# Patient Record
Sex: Female | Born: 1950 | Race: White | Hispanic: No | Marital: Married | Smoking: Former smoker
Health system: Southern US, Community
[De-identification: ages and names within clinical notes are randomized; demographics above are authoritative.]

## PROBLEM LIST (undated history)

## (undated) DIAGNOSIS — R3915 Urgency of urination: Secondary | ICD-10-CM

## (undated) DIAGNOSIS — IMO0001 Reserved for inherently not codable concepts without codable children: Secondary | ICD-10-CM

## (undated) DIAGNOSIS — M5136 Other intervertebral disc degeneration, lumbar region: Secondary | ICD-10-CM

## (undated) DIAGNOSIS — I10 Essential (primary) hypertension: Secondary | ICD-10-CM

## (undated) DIAGNOSIS — F419 Anxiety disorder, unspecified: Secondary | ICD-10-CM

## (undated) DIAGNOSIS — Z9989 Dependence on other enabling machines and devices: Secondary | ICD-10-CM

## (undated) DIAGNOSIS — D494 Neoplasm of unspecified behavior of bladder: Secondary | ICD-10-CM

## (undated) DIAGNOSIS — M51369 Other intervertebral disc degeneration, lumbar region without mention of lumbar back pain or lower extremity pain: Secondary | ICD-10-CM

## (undated) DIAGNOSIS — IMO0002 Reserved for concepts with insufficient information to code with codable children: Secondary | ICD-10-CM

## (undated) DIAGNOSIS — M1990 Unspecified osteoarthritis, unspecified site: Secondary | ICD-10-CM

## (undated) DIAGNOSIS — G4733 Obstructive sleep apnea (adult) (pediatric): Secondary | ICD-10-CM

## (undated) DIAGNOSIS — Z5189 Encounter for other specified aftercare: Secondary | ICD-10-CM

## (undated) HISTORY — PX: ANTERIOR CRUCIATE LIGAMENT REPAIR: SHX115

## (undated) HISTORY — PX: TONSILLECTOMY: SUR1361

## (undated) HISTORY — DX: Unspecified osteoarthritis, unspecified site: M19.90

## (undated) HISTORY — PX: GANGLION CYST EXCISION: SHX1691

## (undated) HISTORY — PX: TUBAL LIGATION: SHX77

## (undated) HISTORY — PX: DILATION AND CURETTAGE OF UTERUS: SHX78

## (undated) HISTORY — DX: Essential (primary) hypertension: I10

---

## 1992-10-14 HISTORY — PX: ABDOMINAL HYSTERECTOMY: SHX81

## 1998-12-13 ENCOUNTER — Other Ambulatory Visit: Admission: RE | Admit: 1998-12-13 | Discharge: 1998-12-13 | Payer: Self-pay | Admitting: Obstetrics and Gynecology

## 2002-04-23 ENCOUNTER — Encounter (INDEPENDENT_AMBULATORY_CARE_PROVIDER_SITE_OTHER): Payer: Self-pay | Admitting: *Deleted

## 2002-04-23 ENCOUNTER — Ambulatory Visit (HOSPITAL_COMMUNITY): Admission: RE | Admit: 2002-04-23 | Discharge: 2002-04-23 | Payer: Self-pay | Admitting: Gastroenterology

## 2002-11-11 ENCOUNTER — Other Ambulatory Visit: Admission: RE | Admit: 2002-11-11 | Discharge: 2002-11-11 | Payer: Self-pay | Admitting: Obstetrics and Gynecology

## 2004-06-04 ENCOUNTER — Observation Stay (HOSPITAL_COMMUNITY): Admission: EM | Admit: 2004-06-04 | Discharge: 2004-06-05 | Payer: Self-pay | Admitting: Emergency Medicine

## 2004-08-10 DIAGNOSIS — IMO0001 Reserved for inherently not codable concepts without codable children: Secondary | ICD-10-CM

## 2004-08-10 HISTORY — DX: Reserved for inherently not codable concepts without codable children: IMO0001

## 2006-02-20 ENCOUNTER — Ambulatory Visit (HOSPITAL_COMMUNITY): Admission: RE | Admit: 2006-02-20 | Discharge: 2006-02-20 | Payer: Self-pay | Admitting: Orthopedic Surgery

## 2006-02-20 HISTORY — PX: KNEE ARTHROSCOPY W/ MENISCECTOMY: SHX1879

## 2006-06-15 ENCOUNTER — Emergency Department (HOSPITAL_COMMUNITY): Admission: EM | Admit: 2006-06-15 | Discharge: 2006-06-15 | Payer: Self-pay | Admitting: Emergency Medicine

## 2007-09-16 ENCOUNTER — Inpatient Hospital Stay (HOSPITAL_COMMUNITY): Admission: RE | Admit: 2007-09-16 | Discharge: 2007-09-21 | Payer: Self-pay | Admitting: Orthopedic Surgery

## 2007-09-16 HISTORY — PX: TOTAL KNEE ARTHROPLASTY: SHX125

## 2008-12-23 ENCOUNTER — Ambulatory Visit (HOSPITAL_COMMUNITY): Admission: RE | Admit: 2008-12-23 | Discharge: 2008-12-24 | Payer: Self-pay | Admitting: Orthopedic Surgery

## 2008-12-23 HISTORY — PX: ROTATOR CUFF REPAIR: SHX139

## 2010-04-02 HISTORY — PX: KNEE ARTHROSCOPY: SUR90

## 2010-05-01 ENCOUNTER — Ambulatory Visit (HOSPITAL_COMMUNITY): Admission: RE | Admit: 2010-05-01 | Discharge: 2010-05-01 | Payer: Self-pay | Admitting: Orthopedic Surgery

## 2010-12-30 LAB — URINALYSIS, ROUTINE W REFLEX MICROSCOPIC
Bilirubin Urine: NEGATIVE
Hgb urine dipstick: NEGATIVE
Ketones, ur: NEGATIVE mg/dL
Nitrite: NEGATIVE
Urobilinogen, UA: 0.2 mg/dL (ref 0.0–1.0)

## 2010-12-30 LAB — COMPREHENSIVE METABOLIC PANEL
Albumin: 3.8 g/dL (ref 3.5–5.2)
CO2: 24 mEq/L (ref 19–32)
Calcium: 9.5 mg/dL (ref 8.4–10.5)
Chloride: 108 mEq/L (ref 96–112)
GFR calc Af Amer: >60 mL/min (ref >60)
Glucose, Bld: 119 mg/dL — ABNORMAL HIGH (ref 70–99)
Potassium: 3.7 mEq/L (ref 3.5–5.1)
Total Bilirubin: 0.2 mg/dL — ABNORMAL LOW (ref 0.3–1.2)
Total Protein: 6.6 g/dL (ref 6.0–8.3)

## 2010-12-30 LAB — CBC
HCT: 35.7 % — ABNORMAL LOW (ref 36.0–46.0)
MCH: 30.8 pg (ref 26.0–34.0)
MCV: 90.5 fL (ref 78.0–100.0)
RBC: 3.94 MIL/uL (ref 3.87–5.11)
RDW: 12.5 % (ref 11.5–15.5)

## 2010-12-30 LAB — PROTIME-INR: INR: 1 (ref 0.00–1.49)

## 2010-12-30 LAB — DIFFERENTIAL
Basophils Absolute: 0 10*3/uL (ref 0.0–0.1)
Basophils Relative: 0 % (ref 0–1)
Monocytes Absolute: 0.4 10*3/uL (ref 0.1–1.0)
Neutro Abs: 3.5 10*3/uL (ref 1.7–7.7)

## 2010-12-30 LAB — SURGICAL PCR SCREEN: MRSA, PCR: NEGATIVE

## 2011-01-24 ENCOUNTER — Emergency Department (INDEPENDENT_AMBULATORY_CARE_PROVIDER_SITE_OTHER): Payer: BC Managed Care – HMO

## 2011-01-24 ENCOUNTER — Emergency Department (HOSPITAL_BASED_OUTPATIENT_CLINIC_OR_DEPARTMENT_OTHER)
Admission: EM | Admit: 2011-01-24 | Discharge: 2011-01-24 | Disposition: A | Discharge status: Home / Self Care | Payer: BC Managed Care – HMO | Attending: Emergency Medicine | Admitting: Emergency Medicine

## 2011-01-24 DIAGNOSIS — Z8739 Personal history of other diseases of the musculoskeletal system and connective tissue: Secondary | ICD-10-CM | POA: Insufficient documentation

## 2011-01-24 DIAGNOSIS — M25519 Pain in unspecified shoulder: Secondary | ICD-10-CM | POA: Insufficient documentation

## 2011-01-24 DIAGNOSIS — I10 Essential (primary) hypertension: Secondary | ICD-10-CM | POA: Insufficient documentation

## 2011-01-24 DIAGNOSIS — X500XXA Overexertion from strenuous movement or load, initial encounter: Secondary | ICD-10-CM

## 2011-01-24 LAB — COMPREHENSIVE METABOLIC PANEL
ALT: 33 U/L (ref 0–35)
AST: 25 U/L (ref 0–37)
Chloride: 101 mEq/L (ref 96–112)
Creatinine, Ser: 0.47 mg/dL (ref 0.4–1.2)
Potassium: 3.9 mEq/L (ref 3.5–5.1)
Total Bilirubin: 0.7 mg/dL (ref 0.3–1.2)
Total Protein: 7.2 g/dL (ref 6.0–8.3)

## 2011-01-24 LAB — CBC
HCT: 37.6 % (ref 36.0–46.0)
Hemoglobin: 12.7 g/dL (ref 12.0–15.0)
MCV: 92.1 fL (ref 78.0–100.0)
RBC: 4.09 MIL/uL (ref 3.87–5.11)

## 2011-01-24 LAB — DIFFERENTIAL
Eosinophils Absolute: 0.1 10*3/uL (ref 0.0–0.7)
Eosinophils Relative: 1 % (ref 0–5)
Monocytes Absolute: 0.5 10*3/uL (ref 0.1–1.0)
Monocytes Relative: 7 % (ref 3–12)
Neutro Abs: 4.9 10*3/uL (ref 1.7–7.7)

## 2011-01-24 LAB — URINALYSIS, ROUTINE W REFLEX MICROSCOPIC
Hgb urine dipstick: NEGATIVE
Ketones, ur: NEGATIVE mg/dL
Nitrite: NEGATIVE
Urobilinogen, UA: 0.2 mg/dL (ref 0.0–1.0)
pH: 5.5 (ref 5.0–8.0)

## 2011-01-24 LAB — TYPE AND SCREEN
ABO/RH(D): A POS
Antibody Screen: NEGATIVE

## 2011-01-24 LAB — PROTIME-INR: Prothrombin Time: 12.9 seconds (ref 11.6–15.2)

## 2011-01-24 LAB — URINE CULTURE: Colony Count: 30000

## 2011-02-26 NOTE — Op Note (Signed)
[name], [name]                 ACCOUNT NO.:  [id number]   MEDICAL RECORD NO.:  [id number]          PATIENT TYPE:  INP   LOCATION:  0002                         FACILITY:  [facility]   PHYSICIAN:  Georges Lynch. Gioffre, M.D.DATE OF BIRTH:  08-13-1951   DATE OF PROCEDURE:  09/16/2007  DATE OF DISCHARGE:                               OPERATIVE REPORT   SURGEON:  Dr.  Darrelyn Hillock.   OPERATIONS:  Arlyn Leak, PA   PREOPERATIVE DIAGNOSES:  1. Severe degenerative arthritis of the left knee.  2. Previous Macintosh procedure, left knee.   POSTOPERATIVE DIAGNOSES:  1. Severe degenerative arthritis of the left knee.  2. Previous Macintosh procedure, left knee.   OPERATION:  Left total knee arthroplasty utilizing the DePuy system.  All three components were cemented and vancomycin was used in the  cement.  The sizes used was a patella of 32 mm diameter with three pegs.  The tibial tray was a size 2 tibial tray, the tibial insert was a size  2.5, 12.5 mm thickness, the femoral component was a size 2.5 left  posterior cruciate sacrificing type.   PROCEDURE:  Under general anesthesia, routine orthopedic prep and  draping of the left lower extremity was carried out.  The patient had 1  gram of Ancef IV preop.  The leg was exsanguinated with Esmarch after  sterile prep and draping.  At this time the tourniquet was elevated 350  mmHg.  The knee was flexed and incision was made over the anterior  aspect of the left knee and bleeders identified and cauterized.  At this  time I carried out a median parapatellar incision, reflecting patella  laterally, flexed the knee and did medial lateral meniscectomies.  I  removed the cruciate ligament at the same time.  Following that I  debrided the knee.  Drill holes made in the intercondylar notch and the  guide rod was inserted.  I then removed with a #1 jig 11 mm thickness  off the distal femur.  Following that, #2 jig was inserted for  measurement purposes.  I  measured the femur to be a size 2.5 femur.  Following that I then prepared the tibia.  I removed the appropriate  amount of bone from the tibia utilizing the medial side as a guide.  We  removed approximately 4 mm thickness off the tibia utilizing the medial  side of the guide.  We did utilize the intramedullary guide rod.  Following that our keel cut was made for a size two tibia.  After the  tibia was prepared, we went back and cut our notch cut out of the femur.  Obviously that was done after we did our anterior posterior chamfering  cuts of the femur.  Following that we thoroughly irrigated out the knee  and we removed the posterior spurs from the distal posterior femur and  great care was taken not to injure the underlying soft tissue structures  posteriorly.  We then inserted our trial components and finally selected  a 12.5-mm thickness tibial insert.  While the trial components were in  place  we then cut our patella did a resurfacing procedure on the patella  and we utilized a size 32 mm patella with three drill holes.  Following  that we removed all trial components.  Note prior to making our final  decision we did do the measurements for our extension and flexor flexion  gaps.  We had excellent position.  We then water picked out the knee,  dried the knee out inserted some Gelfoam into the distal femur as well  as the proximal tibia as the cement spacer and then inserted our  vancomycin impregnated cement and cemented all three components in  simultaneously.  Once the cement was hardened we searched for loose  pieces cement.  We removed the loose pieces.  We water picked out the  knee.  I then tried first a 10 mm thickness tibial insert and then the  12.5.  The 12.5 mm thickness gave [place] good extension and flexion as well  as lateral medial stability.  We then removed the trial component  inserted our permanent size 2.5 tibial tray, 12.5 mm thickness, reduced  the knee and had  excellent function.  We thoroughly irrigated knee out,  inserted our Hemovac drain.  Note prior to inserting our components.  I  did inject 20 mL of 0.25% Marcaine with Toradol and epinephrine into the  soft tissue structures.  We then closed the knee in layers over Hemovac  drain.  Sterile dressings were applied.  Postop the patient will be on  heparin Coumadin protocol until the Coumadin level, the INR is  satisfactory just to keep the patient on Coumadin after that.           ______________________________  Georges Lynch. Darrelyn Hillock, M.D.     RAG/MEDQ  D:  09/16/2007  T:  09/16/2007  Job:  [id number]

## 2011-02-26 NOTE — Op Note (Signed)
NAMEELMA, [name]                 ACCOUNT NO.:  [id number]   MEDICAL RECORD NO.:  [id number]          PATIENT TYPE:  AMB   LOCATION:  DAY                          FACILITY:  [facility]   PHYSICIAN:  Georges Lynch. Gioffre, M.D.DATE OF BIRTH:  October 06, 1951   DATE OF PROCEDURE:  12/23/2008  DATE OF DISCHARGE:                               OPERATIVE REPORT   SURGEON:  Georges Lynch. Darrelyn Hillock, M.D.   ASSISTANT:  Nurse.   PREOPERATIVE DIAGNOSES:  1. Complete tear of the rotator cuff tendon, right shoulder.  2. Severe impingement, right shoulder.   POSTOPERATIVE DIAGNOSES:  1. Complete tear of the rotator cuff tendon, right shoulder.  2. Severe impingement, right shoulder.   OPERATION:  1. Open acromionectomy and acromioplasty, right shoulder.  2. Repair of a central tear of the rotator cuff tendon, right      shoulder.  3. TissueMend graft to the right shoulder utilizing one PEEK anchor.   PROCEDURE:  Under general anesthesia the patient first had 1 gram of IV  Ancef.  Incision was made over the anterior aspect of the right  shoulder.  Bleeders were identified and cauterized.  Self-retaining  retractors were inserted.  I partially detached the deltoid tendon from  the acromion.  I exposed the acromion, protected the underlying rotator  cuff with the Bennett retractor and did a partial acromionectomy.  I  then utilized the bur and did a acromioplasty.  Once this was done I  completed the excision of the subdeltoid bursa.  Immediately upon  inspecting the rotator cuff she had a large tear centrally.  I then  utilized a bur to bur the lateral articular surface of the humerus.  Following that, I repaired the tear primarily with a #1 Ethibond suture,  followed by a TissueMend reinforcement graft with one PEEK anchor.  I  thoroughly irrigated out the area.  I inserted thrombin-soaked Gelfoam  and reapproximated the deltoid tendon muscle in the usual fashion.  The  subcu was closed with zero Vicryl,  skin with metal staples and a sterile  Neosporin dressing was applied.  The patient was placed in a shoulder  immobilizer.           ______________________________  Georges Lynch Darrelyn Hillock, M.D.     RAG/MEDQ  D:  12/23/2008  T:  12/23/2008  Job:  [id number]

## 2011-03-01 NOTE — H&P (Signed)
[name], [name]                 ACCOUNT NO.:  [id number]   MEDICAL RECORD NO.:  [id number]         PATIENT TYPE:  LINP   LOCATION:                               FACILITY:  [facility]   PHYSICIAN:  Georges Lynch. Gioffre, M.D.DATE OF BIRTH:  [birth year]   DATE OF ADMISSION:  09/16/2007  DATE OF DISCHARGE:                              HISTORY & PHYSICAL   CHIEF COMPLAINT:  Painful left knee.   HISTORY OF PRESENT ILLNESS:  [name] is a 60 year old female with  long-term evaluation and treatment of left knee pain.  The patient has  failed conservative treatment.  She has had an arthroscopy which shows  significant arthritic changes in her left knee.  X-ray shows that she  has significant loss of medial joint space, calcific chondrosis of  bilateral components.  The patient has elected to proceed with a total  knee arthroplasty.   PRIMARY CARE PHYSICIAN:  Dr. Wylene Simmer.   ALLERGIES:  SHE DENIES ANY MEDICATION ALLERGIES.   CURRENT MEDICATIONS:  Cozaar, Zoloft, Premarin, low-dose aspirin,  Ambien.   PRESENT MEDICAL HISTORY:  Includes anxiety which is well controlled with  Zoloft, hypertension with a history of a stress test 3 years previous,  which was normal.  History of a blood transfusions with previous  hysterectomy, with autologous blood donation, osteoarthritis.   REVIEW OF SYSTEMS:  Is negative for any neurologic other than her  anxiety, which is well controlled.  No pulmonary issues, no  cardiovascular issues other than some chest pains 3 years previous with  a negative stress test, chest pains related to stress.  She denies any  GI, GU issues.  No endocrine issues.   PAST SURGICAL HISTORY:  Includes a hysterectomy, two C sections, and  knee surgeries without any complications from anesthesia.   FAMILY MEDICAL HISTORY:  Mother is alive with a history of hypertension  and arthritis.  Father is deceased from complications of lung cancer,  with a history of hypertension and  diabetes.   SOCIAL HISTORY:  The patient is married.  She is currently employed  working as a Museum/gallery curator.  She does not smoke.  She has a couple of  glasses of wine, has two grown children.  She will be cared by her  husband and her mother and daughter, in a one-story house.   PHYSICAL EXAM:  VITAL SIGNS:  Height is 5 foot, 4 inches, weight is 160  pounds, blood pressure is 148/82.  Pulse of 74 and regular, respirations  12, patient is afebrile.  GENERAL:  This is a healthy-appearing female, conscious, alert and  appropriate and appears to be a good historian, walks with a slight  limp, with a slightly varus deformity of her left knee.  HEENT:  He was normal.  Pupils equally round and reactive.  Gross  hearing is intact.  NECK:  Supple, no palpable lymphadenopathy, excellent range of motion  without any discomfort.  CHEST:  Lung sounds were clear and equal bilaterally.  No wheezes, rales  or rhonchi.  HEART:  Regular rate and rhythm.  No murmurs, rubs or gallops.  ABDOMEN:  Soft.  Bowel sounds present.  No CVA region tenderness.  EXTREMITIES:  Upper extremities had excellent range of motion of her  shoulders, elbows and wrist.  Motor strength is 5/5.  LOWER EXTREMITIES:  Right and left hip had excellent range of motion,  without any discomfort.  Right knee had full extension.  She can flex it  back to 130, with no instability, no effusion, no signs of erythema or  ecchymosis.  Left knee had about 3 degrees short of full extension,  flexion back to 130 degrees.  She did have some laxity with valgus varus  stress, but no ligament instability.  The calf was soft, nontender.  Ankles were symmetrical with good dorsi plantar flexion.  PERIPHERAL VASCULAR:  She had good carotid pulses, no bruits.  Radial  pulses were 2+, dorsalis pedis pulses were 2+, no lower extremity edema.  NEURO:  The patient was conscious, alert and appropriate, appeared to be  good historian.  She had no gross  neurologic defects noted.  BREAST, RECTAL AND GU:  Exams were deferred at this time.  X-ray shows she has significant arthritic changes, bilateral  compartments loss of medial joint spaces, with some calcific chondrosis  changes.   IMPRESSION:  1. End-stage osteoarthritis, left knee.  2. Anxiety.  3. Hypertension.   PLAN:  The patient will undergo all routine labs and tests, prior to  having a left total knee arthroplasty by Dr. Darrelyn Hillock at [facility] on September 16, 2007.  This patient had all her questions  answered, and she has elected to proceed with a total knee arthroplasty.      Jamelle Rushing, P.A.    ______________________________  Georges Lynch Darrelyn Hillock, M.D.    RWK/MEDQ  D:  08/31/2007  T:  08/31/2007  Job:  [id number]   cc:   Windy Fast A. Darrelyn Hillock, M.D.  Fax: [phone]

## 2011-03-01 NOTE — H&P (Signed)
NAME:  [name], [name]                           ACCOUNT NO.:  [id number]   MEDICAL RECORD NO.:  [id number]                   PATIENT TYPE:  INP   LOCATION:  1826                                 FACILITY:  [facility]   PHYSICIAN:  Barry Dienes. Eloise Harman, M.D.            DATE OF BIRTH:  1951-06-02   DATE OF ADMISSION:  06/04/2004  DATE OF DISCHARGE:                                HISTORY & PHYSICAL   CHIEF COMPLAINT:  Chest pain.   HISTORY OF PRESENT ILLNESS:  The patient is a 60 year old white female with  chest pain that started today while doing housework at approximately 10 a.m.  The pain radiated to her left jaw and left inner arm.  It was not associated  with nausea, shortness of breath, or diaphoresis.  Her symptoms lasted  several hours so she presented to the emergency room and at approximately 3  p.m.  In the emergency room treatment was with oxygen and nitroglycerin has  been associated with intermittent relief of the chest discomfort and  moderate bifrontal headache.  Of note, this past weekend, she and her  husband traveled by car to [place] and back.   PAST MEDICAL HISTORY:  1. Significant for chronic left knee pain.  2. Overactive bladder.  3. Low back pain due to degenerative disease and mild chronic neck pain.   MEDICATIONS:  1. Detrol LA 4 mg p.o. daily.  2. Premarin 0.3 mg p.o. daily.  3. Xanax as needed.   ALLERGIES:  CODEINE has been associated with dysphoria.   PAST SURGICAL HISTORY:  1. In approximately 1994, total abdominal hysterectomy.  2. Remote left knee open procedure with two arthroscopies on the left knee.  3. She has also had a left wrist ganglion cyst removal.   FAMILY HISTORY:  Father died at age 77 of lung carcinoma.  He had a history  of hypertension and diabetes mellitus type 2 with a myocardial infarction at  age 66.  Mother is alive and well at age 84 and has problems with  hypertension and hypoglycemia.  She has a brother, age 46, who has  diabetes  mellitus type 2 and hypertension.   SOCIAL HISTORY:  She is married and has two children.  She has a remote  history of tobacco use, stopped 24 hours ago and no history of alcohol  abuse.  She works at [employer].   REVIEW OF SYMPTOMS:  Significant for mild intermittent neck pain and mild  chronic left knee pain.  She denies fever, chills, shortness of breath,  cough, nausea, vomiting, constipation, diarrhea, anxiety, depression, or a  history of gastroesophageal reflux disease.   PHYSICAL EXAMINATION:  VITAL SIGNS:  Blood pressure 139/74, pulse 70,  respirations 20, temperature 99.2.  Pulse oximetry 97% on room air.  GENERAL:  She is a well-nourished, well-developed white female who is in no  apparent distress.  She is alert and  oriented x3.  HEENT:  Normal.  CHEST:  Clear to auscultation.  HEART:  The heart had a regular rate and rhythm.  S1 and S2 are present  without murmur, gallop or rub.  ABDOMEN: Had normal bowel sounds with no hepatosplenomegaly or tenderness.  EXTREMITIES:  Without cyanosis, clubbing or edema.  NEUROLOGIC:  Nonfocal.   LABORATORY DATA:  WBC 6.2, hemoglobin 12, hematocrit 36, platelet count 224.  Serum sodium 139, potassium 3.9, chloride 101, CO2 27, BUN 12, creatinine  0.5, glucose 96, serum albumin 4.1.  Her EKG showed normal sinus rhythm,  within normal limits.  Chest x-ray (preliminary report) showed no acute  cardiopulmonary disease.  Serum myoglobin levels have been 27 and 22.5 with  troponin-I levels 0.05 and less than 0.05.  CKMB levels have been 1.6, 1.5  and 2.4.   IMPRESSION AND PLAN:  Fever and chest pain.  Her symptoms are somewhat  typical for new onset coronary artery disease.  However, generally cardiac  chest discomfort would improve with IV nitroglycerin treatment, which she  has not shown.  The long car trip, chronic Premarin treatment puts her at  risk for a deep venous thrombosis or pulmonary  embolus.  I doubt that her  symptoms are due to gastroesophageal reflux disease or cholelithiasis.  I  plan to check a fibrin degradation products level which, if normal, argues  strongly against pulmonary embolism or deep venous thrombosis.  I plan to  recheck CPK level and troponin-I level in the a.m. and continue IV heparin  and IV nitroglycerin as needed.  A deep venous thrombosis ultrasound test  will be checked tomorrow a.m.  If all of her cardiac isoenzymes are normal,  she should be considered for outpatient cardiac risk stratification.                                                Barry Dienes Eloise Harman, M.D.    DGP/MEDQ  D:  06/04/2004  T:  06/05/2004  Job:  [id number]   cc:   Gaspar Garbe, M.D.  [address]  [place]  [place] [address]  Fax: [phone]

## 2011-03-01 NOTE — Discharge Summary (Signed)
[name], [name]                 ACCOUNT NO.:  [id number]   MEDICAL RECORD NO.:  [id number]          PATIENT TYPE:  INP   LOCATION:  1605                         [facility]ACILITY:  [facility]   PHYSICIAN:  Georges Lynch. Gioffre, M.D.DATE OF BIRTH:  May 04, 1951   DATE OF ADMISSION:  09/16/2007  DATE OF DISCHARGE:  09/21/2007                               DISCHARGE SUMMARY   ADMISSION DIAGNOSIS:  1. Painful left knee with osteoarthritis.  2. Anxiety.  3. Hypertension.   DISCHARGE DIAGNOSIS:  1. Left total knee arthroplasty.  2. Postoperative blood loss anemia, requiring 2 units of packed red      blood cells, transfused without complications.  3. Postoperative intermittent temperatures, etiology unknown.  4. History of anxiety.  5. History of hypertension.   HISTORY OF PRESENTING ILLNESS:  The patient is 60 year old female with  long-term evaluation/treatment of left knee pain.  The patient has  failed conservative treatment.  She has arthroscopy that shows arthritic  changes in the left knee.  X-ray showed a loss of medial joint spacing.  The patient would like to proceed with a total knee arthroplasty.   ALLERGIES:  The patient denied any allergies to medicines.   MEDICATIONS ON ADMISSION:  Cozaar, Zoloft, Premarin, low-dose aspirin,  and Ambien.   SURGICAL PROCEDURES:  On September 17, 2007 the patient was taken to the  OR by Dr. Ranee Gosselin assisted by Jamelle Rushing, PA-C.  Under  general anesthesia the patient underwent a left total knee arthroplasty  with a __________  rotating platform system.  There were no  complications.  There was minimal blood loss.  The patient was  transferred to the recovery room and then to the orthopedic floor in  good condition.  The patient had the following components implanted.  A  size 2.5 femoral component, a size 2 keel tibial tray, a 2.5 bearing and  a 12.5 thickness, a size 32 three-peg patella.  All components were  implanted with methacrylate  with vancomycin mixed in.   CONSULTS:  The following routine consults were requested physical  therapy, occupational therapy, case management, and pharmacy.   HOSPITAL COURSE:  On September 16, 2007 the patient was admitted to [facility] under the care of Dr. Ranee Gosselin.  The patient was  taken to the OR where a left total knee arthroplasty was performed  without any complications.  The patient tolerated the procedure well and  was transferred to the recovery room, and then to the orthopedic floor  in good condition.  IV pain medicines, IV antibiotics, and Coumadin and  heparin for DVT prophylaxis to follow a total knee protocol.  The  patient did develop some postoperative blood loss anemia with hemoglobin  dropping into the 7's, so she was typed-and-crossed, and transfused 2  units of packed red blood cells.  She did receive this without any  significant untoward event.   The patient also developed these intermittent temperatures throughout  her hospital stable both before and during her blood transfusion.  Her  wound remained benign for any signs of infection.  Her  leg remained  neuromotor sensory intact, and there was no other significant source of  these postop temps.   The patient did very well with physical therapy.  She was able to  transition off the IV medications and p.o. meds as well; and it was felt  that on postop day #4, she was medically and orthopedically stable,  ready for transfer/discharge home.  She was discharged home on routine  total knee protocol follow up with Coumadin and on antibiotics just for  protective purposes for a few days.   LABS:  CBC on admission found WBC of 7.6, hemoglobin 13.2, hematocrit of  38.1, platelets 290.  H&H dropped down to 7.8/22.2 but with two units of  packed red blood cells improved to 9.9/27.9.  The patient did not have a  transfusion-type reaction.  The patient's INR at discharge was 1.7.  Routine chemistries on  admission were normal.  Estimated GFR was greater  than 60.  The patient received a total of 2 units of packed red blood  cells.  EKG on admission was normal sinus rhythm at 80 beats per minute.   DISCHARGE INSTRUCTIONS:  1. Diet -- the patient is to resume normal diet.  2. Activity -- the patient is to increase activity slowly with the use      of walker.  She may shower.  3. Wound care -- the patient is to change her dressing daily.  4. Followup -- the patient needs a follow up appoint with Dr. Darrelyn Hillock      this coming Thursday in the office.  5. Home health care provided through [facility] for routine total knee      protocol and Coumadin monitoring.   MEDICATIONS:  1. Coumadin 5 mg a day unless changed by the pharmacy.  2. Robaxin 500 mg 1 tablet q.8 h. for muscle spasms if needed.  3. Vicodin 1 or 2 tablets q.4-6 h. for pain if needed.  4. Keflex 500 mg 1 tablet four times a day until gone.  5. Cozaar 50 mg once a day.  6. Premarin 0.45 mg once a day.  7. Sertraline 50 mg once a day.  8. Ambien 1 tablet at bedtime if needed.   The patient's condition upon discharge home is improved and good.      Jamelle Rushing, P.A.    ______________________________  Georges Lynch Darrelyn Hillock, M.D.    RWK/MEDQ  D:  09/30/2007  T:  09/30/2007  Job:  [id number]   cc:   Windy Fast A. Darrelyn Hillock, M.D.  Fax: [phone]

## 2011-03-01 NOTE — Op Note (Signed)
[name], [name]                 ACCOUNT NO.:  [id number]   MEDICAL RECORD NO.:  [id number]          PATIENT TYPE:  AMB   LOCATION:  DAY                          FACILITY:  [facility]   PHYSICIAN:  Ronald A. Gioffre, M.D.DATE OF BIRTH:  13-Jan-1951   DATE OF PROCEDURE:  02/20/2006  DATE OF DISCHARGE:                                 OPERATIVE REPORT   SURGEON:  Georges Lynch. Darrelyn Hillock, M.D.   ASSISTANT:  Nurse.   PREOP DIAGNOSIS:  1.  Degenerative arthritis severe, right knee.  2.  Torn medial meniscus, right knee.  3.  Torn lateral meniscus, right knee.   POSTOP DIAGNOSIS:  1.  Degenerative arthritis severe, right knee.  2.  Torn medial meniscus, right knee.  3.  Torn lateral meniscus, right knee.   OPERATION:  1.  Diagnostic arthroscopy right knee.  2.  Medial meniscectomy right knee posterior horn.  3.  Partial lateral meniscectomy right knee.  4.  Abrasion chondroplasty patella.  5.  Abrasion chondroplasty to medial tibial plateau right knee.  6.  Abrasion chondroplasty medial femoral condyle right knee.   PROCEDURE:  Under general anesthesia, routine orthopedic prep and draping  the right lower extremity carried out.  She had 1 gram of IV Ancef.  At this  time small punctate incision made suprapatellar pouch, inflow count was  inserted, knee was distended with saline.  Another small punctate incisions  made in the anterolateral joint, arthroscope was entered lateral approach.  Complete diagnostic arthroscopy was carried out.  She had rather severe  arthritis in her knee.  I introduced shaver suction device and did a  synovectomy suprapatellar pouch and an abrasion chondroplasty of the  patella.  Went down the medial joint space did abrasion chondroplasty tibial  plateau medially and the femoral condyle medially.  Her arthritis was very  extensive in the medial joint.  I also did a partial medial meniscectomy the  posterior horn of the medial meniscus.  The cruciates were intact.   Lateral  joint showed some tears in the lateral meniscus, I introduced the shaver  suction device and did a partial lateral meniscectomy.  I also did a  synovectomy in this side as well.  Thoroughly irrigated out the area.  Her  main problem is degenerative arthritis of the knee.  She eventually will  need total knee arthroplasty.  Thoroughly irrigated out the knee, removed  the fluid, closed all three punctate incisions with 3-0 nylon suture.  I  injected 30 mL 0.5% Marcaine epinephrine __________  was applied.  The patient left the operative satisfactory condition. Postop  she will be on aspirin 325 mg, should be on crutches, partial to full  weightbearing as tolerated.  She will see me in the office in 10-12 days or  prior if she has any problems.           ______________________________  Georges Lynch Darrelyn Hillock, M.D.     RAG/MEDQ  D:  02/20/2006  T:  02/21/2006  Job:  [id number]

## 2011-03-25 ENCOUNTER — Ambulatory Visit (HOSPITAL_BASED_OUTPATIENT_CLINIC_OR_DEPARTMENT_OTHER)
Admission: RE | Admit: 2011-03-25 | Discharge: 2011-03-26 | Disposition: A | Discharge status: Home / Self Care | Payer: BC Managed Care – HMO | Source: Ambulatory Visit | Attending: Orthopedic Surgery | Admitting: Orthopedic Surgery

## 2011-03-25 DIAGNOSIS — G4733 Obstructive sleep apnea (adult) (pediatric): Secondary | ICD-10-CM | POA: Insufficient documentation

## 2011-03-25 DIAGNOSIS — Z01810 Encounter for preprocedural cardiovascular examination: Secondary | ICD-10-CM | POA: Insufficient documentation

## 2011-03-25 DIAGNOSIS — M24119 Other articular cartilage disorders, unspecified shoulder: Secondary | ICD-10-CM | POA: Insufficient documentation

## 2011-03-25 DIAGNOSIS — I10 Essential (primary) hypertension: Secondary | ICD-10-CM | POA: Insufficient documentation

## 2011-03-25 DIAGNOSIS — F329 Major depressive disorder, single episode, unspecified: Secondary | ICD-10-CM | POA: Insufficient documentation

## 2011-03-25 DIAGNOSIS — M19019 Primary osteoarthritis, unspecified shoulder: Secondary | ICD-10-CM | POA: Insufficient documentation

## 2011-03-25 DIAGNOSIS — F3289 Other specified depressive episodes: Secondary | ICD-10-CM | POA: Insufficient documentation

## 2011-03-25 DIAGNOSIS — M719 Bursopathy, unspecified: Secondary | ICD-10-CM | POA: Insufficient documentation

## 2011-03-25 DIAGNOSIS — M67919 Unspecified disorder of synovium and tendon, unspecified shoulder: Secondary | ICD-10-CM | POA: Insufficient documentation

## 2011-03-25 HISTORY — PX: OTHER SURGICAL HISTORY: SHX169

## 2011-03-25 LAB — POCT I-STAT, CHEM 8
Calcium, Ion: 1.29 mmol/L (ref 1.12–1.32)
Chloride: 107 mEq/L (ref 96–112)
Creatinine, Ser: 0.7 mg/dL (ref 0.4–1.2)
Potassium: 3.9 mEq/L (ref 3.5–5.1)
Sodium: 141 mEq/L (ref 135–145)

## 2011-03-27 NOTE — Op Note (Signed)
NAMECATHERENE, [name]                 ACCOUNT NO.:  [id number]  MEDICAL RECORD NO.:  [id number]  LOCATION:                                 FACILITY:  PHYSICIAN:  Marlowe Kays, M.D.  DATE OF BIRTH:  February 08, 1951  DATE OF PROCEDURE:  03/25/2011 DATE OF DISCHARGE:                              OPERATIVE REPORT   PREOPERATIVE DIAGNOSES: 1. Partial rotator cuff tear, left shoulder. 2. Labral degenerative tear, left shoulder 3. Osteoarthritis, acromioclavicular joint, left shoulder.  POSTOPERATIVE DIAGNOSES: 1. Partial rotator cuff tear, left shoulder. 2. Labral degenerative tear, left shoulder 3. Osteoarthritis, acromioclavicular joint, left shoulder.  PROCEDURE: 1. Left shoulder arthroscopy with debridement of labrum and shaving of     the undersurface of the rotator cuff. 2. Arthroscopic subacromial decompression and shaving of the bursal     surface of the rotator cuff. 3. Open distal clavicle resection.  SURGEON:  Marlowe Kays, M.D.  ASSISTANTDruscilla Brownie. Cherlynn June.  ANESTHESIA:  General.  PATHOLOGICAL JUSTIFICATION FOR PROCEDURE:  Due to painful left shoulder, she had an MRI performed on February 03, 2009, which demonstrated the above preoperative diagnoses.  Accordingly, she is here for the above- mentioned surgery.  DESCRIPTION OF PROCEDURE:  Prophylactic antibiotics, satisfied general anesthesia, beach chair position and a sliding frame, left shoulder girdle was prepped with DuraPrep and draped in sterile field, timeout performed, anatomy of the shoulder joint was marked out.  She had a preoperative interscalene block, but I infiltrated the portals laterally and posteriorly and the subacromial space with Marcaine with adrenaline for hemostatic purposes.  Through posterior soft spot portal, I atraumatically entered the glenohumeral joint with a finding of degenerative tear of the posterior labrum and little bit of fraying of the undersurface of the rotator cuff  as anticipated on the MRI adjacent to its attachment to the humerus.  Accordingly, I advanced the scope between the subscapularis and the biceps tendon and using a switching stick, made an anterior incision.  Over the switching stick, I placed a metal cannula followed by 4.2 shaver into the joint and shaved down the degenerated labrum and the undersurface of the rotator cuff with postop pictures being taken.  I then redirected the scope to the subacromial space and through a lateral portal, I introduced a 4.2 shaver.  She had a good bit of bursal tissue, which I pictured and resected with the 4.2 shaver.  I followed this with the 90-degree ArthroCare vaporizer, removing soft tissue from the undersurface of the acromion.  I then followed this with the 4-mm oval bur burring down the undersurface of the acromion back to the [facility] joint, which was identified.  Pre and post decompression films were taken.  Lastly, I then made an open incision on the distal clavicle and with subperiosteal dissection, isolated the [facility] joint and the distal clavicle, measured off 1.5 cm from the [facility] joint and protecting the undersurface of the clavicle.  I used a microsaw to make an osteotomy, removing the cut portion with towel clip and cautery technique.  I then removed some small areas of bone fragments from the undersurface of the apparent clavicle, which I then covered with bone  wax and filled the resection space with Gelfoam.  I then closed the wound with interrupted #1 Vicryl in the fascia, 2-0 Vicryl in subcutaneous tissue, and Steri-Strips on the skin with 4-0 nylon in the 4 portals.  Dry sterile dressing and shoulder immobilizer were applied. She tolerated the procedure well, at time of this dictation was on her way to recovery room in satisfactory condition with no known complications.          ______________________________ Marlowe Kays, M.D.     JA/MEDQ  D:  03/25/2011  T:  03/25/2011  Job:   [id number]  Electronically Signed by Marlowe Kays M.D. on 03/27/2011 12:56:57 PM

## 2011-06-12 ENCOUNTER — Other Ambulatory Visit: Payer: Self-pay

## 2011-06-26 ENCOUNTER — Encounter (INDEPENDENT_AMBULATORY_CARE_PROVIDER_SITE_OTHER): Payer: Self-pay | Admitting: General Surgery

## 2011-06-26 ENCOUNTER — Ambulatory Visit (INDEPENDENT_AMBULATORY_CARE_PROVIDER_SITE_OTHER): Payer: BC Managed Care – HMO | Admitting: General Surgery

## 2011-06-26 VITALS — BP 116/70 | HR 78 | Temp 97.1°F | Ht 63.75 in | Wt 148.2 lb

## 2011-06-26 DIAGNOSIS — N63 Unspecified lump in unspecified breast: Secondary | ICD-10-CM | POA: Insufficient documentation

## 2011-06-26 DIAGNOSIS — R928 Other abnormal and inconclusive findings on diagnostic imaging of breast: Secondary | ICD-10-CM

## 2011-06-26 NOTE — Progress Notes (Signed)
Chief Complaint  Patient presents with  . Other    Eval of left breast radial scar, recommended to be excised    HPI [name] is a 60 y.o. female.   HPI This is a 60 year old female with no prior breast history who presented for her regular routine screening mammogram August 27. She had no complaints of any breast masses, tenderness, or nipple discharge. She underwent screening mammogram which showed a left breast upper outer quadrant area of microcalcifications. This was followed up by spot compression views showing about a 1.2 cm area of calcifications. Ultrasound did not demonstrate any abnormality in this region. Following this she underwent a stereotactic biopsy of this area with clip placement. 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 She has a family history of breast cancer in her father her sister. Past Medical History  Diagnosis Date  . Arthritis   . Hypertension   . Family history of breast cancer     aunt - fathers side  . Family history of colon cancer     mother, brother, aunt - mother's side  . Family history of lung cancer     father    Past Surgical History  Procedure Date  . Rotator cuff surgery 2011, 2012    right in 2011, left in 2012  . Joint replacement 09/16/2007    left  . Knee arthroscopy 03/2010    right  . Ganglion cyst excision     Family History  Problem Relation Age of Onset  . Cancer Mother     colon  . Cancer Father     lung    Social History History  Substance Use Topics  . Smoking status: Former Smoker    Quit date: 06/25/1980  . Smokeless tobacco: Never Used  . Alcohol Use: Yes     one glass a day    No Known Allergies  Current Outpatient Prescriptions  Medication Sig Dispense Refill  . amLODipine-valsartan (EXFORGE) 10-320 MG per tablet Take 1 tablet by mouth daily.        Marland Kitchen buPROPion (WELLBUTRIN XL) 300 MG 24 hr tablet Take 300 mg by mouth daily.        . celecoxib  (CELEBREX) 200 MG capsule Take 200 mg by mouth daily.        . Cholecalciferol (VITAMIN D3) 2000 UNITS TABS Take by mouth daily.        Marland Kitchen estrogens, conjugated, (PREMARIN) 0.45 MG tablet Take 0.45 mg by mouth daily. Take daily for 21 days then do not take for 7 days.       Marland Kitchen zolpidem (AMBIEN CR) 12.5 MG CR tablet Take 12.5 mg by mouth at bedtime as needed.          Review of Systems Review of Systems  Constitutional: Negative.   Musculoskeletal: Positive for arthralgias.  All other systems reviewed and are negative.    Blood pressure 116/70, pulse 78, temperature 97.1 F (36.2 C), temperature source Temporal, height 5' 3.75" (1.619 m), weight 148 lb 3.2 oz (67.223 kg).  Physical Exam Physical Exam  Constitutional: She appears well-developed and well-nourished.  Eyes: No scleral icterus.  Neck: Neck supple.  Cardiovascular: Normal rate, regular rhythm and normal heart sounds.   Pulmonary/Chest: Effort normal and breath sounds normal. She has no wheezes. She has no rales. Right breast exhibits no inverted nipple, no mass, no nipple discharge, no skin change and no tenderness. Left breast exhibits no inverted  nipple, no mass, no nipple discharge, no skin change and no tenderness. Breasts are symmetrical.  Lymphadenopathy:    She has no cervical adenopathy.      Assessment    Left breast radial sclerosing lesion    Plan    We discussed the findings on core biopsy. We discussed the rationale for a wire-guided excisional biopsy to ensure there is no carcinoma associated with his radial sclerosing lesion. I discussed a wire-guided biopsy with the risks and benefits associated with that. We will plan on proceeding as soon as possible.       [name] 06/26/2011, 3:31 PM

## 2011-07-08 ENCOUNTER — Encounter (HOSPITAL_BASED_OUTPATIENT_CLINIC_OR_DEPARTMENT_OTHER)
Admission: RE | Admit: 2011-07-08 | Discharge: 2011-07-08 | Disposition: A | Discharge status: Home / Self Care | Payer: BC Managed Care – HMO | Source: Ambulatory Visit | Attending: General Surgery | Admitting: General Surgery

## 2011-07-08 LAB — CBC
HCT: 35.7 % — ABNORMAL LOW (ref 36.0–46.0)
Platelets: ADEQUATE 10*3/uL (ref 150–400)
RBC: 4.03 MIL/uL (ref 3.87–5.11)
RDW: 12.1 % (ref 11.5–15.5)
WBC: 5.8 10*3/uL (ref 4.0–10.5)

## 2011-07-08 LAB — DIFFERENTIAL
Basophils Relative: 0 % (ref 0–1)
Eosinophils Absolute: 0.1 10*3/uL (ref 0.0–0.7)
Eosinophils Relative: 1 % (ref 0–5)
Lymphocytes Relative: 19 % (ref 12–46)
Monocytes Absolute: 0.6 10*3/uL (ref 0.1–1.0)
Neutrophils Relative %: 70 % (ref 43–77)
Smear Review: ADEQUATE

## 2011-07-08 LAB — BASIC METABOLIC PANEL
CO2: 30 mEq/L (ref 19–32)
Chloride: 103 mEq/L (ref 96–112)
GFR calc Af Amer: >60 mL/min (ref >60)
Potassium: 4.8 mEq/L (ref 3.5–5.1)

## 2011-07-10 ENCOUNTER — Ambulatory Visit (HOSPITAL_BASED_OUTPATIENT_CLINIC_OR_DEPARTMENT_OTHER)
Admission: RE | Admit: 2011-07-10 | Discharge: 2011-07-10 | Disposition: A | Discharge status: Home / Self Care | Payer: BC Managed Care – HMO | Source: Ambulatory Visit | Attending: General Surgery | Admitting: General Surgery

## 2011-07-10 ENCOUNTER — Other Ambulatory Visit (INDEPENDENT_AMBULATORY_CARE_PROVIDER_SITE_OTHER): Payer: Self-pay | Admitting: General Surgery

## 2011-07-10 DIAGNOSIS — Z01812 Encounter for preprocedural laboratory examination: Secondary | ICD-10-CM | POA: Insufficient documentation

## 2011-07-10 DIAGNOSIS — J449 Chronic obstructive pulmonary disease, unspecified: Secondary | ICD-10-CM | POA: Insufficient documentation

## 2011-07-10 DIAGNOSIS — D249 Benign neoplasm of unspecified breast: Secondary | ICD-10-CM

## 2011-07-10 DIAGNOSIS — J4489 Other specified chronic obstructive pulmonary disease: Secondary | ICD-10-CM | POA: Insufficient documentation

## 2011-07-10 DIAGNOSIS — N6019 Diffuse cystic mastopathy of unspecified breast: Secondary | ICD-10-CM

## 2011-07-10 DIAGNOSIS — I10 Essential (primary) hypertension: Secondary | ICD-10-CM | POA: Insufficient documentation

## 2011-07-10 DIAGNOSIS — G4733 Obstructive sleep apnea (adult) (pediatric): Secondary | ICD-10-CM | POA: Insufficient documentation

## 2011-07-10 DIAGNOSIS — Z803 Family history of malignant neoplasm of breast: Secondary | ICD-10-CM | POA: Insufficient documentation

## 2011-07-10 HISTORY — PX: BREAST SURGERY: SHX581

## 2011-07-11 NOTE — Op Note (Signed)
  NAMEWILLELLA, [name]                 ACCOUNT NO.:  [id number]  MEDICAL RECORD NO.:  [id number]  LOCATION:                                 FACILITY:  PHYSICIAN:  Juanetta Gosling, MDDATE OF BIRTH:  October 20, 1950  DATE OF PROCEDURE:  07/10/2011 DATE OF DISCHARGE:                              OPERATIVE REPORT   PREOPERATIVE DIAGNOSIS:  Left breast radial scar, on core biopsy.  POSTOPERATIVE DIAGNOSIS:  Left breast radial scar, on core biopsy.  PROCEDURE:  Left breast wire localization biopsy.  SURGEON:  Juanetta Gosling, MD.  ASSISTANT:  None.  ANESTHESIA:  General.  SUPERVISING ANESTHESIOLOGIST:  Germaine Pomfret, MD.  SPECIMENS:  Left breast tissue marked with short stitch superior, long stitch lateral, double stitch deep.  ESTIMATED BLOOD LOSS:  Minimal.  COMPLICATIONS:  None.  DRAINS:  None.  DISPOSITION:  The patient to recovery room in stable condition.  INDICATIONS:  This is a 60 year old female on a routine screening mammogram, had some left breast microcalcifications noted.  She underwent core biopsy which showed a fibroadenoma with the edge of it showing a radial sclerosing lesion.  She was then referred for excisional biopsy of these calcifications.  We discussed the indications, risks, and benefits associated with that procedure.  After informed consent was obtained, the patient [facility] taken [facility] where she had a wire placed to the lesion.  I had the mammograms available for my review during the operation.  She was then administered 1 gram of intravenous cefazolin.  Sequential compression devices were placed on lower extremities prior to induction with anesthesia.  She was then placed under general anesthesia without complication.  Her left breast was prepped and draped in a standard sterile surgical fashion. Surgical time-out was then performed.  PROCEDURE: I made a radial incision overlying the wire for about halfway down its tract.   I then used cautery to excise the wire in the surrounding breast tissue.  I then did a Faxitron mammogram, this confirmed removal of the lesion as well as the clip that had been left in from the prior biopsy. This was confirmed by Dr. Margy Clarks.  I then obtained hemostasis.  I closed this in multiple layers of 2-0 Vicryl, 3-0 Vicryl, and 4-0 for a subcuticular layer.  Steri-Strips and sterile dressing were placed.  I infiltrated 10 mL of 0.25% Marcaine throughout the area of operation. She tolerated this well and was transferred to recovery room in stable condition.     Juanetta Gosling, MD     MCW/MEDQ  D:  07/10/2011  T:  07/10/2011  Job:  [id number]  cc:   Soyla Murphy. Renne Crigler, M.D.  Electronically Signed by Emelia Loron MD on 07/11/2011 05:03:28 PM

## 2011-07-16 ENCOUNTER — Telehealth (INDEPENDENT_AMBULATORY_CARE_PROVIDER_SITE_OTHER): Payer: Self-pay | Admitting: General Surgery

## 2011-07-16 ENCOUNTER — Telehealth (INDEPENDENT_AMBULATORY_CARE_PROVIDER_SITE_OTHER): Payer: Self-pay

## 2011-07-16 NOTE — Telephone Encounter (Signed)
Called pt to notify her that we received her path report and it was no malignancy identified per Dr Dwain Sarna. We made the pt a follow up appt.[name]

## 2011-07-16 NOTE — Telephone Encounter (Signed)
error 

## 2011-07-22 LAB — HEMOGLOBIN AND HEMATOCRIT, BLOOD
HCT: 22.2 — ABNORMAL LOW
HCT: 25.4 — ABNORMAL LOW
HCT: 27.9 — ABNORMAL LOW
HCT: 35.1 — ABNORMAL LOW
Hemoglobin: 7.8 — CL
Hemoglobin: 8.1 — ABNORMAL LOW
Hemoglobin: 8.9 — ABNORMAL LOW
Hemoglobin: 9.9 — ABNORMAL LOW

## 2011-07-22 LAB — URINALYSIS, ROUTINE W REFLEX MICROSCOPIC
Glucose, UA: NEGATIVE
Hgb urine dipstick: NEGATIVE
Ketones, ur: NEGATIVE
pH: 7

## 2011-07-22 LAB — PROTIME-INR
INR: 1.7 — ABNORMAL HIGH
INR: 1.9 — ABNORMAL HIGH
INR: 2.1 — ABNORMAL HIGH
Prothrombin Time: 20.7 — ABNORMAL HIGH
Prothrombin Time: 23.8 — ABNORMAL HIGH

## 2011-07-22 LAB — CROSSMATCH: ABO/RH(D): A POS

## 2011-07-22 LAB — COMPREHENSIVE METABOLIC PANEL
Alkaline Phosphatase: 98
BUN: 10
Chloride: 104
GFR calc non Af Amer: >60
Glucose, Bld: 94
Potassium: 3.7
Total Bilirubin: 0.9

## 2011-07-22 LAB — DIFFERENTIAL
Basophils Absolute: 0
Basophils Relative: 0
Eosinophils Absolute: 0 — ABNORMAL LOW
Monocytes Absolute: 0.5
Neutro Abs: 5.7
Neutrophils Relative %: 75

## 2011-07-22 LAB — APTT: aPTT: 27

## 2011-07-22 LAB — CBC
HCT: 38.1
Hemoglobin: 13.2
MCV: 88.7
WBC: 7.6

## 2011-07-22 LAB — ABO/RH: ABO/RH(D): A POS

## 2011-07-24 ENCOUNTER — Encounter: Payer: Self-pay | Admitting: General Surgery

## 2011-07-29 ENCOUNTER — Ambulatory Visit (INDEPENDENT_AMBULATORY_CARE_PROVIDER_SITE_OTHER): Payer: BC Managed Care – PPO | Admitting: General Surgery

## 2011-07-29 ENCOUNTER — Encounter (INDEPENDENT_AMBULATORY_CARE_PROVIDER_SITE_OTHER): Payer: Self-pay | Admitting: General Surgery

## 2011-07-29 VITALS — BP 110/68 | HR 70 | Temp 97.1°F | Resp 16 | Ht 63.0 in | Wt 147.6 lb

## 2011-07-29 DIAGNOSIS — Z09 Encounter for follow-up examination after completed treatment for conditions other than malignant neoplasm: Secondary | ICD-10-CM

## 2011-07-29 NOTE — Patient Instructions (Signed)
Breast Problems and Self Exam Completing monthly breast exams may pick up problems early and save lives. There can be numerous causes of swelling, tenderness or lumps in the breasts. Some of these causes are:   Fibrocystic breast syndrome (noncancerous lumps). This is the most common cause of lumps in the breast.   Fibroadenoma breast tumors of unknown cause. These are noncancerous (benign) lumps.   Benign fatty tumors (lipomas).   Cancer of the breast.  By doing monthly breast exams, you get to know how your breasts feel and how they can change from month to month. This allows you to notice changes early. It can also offer you some reassurance that your breast health is good.  BREAST SELF EXAM There are a few points to follow when doing a thorough breast exam. The best time to examine your breasts is 5 to 7 days after your menstrual period is over. During menstruation, the breasts are lumpier, and it may be more difficult to pick up changes. If you do not menstruate, have reached menopause, or had a hysterectomy (uterus removal), examine your breasts the first day of every month. After 3 to 4 months, you will become more familiar with the variations of your breasts and more comfortable with the exam.  Perform your breast exam monthly. Keep a written record with breast changes or normal findings for each breast. This makes it easier to be sure of changes, so you do not need to depend only on memory for size, tenderness, or location. Try to do the exam at the same time each month, and write down where you are in your menstrual cycle, if you are still menstruating.   Look at your breasts. Stand in front of a mirror with your hands clasped behind your head. Tighten your chest muscles and look for asymmetry. This means a difference in shape or contour from one breast to the other, such as puckers, dips or bumps. Also, look for skin changes.    Lean forward with your hands on your hips. Again, look for  symmetry and skin changes.   While showering, soap the breasts. Then, carefully feel the breasts with your fingertips, while holding the other arm (on the side of the breast you are examining) over your head. Do this with each breast, carefully feeling for lumps or changes. Typically, a circular motion with moderate fingertip pressure should be used.    Repeat this exam while lying on your back. Put your arm behind your head and a pillow under your shoulders. Again, use your fingertips to examine both breasts, feeling for lumps and thickening. Begin at the top of your breast, and go clockwise around the whole breast.   At the end of your exam, gently squeeze each nipple to see if there is any drainage of fluids. Look for nipple changes, dimpling, or redness.   Lastly, examine the upper chest and collarbone (clavicle) areas, and in your armpits.  It is not necessary to be alarmed if you find a breast lump. Most of them are not cancerous. However, it is necessary to see your caregiver if a lump is found, in order to have it looked at. Document Released: 09/30/2005 Document Re-Released: 10/22/2009 [employer] Patient Information 2011 [employer]. 

## 2011-07-29 NOTE — Progress Notes (Signed)
Subjective:     Patient ID: [name], female   DOB: [birth year], 60 y.o.   MRN: [id number]  HPI This is a 60 year old female who had a radial scar a core biopsy after an abnormal left-sided mammogram. I then to go to the operating room and a wire-guided excisional biopsy of this area that the final pathology showed a radial sclerosing lesion. She reports no real complaints postoperatively he comes back in today for final check.  Review of Systems     Objective:   Physical Exam Well healing incision left breast with small eschar present, no infection    Assessment:     S/p left breast biopsy with radial scar    Plan:     She can return to all for full activity at this point and come back and see me as needed.  I discussed with her continuing her routine breast cancer surveillance including monthly self exams, yearly clinical exams, annual mammograms.  I also gave her copy of her pathology today.

## 2011-10-23 ENCOUNTER — Encounter (HOSPITAL_BASED_OUTPATIENT_CLINIC_OR_DEPARTMENT_OTHER): Payer: Self-pay

## 2011-10-23 ENCOUNTER — Emergency Department (INDEPENDENT_AMBULATORY_CARE_PROVIDER_SITE_OTHER): Payer: BC Managed Care – PPO

## 2011-10-23 ENCOUNTER — Other Ambulatory Visit (HOSPITAL_BASED_OUTPATIENT_CLINIC_OR_DEPARTMENT_OTHER): Payer: Self-pay

## 2011-10-23 ENCOUNTER — Emergency Department (HOSPITAL_BASED_OUTPATIENT_CLINIC_OR_DEPARTMENT_OTHER)
Admission: EM | Admit: 2011-10-23 | Discharge: 2011-10-23 | Disposition: A | Discharge status: Home / Self Care | Payer: BC Managed Care – PPO | Attending: Emergency Medicine | Admitting: Emergency Medicine

## 2011-10-23 DIAGNOSIS — W230XXA Caught, crushed, jammed, or pinched between moving objects, initial encounter: Secondary | ICD-10-CM

## 2011-10-23 DIAGNOSIS — T148XXA Other injury of unspecified body region, initial encounter: Secondary | ICD-10-CM

## 2011-10-23 DIAGNOSIS — S60229A Contusion of unspecified hand, initial encounter: Secondary | ICD-10-CM | POA: Insufficient documentation

## 2011-10-23 DIAGNOSIS — Z8739 Personal history of other diseases of the musculoskeletal system and connective tissue: Secondary | ICD-10-CM | POA: Insufficient documentation

## 2011-10-23 DIAGNOSIS — M79609 Pain in unspecified limb: Secondary | ICD-10-CM

## 2011-10-23 DIAGNOSIS — Z79899 Other long term (current) drug therapy: Secondary | ICD-10-CM | POA: Insufficient documentation

## 2011-10-23 DIAGNOSIS — Y9289 Other specified places as the place of occurrence of the external cause: Secondary | ICD-10-CM | POA: Insufficient documentation

## 2011-10-23 DIAGNOSIS — I10 Essential (primary) hypertension: Secondary | ICD-10-CM | POA: Insufficient documentation

## 2011-10-23 DIAGNOSIS — S6720XA Crushing injury of unspecified hand, initial encounter: Secondary | ICD-10-CM

## 2011-10-23 MED ORDER — IBUPROFEN 600 MG PO TABS: 600.0000 mg | ORAL_TABLET | Freq: Four times a day (QID) | ORAL | Status: DC | PRN

## 2011-10-23 MED ORDER — IBUPROFEN 400 MG PO TABS
600.0000 mg | ORAL_TABLET | Freq: Once | ORAL | Status: AC
  Administered 2011-10-23: 600 mg via ORAL
  Filled 2011-10-23: qty 1

## 2011-10-23 MED ORDER — HYDROCODONE-ACETAMINOPHEN 5-325 MG PO TABS: 2.0000 | ORAL_TABLET | ORAL | Status: AC | PRN

## 2011-10-23 NOTE — ED Notes (Signed)
Injury to right hand that occurred this am while at work.  States hand was caught between shopping carts.

## 2011-10-23 NOTE — ED Provider Notes (Signed)
History     CSN: [id number]  Arrival date & time 10/23/11  1600   First MD Initiated Contact with Patient 10/23/11 1636      Chief Complaint  Patient presents with  . Hand Injury    (Consider location/radiation/quality/duration/timing/severity/associated sxs/prior treatment) HPI  presents with right hand pain 4/10 and worse with movement. The patient states that earlier today at work she crushed her right hand in between 2 shopping carts. She complains of pain to the medial aspect of her right hand and numbness which has been intermittent to her pinky finger. She is not complaining of numbness at this time. There is no tingling or weakness. She denies any other injury. She does not take anticoagulants. She is driving today   ED Notes, ED Provider Notes from 10/23/11 0000 to 10/23/11 16:33:48       Joylene John, RN 10/23/2011 16:32      Injury to right hand that occurred this am while at work. States hand was caught between shopping carts.     Past Medical History  Diagnosis Date  . Arthritis   . Hypertension   . Family history of breast cancer     aunt - fathers side  . Family history of colon cancer     mother, brother, aunt - mother's side  . Family history of lung cancer     father    Past Surgical History  Procedure Date  . Rotator cuff surgery 2011, 2012    right in 2011, left in 2012  . Joint replacement 09/16/2007    left  . Knee arthroscopy 03/2010    right  . Ganglion cyst excision   . Breast surgery 07/10/11    left breast excisional biopsy    Family History  Problem Relation Age of Onset  . Cancer Mother     colon  . Cancer Father     lung    History  Substance Use Topics  . Smoking status: Former Smoker    Quit date: 06/25/1980  . Smokeless tobacco: Never Used  . Alcohol Use: Yes     one glass a day    OB History    Grav Para Term Preterm Abortions TAB SAB Ect Mult Living                  Review of Systems  All other systems reviewed  and are negative.   except as noted HPI   Allergies  Review of patient's allergies indicates no known allergies.  Home Medications   Current Outpatient Rx  Name Route Sig Dispense Refill  . ALPRAZOLAM 0.5 MG PO TABS Oral Take 0.25 mg by mouth daily as needed. For anxiety    . AMLODIPINE BESYLATE-VALSARTAN 10-320 MG PO TABS Oral Take 1 tablet by mouth daily.      . BUPROPION HCL ER (XL) 300 MG PO TB24 Oral Take 300 mg by mouth daily.      . CELECOXIB 200 MG PO CAPS Oral Take 200 mg by mouth daily.      Marland Kitchen VITAMIN D3 2000 UNITS PO TABS Oral Take 2,000 Units by mouth daily.     Marland Kitchen ESTROGENS CONJUGATED 0.45 MG PO TABS Oral Take 0.45 mg by mouth daily. Take daily for 21 days then do not take for 7 days.     Marland Kitchen HYDROCODONE-ACETAMINOPHEN 5-325 MG PO TABS Oral Take 1 tablet by mouth once.    Marland Kitchen ZOLPIDEM TARTRATE ER 12.5 MG PO TBCR Oral Take 12.5  mg by mouth at bedtime.     Marland Kitchen HYDROCODONE-ACETAMINOPHEN 5-325 MG PO TABS Oral Take 2 tablets by mouth every 4 (four) hours as needed for pain. 10 tablet 0  . IBUPROFEN 600 MG PO TABS Oral Take 1 tablet (600 mg total) by mouth every 6 (six) hours as needed for pain. 30 tablet 0    BP 126/82  Pulse 80  Temp(Src) 98.9 F (37.2 C) (Oral)  Resp 16  Ht 5\' 3"  (1.6 m)  Wt 147 lb (66.679 kg)  BMI 26.04 kg/m2  SpO2 99%  Physical Exam  Nursing note and vitals reviewed. Constitutional: She is oriented to person, place, and time. She appears well-developed.  HENT:  Head: Atraumatic.  Mouth/Throat: Oropharynx is clear and moist.  Eyes: Conjunctivae and EOM are normal. Pupils are equal, round, and reactive to light.  Neck: Normal range of motion. Neck supple.  Cardiovascular: Normal rate, regular rhythm, normal heart sounds and intact distal pulses.   Pulmonary/Chest: Effort normal and breath sounds normal. No respiratory distress. She has no wheezes. She has no rales.  Abdominal: Soft. She exhibits no distension. There is no tenderness. There is no rebound  and no guarding.  Musculoskeletal: Normal range of motion. She exhibits tenderness.       Arms:      Gross sensation intact throughout Grip strength 5/5  Cap refill < 3 sec  Neurological: She is alert and oriented to person, place, and time.  Skin: Skin is warm and dry. No rash noted.  Psychiatric: She has a normal mood and affect.    ED Course  Procedures (including critical care time)  Labs Reviewed - No data to display Dg Hand Complete Right  10/23/2011  *RADIOLOGY REPORT*  Clinical Data: Crush injury.  RIGHT HAND - COMPLETE 3+ VIEW  Comparison: None.  Findings: No acute bony or joint abnormality is identified.  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  IMPRESSION: No acute finding.  Osteoarthritis as above.  Original Report Authenticated By: Bernadene Bell. D'ALESSIO, M.D.     1. Hand contusion   2. Crush injury     MDM  Hand contusion. Neurovascularly intact. Immobilize by pt request as pain worse with movement. Ibuprofen here. Ibuprofen for home, norco for breakthrough pain. PMD f/u as needed.         Forbes Cellar, MD 10/23/11 1727

## 2011-10-23 NOTE — ED Notes (Signed)
Ace wrap placed on patient's right hand.  PMS present post application.  Patient advised to wear the wrap as needed for added support.

## 2011-10-25 ENCOUNTER — Encounter (HOSPITAL_COMMUNITY): Payer: Self-pay | Admitting: Emergency Medicine

## 2011-10-25 ENCOUNTER — Emergency Department (HOSPITAL_COMMUNITY)
Admission: EM | Admit: 2011-10-25 | Discharge: 2011-10-25 | Disposition: A | Discharge status: Home / Self Care | Payer: BC Managed Care – PPO | Source: Home / Self Care | Attending: Emergency Medicine | Admitting: Emergency Medicine

## 2011-10-25 DIAGNOSIS — M549 Dorsalgia, unspecified: Secondary | ICD-10-CM

## 2011-10-25 MED ORDER — MELOXICAM 15 MG PO TABS: 15.0000 mg | ORAL_TABLET | Freq: Every day | ORAL | Status: DC

## 2011-10-25 MED ORDER — CYCLOBENZAPRINE HCL 5 MG PO TABS: 5.0000 mg | ORAL_TABLET | Freq: Three times a day (TID) | ORAL | Status: AC | PRN

## 2011-10-25 NOTE — ED Notes (Signed)
PT HERE WITH LEFT HIP PAIN RADIATING ACROSS RIGHT SHOULDER S/P FALL TODAY WHILE AT WORK.PT STATES I WAS WALKING WHEN FLOOR WAS WET SLIPPED AND FELL FACE FORWARD ON RIGHT ARM  AND TWISTING HIP.C/O STIFFNESS.NO BRUISES SEENPT STATES THE PAIN WORSENS WITH AMBULATION

## 2011-10-25 NOTE — ED Provider Notes (Signed)
Ms. Mourer is a 61 year old female who presents to clinic today with hip and back pain following a fall at work at 1 pm.  She slipped on a melted [employer] and landed on her anterior right knee.  She continued working for 4 more hours and presents to urgent care tonight as her pain is worsening.  She is able to walk but is experiencing some back pain and stiffness.   PMH reviewed.  ROS as above otherwise neg Medications reviewed. No current facility-administered medications for this encounter.   Current Outpatient Prescriptions  Medication Sig Dispense Refill  . amLODipine-valsartan (EXFORGE) 10-320 MG per tablet Take 1 tablet by mouth daily.        Marland Kitchen buPROPion (WELLBUTRIN XL) 300 MG 24 hr tablet Take 300 mg by mouth daily.        Marland Kitchen estrogens, conjugated, (PREMARIN) 0.45 MG tablet Take 0.45 mg by mouth daily. Take daily for 21 days then do not take for 7 days.       Marland Kitchen zolpidem (AMBIEN CR) 12.5 MG CR tablet Take 12.5 mg by mouth at bedtime.       . ALPRAZolam (XANAX) 0.5 MG tablet Take 0.25 mg by mouth daily as needed. For anxiety      . celecoxib (CELEBREX) 200 MG capsule Take 200 mg by mouth daily.        . Cholecalciferol (VITAMIN D3) 2000 UNITS TABS Take 2,000 Units by mouth daily.       Marland Kitchen HYDROcodone-acetaminophen (NORCO) 5-325 MG per tablet Take 1 tablet by mouth once.      Marland Kitchen HYDROcodone-acetaminophen (NORCO) 5-325 MG per tablet Take 2 tablets by mouth every 4 (four) hours as needed for pain.  10 tablet  0  . ibuprofen (ADVIL,MOTRIN) 600 MG tablet Take 1 tablet (600 mg total) by mouth every 6 (six) hours as needed for pain.  30 tablet  0    Exam:  BP 146/73  Pulse 90  Temp(Src) 98.5 F (36.9 C) (Oral)  Resp 18  SpO2 100% Gen: Well NAD Lungs: CTABL Nl WOB Heart: RRR no MRG MSK: Nontender spinal midline. Left SI joint tenderness to palpation. Normal hip range of motion. Normal knees range of motion. Right knee has a bruise on the anterior aspect but has normal range of motion. Gait  is normal strength is normal patient is able to get onto an operative exam table by herself.  Assessment and plan:  Musculoskeletal pain following a fall. Plan for meloxicam and Flexeril and follow up with PCP in 2 weeks if pain not resolved. No red flags present.   Clementeen Graham 10/25/11 1802

## 2011-10-25 NOTE — ED Provider Notes (Signed)
Medical screening examination/treatment/procedure(s) were performed by a resident physician and as supervising physician I was immediately available for consultation/collaboration.  Hillery Hunter, MD 10/25/11 2056

## 2011-11-18 ENCOUNTER — Ambulatory Visit
Admission: RE | Admit: 2011-11-18 | Discharge: 2011-11-18 | Disposition: A | Discharge status: Home / Self Care | Payer: BC Managed Care – PPO | Source: Ambulatory Visit | Attending: Family Medicine | Admitting: Family Medicine

## 2011-11-18 ENCOUNTER — Other Ambulatory Visit: Payer: Self-pay | Admitting: Family Medicine

## 2011-11-18 DIAGNOSIS — M1990 Unspecified osteoarthritis, unspecified site: Secondary | ICD-10-CM

## 2011-11-22 ENCOUNTER — Other Ambulatory Visit: Payer: Self-pay | Admitting: Family Medicine

## 2011-11-22 DIAGNOSIS — M1990 Unspecified osteoarthritis, unspecified site: Secondary | ICD-10-CM

## 2011-11-25 ENCOUNTER — Ambulatory Visit
Admission: RE | Admit: 2011-11-25 | Discharge: 2011-11-25 | Disposition: A | Discharge status: Home / Self Care | Payer: No Typology Code available for payment source | Source: Ambulatory Visit | Attending: Family Medicine | Admitting: Family Medicine

## 2011-11-25 DIAGNOSIS — M1990 Unspecified osteoarthritis, unspecified site: Secondary | ICD-10-CM

## 2011-12-10 ENCOUNTER — Other Ambulatory Visit: Payer: Self-pay | Admitting: Urology

## 2011-12-11 MED ORDER — MITOMYCIN CHEMO FOR BLADDER INSTILLATION 40 MG: 40.0000 mg | Freq: Once | INTRAVENOUS | Status: DC

## 2011-12-23 ENCOUNTER — Encounter (HOSPITAL_BASED_OUTPATIENT_CLINIC_OR_DEPARTMENT_OTHER): Payer: Self-pay | Admitting: *Deleted

## 2011-12-23 NOTE — Progress Notes (Signed)
NPO AFTER MN. ARRIVES AT 1015. NEEDS ISTAT . CURRENT EKG IN CHART.

## 2011-12-26 NOTE — H&P (Signed)
History of Present Illness         [name] is a 61 year old female patient of Dr. Renne Crigler who was seen recently for further evaluation of a bladder mass noted on CT scan done on 11/25/11. She underwent both the CT scan and ultrasound in preparation for clinical study for a new medication for osteoporosis that has the risk of stone formation. She reports that for some time now she has been having frequency, urgency and occasional episodes of urge incontinence. Also she will have some mild, intermittent dysuria. She's not seen any hematuria. She also denies any severe flank pain although she said she felt like she might have a full sensation in the left flank region occasionally. Her bladder mass would be considered severe with no modifying factors.   Past Medical History Problems  1. History of  Anxiety (Symptom) 300.00 2. History of  Arthritis V13.4 3. History of  Hypertension 401.9 4. History of  Sleep Apnea 780.57  Surgical History Problems  1. History of  Arthroscopy Knee Right 2. History of  Hysteroscopy Of Uterus 3. History of  Incisional Breast Biopsy Left 4. History of  Knee Surgery Left 5. History of  Rotator Cuff Repair Right  Current Meds 1. ALPRAZolam 0.5 MG Oral Tablet; Therapy: 04Sep2012 to 2. BuPROPion HCl ER (XL) 300 MG Oral Tablet Extended Release 24 Hour; Therapy: 08Oct2012 to 3. CeleBREX 200 MG Oral Capsule; Therapy: 15Sep2012 to 4. Exforge HCT 10-320-25 MG Oral Tablet; Therapy: 17Dec2012 to 5. Hydrocodone-Acetaminophen 5-325 MG Oral Tablet; Therapy: 13Sep2012 to 6. Premarin 0.45 MG Oral Tablet; Therapy: 19Sep2012 to 7. Voltaren 1 % Transdermal Gel; Therapy: 21Nov2012 to 8. Zolpidem Tartrate ER 12.5 MG Oral Tablet Extended Release; Therapy: 18Sep2012 to  Allergies Medication  1. No Known Drug Allergies  Family History Problems  1. Family history of  Colon Cancer V16.0 2. Family history of  Death In The Family Father 3. Family history of  Family Health Status -  Mother's Age 62. Family history of  Family Health Status Number Of Children 5. Family history of  Lung Cancer V16.1 6. Family history of  Rectal Cancer  Social History Problems    Alcohol Use   Caffeine Use   Former Smoker V15.82   Marital History - Currently Married  Review of Systems Genitourinary, constitutional, skin, eye, otolaryngeal, hematologic/lymphatic, cardiovascular, pulmonary, endocrine, musculoskeletal, gastrointestinal, neurological and psychiatric system(s) were reviewed and pertinent findings if present are noted.  Genitourinary: urinary frequency, urinary urgency, dysuria and incontinence.  Gastrointestinal: heartburn.  Musculoskeletal: back pain and joint pain.  Neurological: headache.  Psychiatric: anxiety.    Vitals Vital Signs  BMI Calculated: 24.41 BSA Calculated: 1.7 Height: 5 ft 4 in Weight: 143 lb  Blood Pressure: 138 / 77 Heart Rate: 95  Physical Exam Constitutional: Well nourished and well developed . No acute distress.  ENT:. The ears and nose are normal in appearance.  Neck: The appearance of the neck is normal and no neck mass is present.  Pulmonary: No respiratory distress and normal respiratory rhythm and effort.  Cardiovascular: Heart rate and rhythm are normal . No peripheral edema.  Abdomen: The abdomen is soft and nontender. No masses are palpated. No CVA tenderness. No hernias are palpable. No hepatosplenomegaly noted.  Genitourinary: Examination of the external genitalia shows normal female external genitalia and no lesions. The urethra is normal in appearance and not tender. There is no urethral mass. Vaginal exam demonstrates no abnormalities. The adnexa are palpably normal. The bladder is non  tender and not distended. The anus is normal on inspection. The perineum is normal on inspection.  Lymphatics: The femoral and inguinal nodes are not enlarged or tender.  Skin: Normal skin turgor, no visible rash and no visible skin lesions.    Neuro/Psych:. Mood and affect are appropriate.    Results/Data Urine  COLOR: YELLOW  Reference Range YELLOW APPEARANCE: CLEAR  Reference Range CLEAR SPECIFIC GRAVITY: 1.025  Reference Range 1.005-1.030 pH: 5.5  Reference Range 5.0-8.0 GLUCOSE: NEG mg/dL Reference Range NEG BILIRUBIN: NEG  Reference Range NEG KETONE: NEG mg/dL Reference Range NEG BLOOD: TRACE  Abnormal Reference Range NEG PROTEIN: NEG mg/dL Reference Range NEG UROBILINOGEN: 0.2 mg/dL Reference Range 1.6-1.0 NITRITE: NEG  Reference Range NEG LEUKOCYTE ESTERASE: NEG  Reference Range NEG SQUAMOUS EPITHELIAL/HPF: FEW  Reference Range RARE WBC: 0-3 WBC/hpf Reference Range <4 RBC: 0-3 RBC/hpf Reference Range <4 BACTERIA: FEW  Abnormal Reference Range RARE CRYSTALS: NONE SEEN  Reference Range NONE SEEN CASTS: NONE SEEN  Reference Range NONE SEEN  The following images/tracing/specimen were independently visualized:  CT scan as above.    Procedure  Procedure: Cystoscopy  Indication: Bladder Mass.  Informed Consent: Risks, benefits, and potential adverse events were discussed and informed consent was obtained from the patient.  Prep: The patient was prepped with hibiclens.  Procedure Note:  Urethral meatus:. No abnormalities.  Anterior urethra: No abnormalities.  Bladder: Visulization was clear. The ureteral orifices were in the normal anatomic position bilaterally and had clear efflux of urine. A solitary tumor was visualized in the bladder. A papillary tumor was seen in the bladder. This tumor was located on the right side of the bladder. The patient tolerated the procedure well.  Complications: None.    Assessment Assessed  1. 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 We discussed the fact that currently she has no  evidence of extravesical extension or pelvic adenopathy based on her CT scan findings. Further characterization of the lesion is required for grading and staging purposes. We discussed proceeding with evaluation using transurethral resection of the lesion. I have discussed the procedure in detail as well as the potential risks and complications associated with this form of surgery. We also discussed the probability of successful resection of the intravesical portion of this lesion. I have recommended, as long as there is no contraindication at the time of surgery, the placement of intravesical mitomycin-C in order to reduce the risk of recurrence. We did discuss the potential side effects of this form of intravesical chemotherapy. The procedure will be performed under anesthesia as an outpatient.   Plan Health Maintenance (V70.0)  1. UA With REFLEX  Done: 25Feb2013 02:51PM Transitional Cell Carcinoma Of The Bladder (188.9)  2. Cysto  Done: 25Feb2013 3. Follow-up Schedule Surgery Office  Follow-up  Done: 25Feb2013       She will be scheduled for a transurethral resection of her bladder tumor. I will tentatively plan to use postoperative mitomycin C.

## 2011-12-26 NOTE — Discharge Instructions (Signed)

## 2011-12-27 ENCOUNTER — Encounter (HOSPITAL_BASED_OUTPATIENT_CLINIC_OR_DEPARTMENT_OTHER): Payer: Self-pay | Admitting: Anesthesiology

## 2011-12-27 ENCOUNTER — Encounter (HOSPITAL_BASED_OUTPATIENT_CLINIC_OR_DEPARTMENT_OTHER)
Admission: RE | Disposition: A | Discharge status: Home / Self Care | Payer: Self-pay | Source: Ambulatory Visit | Attending: Urology

## 2011-12-27 ENCOUNTER — Encounter (HOSPITAL_BASED_OUTPATIENT_CLINIC_OR_DEPARTMENT_OTHER): Payer: Self-pay | Admitting: *Deleted

## 2011-12-27 ENCOUNTER — Ambulatory Visit (HOSPITAL_BASED_OUTPATIENT_CLINIC_OR_DEPARTMENT_OTHER)
Admission: RE | Admit: 2011-12-27 | Discharge: 2011-12-27 | Disposition: A | Discharge status: Home / Self Care | Payer: BC Managed Care – PPO | Source: Ambulatory Visit | Attending: Urology | Admitting: Urology

## 2011-12-27 ENCOUNTER — Ambulatory Visit (HOSPITAL_BASED_OUTPATIENT_CLINIC_OR_DEPARTMENT_OTHER): Payer: BC Managed Care – PPO | Admitting: Anesthesiology

## 2011-12-27 DIAGNOSIS — D494 Neoplasm of unspecified behavior of bladder: Secondary | ICD-10-CM

## 2011-12-27 DIAGNOSIS — Z87891 Personal history of nicotine dependence: Secondary | ICD-10-CM | POA: Insufficient documentation

## 2011-12-27 DIAGNOSIS — C679 Malignant neoplasm of bladder, unspecified: Secondary | ICD-10-CM | POA: Insufficient documentation

## 2011-12-27 DIAGNOSIS — Z79899 Other long term (current) drug therapy: Secondary | ICD-10-CM | POA: Insufficient documentation

## 2011-12-27 DIAGNOSIS — M810 Age-related osteoporosis without current pathological fracture: Secondary | ICD-10-CM | POA: Insufficient documentation

## 2011-12-27 DIAGNOSIS — I10 Essential (primary) hypertension: Secondary | ICD-10-CM | POA: Insufficient documentation

## 2011-12-27 DIAGNOSIS — Z801 Family history of malignant neoplasm of trachea, bronchus and lung: Secondary | ICD-10-CM | POA: Insufficient documentation

## 2011-12-27 DIAGNOSIS — G4730 Sleep apnea, unspecified: Secondary | ICD-10-CM | POA: Insufficient documentation

## 2011-12-27 DIAGNOSIS — Z800 Family history of malignant neoplasm of digestive organs: Secondary | ICD-10-CM | POA: Insufficient documentation

## 2011-12-27 HISTORY — DX: Neoplasm of unspecified behavior of bladder: D49.4

## 2011-12-27 HISTORY — DX: Reserved for inherently not codable concepts without codable children: IMO0001

## 2011-12-27 HISTORY — DX: Obstructive sleep apnea (adult) (pediatric): Z99.89

## 2011-12-27 HISTORY — DX: Other intervertebral disc degeneration, lumbar region without mention of lumbar back pain or lower extremity pain: M51.369

## 2011-12-27 HISTORY — PX: TRANSURETHRAL RESECTION OF BLADDER TUMOR: SHX2575

## 2011-12-27 HISTORY — DX: Encounter for other specified aftercare: Z51.89

## 2011-12-27 HISTORY — DX: Other intervertebral disc degeneration, lumbar region: M51.36

## 2011-12-27 HISTORY — DX: Obstructive sleep apnea (adult) (pediatric): G47.33

## 2011-12-27 HISTORY — DX: Urgency of urination: R39.15

## 2011-12-27 LAB — POCT I-STAT, CHEM 8
BUN: 20 mg/dL (ref 6–23)
Calcium, Ion: 1.3 mmol/L (ref 1.12–1.32)
Chloride: 106 meq/L (ref 96–112)
Creatinine, Ser: 0.6 mg/dL (ref 0.50–1.10)
Glucose, Bld: 100 mg/dL — ABNORMAL HIGH (ref 70–99)
HCT: 37 % (ref 36.0–46.0)
Hemoglobin: 12.6 g/dL (ref 12.0–15.0)
Potassium: 4 meq/L (ref 3.5–5.1)
Sodium: 144 meq/L (ref 135–145)
TCO2: 27 mmol/L (ref 0–100)

## 2011-12-27 SURGERY — TURBT (TRANSURETHRAL RESECTION OF BLADDER TUMOR)
Anesthesia: General | Site: Bladder | Wound class: Clean Contaminated

## 2011-12-27 MED ORDER — SODIUM CHLORIDE 0.9 % IR SOLN
Status: DC | PRN
  Administered 2011-12-27: 6000 mL

## 2011-12-27 MED ORDER — PHENAZOPYRIDINE HCL 200 MG PO TABS: 200.0000 mg | ORAL_TABLET | Freq: Three times a day (TID) | ORAL | Status: AC | PRN

## 2011-12-27 MED ORDER — CIPROFLOXACIN IN D5W 200 MG/100ML IV SOLN
200.0000 mg | INTRAVENOUS | Status: AC
  Administered 2011-12-27: 200 mg via INTRAVENOUS

## 2011-12-27 MED ORDER — ONDANSETRON HCL 4 MG/2ML IJ SOLN
INTRAMUSCULAR | Status: DC | PRN
  Administered 2011-12-27: 4 mg via INTRAVENOUS

## 2011-12-27 MED ORDER — HYDROCODONE-ACETAMINOPHEN 7.5-325 MG PO TABS
1.0000 | ORAL_TABLET | Freq: Four times a day (QID) | ORAL | Status: DC | PRN
  Administered 2011-12-27: 1 via ORAL

## 2011-12-27 MED ORDER — MIDAZOLAM HCL 5 MG/5ML IJ SOLN
INTRAMUSCULAR | Status: DC | PRN
  Administered 2011-12-27: 2 mg via INTRAVENOUS

## 2011-12-27 MED ORDER — PHENAZOPYRIDINE HCL 200 MG PO TABS: 200.0000 mg | ORAL_TABLET | Freq: Three times a day (TID) | ORAL | Status: DC

## 2011-12-27 MED ORDER — LACTATED RINGERS IV SOLN
INTRAVENOUS | Status: DC
  Administered 2011-12-27 (×3): via INTRAVENOUS

## 2011-12-27 MED ORDER — DEXAMETHASONE SODIUM PHOSPHATE 4 MG/ML IJ SOLN
INTRAMUSCULAR | Status: DC | PRN
  Administered 2011-12-27: 10 mg via INTRAVENOUS

## 2011-12-27 MED ORDER — FENTANYL CITRATE 0.05 MG/ML IJ SOLN
INTRAMUSCULAR | Status: DC | PRN
  Administered 2011-12-27 (×2): 25 ug via INTRAVENOUS
  Administered 2011-12-27: 50 ug via INTRAVENOUS

## 2011-12-27 MED ORDER — PHENAZOPYRIDINE HCL 200 MG PO TABS
200.0000 mg | ORAL_TABLET | Freq: Three times a day (TID) | ORAL | Status: DC
  Administered 2011-12-27: 200 mg via ORAL

## 2011-12-27 MED ORDER — FENTANYL CITRATE 0.05 MG/ML IJ SOLN: 25.0000 ug | INTRAMUSCULAR | Status: DC | PRN

## 2011-12-27 MED ORDER — HYDROCODONE-ACETAMINOPHEN 7.5-325 MG PO TABS: 1.0000 | ORAL_TABLET | ORAL | Status: AC | PRN

## 2011-12-27 MED ORDER — LIDOCAINE HCL (CARDIAC) 20 MG/ML IV SOLN
INTRAVENOUS | Status: DC | PRN
  Administered 2011-12-27: 70 mg via INTRAVENOUS
  Administered 2011-12-27: 30 mg via INTRAVENOUS

## 2011-12-27 MED ORDER — PROPOFOL 10 MG/ML IV EMUL
INTRAVENOUS | Status: DC | PRN
  Administered 2011-12-27: 50 mg via INTRAVENOUS
  Administered 2011-12-27: 200 mg via INTRAVENOUS

## 2011-12-27 SURGICAL SUPPLY — 36 items
BAG DRAIN URO-CYSTO SKYTR STRL (DRAIN) ×2 IMPLANT
BAG URINE DRAINAGE (UROLOGICAL SUPPLIES) ×2 IMPLANT
BAG URINE LEG 19OZ MD ST LTX (BAG) IMPLANT
CANISTER SUCT LVC 12 LTR MEDI- (MISCELLANEOUS) ×4 IMPLANT
CATH FOLEY 2WAY SLVR  5CC 20FR (CATHETERS) ×1
CATH FOLEY 2WAY SLVR  5CC 22FR (CATHETERS)
CATH FOLEY 2WAY SLVR  5CC 24FR (CATHETERS) ×1
CATH FOLEY 2WAY SLVR 5CC 20FR (CATHETERS) ×1 IMPLANT
CATH FOLEY 2WAY SLVR 5CC 22FR (CATHETERS) IMPLANT
CATH FOLEY 2WAY SLVR 5CC 24FR (CATHETERS) ×1 IMPLANT
CATH URET 5FR 28IN OPEN ENDED (CATHETERS) ×2 IMPLANT
CLOTH BEACON ORANGE TIMEOUT ST (SAFETY) ×2 IMPLANT
DRAPE CAMERA CLOSED 9X96 (DRAPES) ×2 IMPLANT
ELECT BUTTON BIOP 24F 90D PLAS (MISCELLANEOUS) ×2 IMPLANT
ELECT LOOP HF 26F 30D .35MM (CUTTING LOOP) IMPLANT
ELECT LOOP MED HF 24F 12D CBL (CLIP) ×2 IMPLANT
ELECT REM PT RETURN 9FT ADLT (ELECTROSURGICAL) ×2
ELECT RESECT VAPORIZE 12D CBL (ELECTRODE) IMPLANT
ELECTRODE REM PT RTRN 9FT ADLT (ELECTROSURGICAL) ×1 IMPLANT
EVACUATOR MICROVAS BLADDER (UROLOGICAL SUPPLIES) ×2 IMPLANT
GLOVE BIO SURGEON STRL SZ 6.5 (GLOVE) ×2 IMPLANT
GLOVE BIO SURGEON STRL SZ8 (GLOVE) ×2 IMPLANT
GLOVE BIOGEL M STRL SZ7.5 (GLOVE) ×2 IMPLANT
GLOVE ECLIPSE 6.0 STRL STRAW (GLOVE) ×2 IMPLANT
GLOVE INDICATOR 7.5 STRL GRN (GLOVE) ×2 IMPLANT
GOWN PREVENTION PLUS LG XLONG (DISPOSABLE) ×4 IMPLANT
GOWN STRL REIN XL XLG (GOWN DISPOSABLE) ×2 IMPLANT
GUIDEWIRE STR DUAL SENSOR (WIRE) ×2 IMPLANT
HOLDER FOLEY CATH W/STRAP (MISCELLANEOUS) ×2 IMPLANT
IV NS IRRIG 3000ML ARTHROMATIC (IV SOLUTION) ×4 IMPLANT
KIT ASPIRATION TUBING (SET/KITS/TRAYS/PACK) ×2 IMPLANT
LOOP CUTTING 24FR OLYMPUS (CUTTING LOOP) IMPLANT
PACK CYSTOSCOPY (CUSTOM PROCEDURE TRAY) ×2 IMPLANT
PLUG CATH AND CAP STER (CATHETERS) IMPLANT
SET ASPIRATION TUBING (TUBING) IMPLANT
WATER STERILE IRR 3000ML UROMA (IV SOLUTION) IMPLANT

## 2011-12-27 NOTE — Transfer of Care (Signed)
Immediate Anesthesia Transfer of Care Note  Patient: [name]  Procedure(s) Performed: Procedure(s) (LRB): TRANSURETHRAL RESECTION OF BLADDER TUMOR (TURBT) (N/A)  Patient Location: PACU  Anesthesia Type: General  Level of Consciousness: awake, alert  and oriented  Airway & Oxygen Therapy: Patient Spontanous Breathing and Patient connected to face mask oxygen  Post-op Assessment: Report given to PACU RN and Post -op Vital signs reviewed and stable  Post vital signs: Reviewed and stable  Complications: No apparent anesthesia complications

## 2011-12-27 NOTE — Anesthesia Postprocedure Evaluation (Signed)
Anesthesia Post Note  Patient: [name]  Procedure(s) Performed: Procedure(s) (LRB): TRANSURETHRAL RESECTION OF BLADDER TUMOR (TURBT) (N/A)  Anesthesia type: General  Patient location: PACU  Post pain: Pain level controlled  Post assessment: Post-op Vital signs reviewed  Last Vitals:  Filed Vitals:   12/27/11 1200  BP: 118/65  Pulse: 77  Temp:   Resp: 22    Post vital signs: Reviewed  Level of consciousness: sedated  Complications: No apparent anesthesia complications

## 2011-12-27 NOTE — Progress Notes (Signed)
Pt voided 15 cc's  Thick bloody urine w/ 2 large gelatinous clots.  Admitted to some burning,  Denies pain.  Dr. Vernie Ammons paged and informed.  Will encourage fluids and monitor.

## 2011-12-27 NOTE — Interval H&P Note (Signed)
History and Physical Interval Note:  12/27/2011 10:30 AM  [name]  has presented today for surgery, with the diagnosis of bladder tumor  The various methods of treatment have been discussed with the patient and family. After consideration of risks, benefits and other options for treatment, the patient has consented to  Procedure(s) (LRB): TRANSURETHRAL RESECTION OF BLADDER TUMOR (TURBT) (N/A) as a surgical intervention .  The patients' history has been reviewed, patient examined, no change in status, stable for surgery.  I have reviewed the patients' chart and labs.  Questions were answered to the patient's satisfaction.     Garnett Farm

## 2011-12-27 NOTE — Op Note (Signed)
PATIENT:  [name]  PRE-OPERATIVE DIAGNOSIS: Bladder tumor  POST-OPERATIVE DIAGNOSIS: Same  PROCEDURE:  Procedure(s): 1. TRANSURETHRAL RESECTION OF BLADDER TUMOR (TURBT) (4 cm.) 2. Right ureteral catheterization.  SURGEON:  Surgeon(s): Garnett Farm  ANESTHESIA:   General  EBL:  Minimal  SPECIMEN:  Source of Specimen:  1. Bladder tumor 2. Base of bladder tumor  DISPOSITION OF SPECIMEN:  PATHOLOGY  Indication: [name] is a 61 year old female who underwent a CT scan in preparation for a clinical study for a new medication for osteoporosis. She was found to have a soft tissue mass within the bladder on the right-hand side near the ureterovesical junction as well as some fullness to the upper half of the ureter and collecting system of the right ureter. I was not able to appreciate any ureteral duplication on her CT scan. Cystoscopically I found a papillary lesion that appeared to have a narrow stalk and was located just cephalad and lateral to the ureteral orifice. We discussed surgical excision of the lesion and she is brought to the operating room today to undergo that procedure.   Description of operation: The patient was taken to the operating room and administered general anesthesia. He was then placed on the table and moved to the dorsal lithotomy position after which his genitalia was sterilely prepped and draped. An official timeout was then performed.  Cystoscopy was initially performed using the 22 French cystoscope with both 12 and 70 lenses. The bladder was fully and systematically inspected and I noted the single tumor on the floor of the bladder cephalad and lateral to the right ureteral orifice but was noted not to involve the ureteral orifice. It had a stalk that was narrow based. No other lesions were seen within the bladder. The left ureteral orifice was of normal configuration and position. The right orifice seemed slightly off of the trigonal ridge inferiorly but  was slitlike and appeared normal otherwise.  The 26 French resectoscope with [place] obturator was then introduced into the bladder and the obturator was removed. The resectoscope element with 12 lens was then inserted. I was able to place the loop directly behind the "stalk" of the tumor and resected it flush with the floor of the bladder. This resulted in a large tumor floating in the bladder and I tried to remove it with the Microvasive evacuator but was unsuccessful. I then inserted the alligator grasping forceps and was able to grasp the tumor and removed a portion of it however it required several passes and attempts at Foley extracting this rather large tumor which had been resected nearly intact. All of these portions of the tumor were then sent to pathology. I then reinserted the resectoscope and resected the base of the tumor in order to obtain deeper tissue. In doing this I noted what appeared to be 3 distinct arterioles that were fulgurated but this configuration seemed to me for atypical bladder tumor. Throughout the procedure the right ureteral orifice was well away from the area I was resecting however when I looked at the base of where I had resected the lesion there appeared to be mucosa down inside a small pit. My concern was that I had resected into the intramural ureter.  I reinserted the cystoscope and passed a 5 [place] open-ended catheter into the right orifice. I was unable to advance it easily so I passed a 0.038 inch floppy tip sensor guidewire through the ureteral catheter and up the ureter and then passed the open ended  catheter over the guidewire several centimeters up the ureter. This was done so that I could then visualize the small pit that I identified and determine if this was in fact the ureteral lumen. I left the open-ended catheter in place and inserted a 70 lens and was able to look down into the small pit and was unable to see the open-ended catheter. I watched while I  pulled the catheter out and noted that the course of the ureter was lateral and inferior to the area that I was observing. 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 It appeared to have been associated with this lesion. Reinspection of the bladder revealed all obvious tumor had been fully resected and there was no evidence of perforation.  Because this lesion seemed quite different both in its apparent blood supply as well as association with what appeared to be some form of congenital anomaly I felt that the probability that this was high grade cancer was going to be low. It certainly could be a low-grade transitional cell carcinoma although it also could be a benign lesion and although the bladder was intact my decision was not to proceed with intravesical mitomycin C. postoperatively. The bladder was therefore drained and the cystoscope was removed.   The patient was awakened and taken to the recovery room.    PLAN OF CARE: Discharge to home after PACU  PATIENT DISPOSITION:  PACU - hemodynamically stable.

## 2011-12-27 NOTE — Anesthesia Procedure Notes (Signed)
Procedure Name: LMA Insertion Date/Time: 12/27/2011 10:37 AM Performed by: Norva Pavlov Pre-anesthesia Checklist: Patient identified, Emergency Drugs available, Suction available and Patient being monitored Patient Re-evaluated:Patient Re-evaluated prior to inductionOxygen Delivery Method: Circle System Utilized Preoxygenation: Pre-oxygenation with 100% oxygen Intubation Type: IV induction Ventilation: Mask ventilation without difficulty LMA: LMA inserted LMA Size: 4.0 Number of attempts: 1 Airway Equipment and Method: bite block Placement Confirmation: positive ETCO2 Tube secured with: Tape Dental Injury: Teeth and Oropharynx as per pre-operative assessment

## 2011-12-27 NOTE — Anesthesia Preprocedure Evaluation (Signed)
Anesthesia Evaluation  Patient identified by MRN, date of birth, ID band Patient awake    Reviewed: Allergy & Precautions, H&P , NPO status , Patient's Chart, lab work & pertinent test results, reviewed documented beta blocker date and time   Airway Mallampati: II TM Distance: >3 FB Neck ROM: Full    Dental  (+) Teeth Intact   Pulmonary sleep apnea and Continuous Positive Airway Pressure Ventilation ,  breath sounds clear to auscultation        Cardiovascular hypertension, Pt. on medications Rhythm:Regular Rate:Normal  Denies cardiac symptoms   Neuro/Psych negative neurological ROS  negative psych ROS   GI/Hepatic negative GI ROS, Neg liver ROS,   Endo/Other  negative endocrine ROS  Renal/GU negative Renal ROS   Bladder lesion     Musculoskeletal negative musculoskeletal ROS (+)   Abdominal   Peds negative pediatric ROS (+)  Hematology negative hematology ROS (+)   Anesthesia Other Findings   Reproductive/Obstetrics Hx Breast Cancer                           Anesthesia Physical Anesthesia Plan  ASA: III  Anesthesia Plan: General   Post-op Pain Management:    Induction: Intravenous  Airway Management Planned: LMA  Additional Equipment:   Intra-op Plan:   Post-operative Plan: Extubation in OR  Informed Consent: I have reviewed the patients History and Physical, chart, labs and discussed the procedure including the risks, benefits and alternatives for the proposed anesthesia with the patient or authorized representative who has indicated his/her understanding and acceptance.   Dental advisory given  Plan Discussed with: CRNA and Surgeon  Anesthesia Plan Comments:         Anesthesia Quick Evaluation

## 2011-12-28 ENCOUNTER — Encounter (HOSPITAL_COMMUNITY): Payer: Self-pay | Admitting: Adult Health

## 2011-12-28 ENCOUNTER — Emergency Department (HOSPITAL_COMMUNITY)
Admission: EM | Admit: 2011-12-28 | Discharge: 2011-12-28 | Disposition: A | Discharge status: Home / Self Care | Payer: BC Managed Care – PPO | Attending: Emergency Medicine | Admitting: Emergency Medicine

## 2011-12-28 DIAGNOSIS — I10 Essential (primary) hypertension: Secondary | ICD-10-CM | POA: Insufficient documentation

## 2011-12-28 DIAGNOSIS — R300 Dysuria: Secondary | ICD-10-CM | POA: Insufficient documentation

## 2011-12-28 DIAGNOSIS — G4733 Obstructive sleep apnea (adult) (pediatric): Secondary | ICD-10-CM | POA: Insufficient documentation

## 2011-12-28 DIAGNOSIS — T83091A Other mechanical complication of indwelling urethral catheter, initial encounter: Secondary | ICD-10-CM

## 2011-12-28 DIAGNOSIS — K5900 Constipation, unspecified: Secondary | ICD-10-CM | POA: Insufficient documentation

## 2011-12-28 DIAGNOSIS — Y846 Urinary catheterization as the cause of abnormal reaction of the patient, or of later complication, without mention of misadventure at the time of the procedure: Secondary | ICD-10-CM | POA: Insufficient documentation

## 2011-12-28 DIAGNOSIS — Z79899 Other long term (current) drug therapy: Secondary | ICD-10-CM | POA: Insufficient documentation

## 2011-12-28 DIAGNOSIS — T8389XA Other specified complication of genitourinary prosthetic devices, implants and grafts, initial encounter: Secondary | ICD-10-CM | POA: Insufficient documentation

## 2011-12-28 DIAGNOSIS — M129 Arthropathy, unspecified: Secondary | ICD-10-CM | POA: Insufficient documentation

## 2011-12-28 LAB — CBC
Hemoglobin: 11.1 g/dL — ABNORMAL LOW (ref 12.0–15.0)
MCH: 29.1 pg (ref 26.0–34.0)
MCV: 88.2 fL (ref 78.0–100.0)
RBC: 3.81 MIL/uL — ABNORMAL LOW (ref 3.87–5.11)

## 2011-12-28 LAB — URINE MICROSCOPIC-ADD ON

## 2011-12-28 LAB — URINALYSIS, ROUTINE W REFLEX MICROSCOPIC
Bilirubin Urine: NEGATIVE
Specific Gravity, Urine: 1.014 (ref 1.005–1.030)
Urobilinogen, UA: 1 mg/dL (ref 0.0–1.0)

## 2011-12-28 LAB — BASIC METABOLIC PANEL
BUN: 17 mg/dL (ref 6–23)
GFR calc non Af Amer: >90 mL/min (ref >90)
Glucose, Bld: 90 mg/dL (ref 70–99)
Potassium: 3.5 mEq/L (ref 3.5–5.1)

## 2011-12-28 LAB — DIFFERENTIAL
Eosinophils Absolute: 0 10*3/uL (ref 0.0–0.7)
Eosinophils Relative: 0 % (ref 0–5)
Lymphs Abs: 1.9 10*3/uL (ref 0.7–4.0)
Monocytes Relative: 8 % (ref 3–12)

## 2011-12-28 MED ORDER — DOCUSATE SODIUM 100 MG PO CAPS: 100.0000 mg | ORAL_CAPSULE | Freq: Two times a day (BID) | ORAL | Status: AC

## 2011-12-28 NOTE — Discharge Instructions (Signed)

## 2011-12-28 NOTE — ED Notes (Signed)
Post op from bladder surgery yesterday. Has a catheter in place, not draining urine, urine is spilling around catheter. C/o bladder feeling irritated and anxiousness. Took one hydrocodone at 10am

## 2011-12-28 NOTE — ED Provider Notes (Signed)
History     CSN: [id number]  Arrival date & time 12/28/11  1253   First MD Initiated Contact with Patient 12/28/11 1325      Chief Complaint  Patient presents with  . Post-op Problem    HPI The patient presents to the emergency room with complaints of Foley catheter complications. Patient had a transurethral resection of a tumor performed yesterday by her urologist. Patient left with a Foley catheter in place. Patient states this morning she was having trouble with urinating around the catheter. She feels like it's not draining properly and may have noticed some blood clots in her urine. Patient states she has a general dysuria type discomfort although as not having pain at this time.  She denies any fevers, vomiting or diarrhea. The symptoms increase whenever she has to urinate. Past Medical History  Diagnosis Date  . Hypertension   . Family history of breast cancer     aunt - fathers side  . Family history of colon cancer     mother, brother, aunt - mother's side  . Family history of lung cancer     father  . Normal cardiac stress test 08-10-2004  . OSA on CPAP   . Bladder tumor   . Blood transfusion   . Frequency of urination   . Urgency of urination   . Nocturia   . Arthritis KNEES AND HANDS  . DDD (degenerative disc disease), lumbar     Past Surgical History  Procedure Date  . Knee arthroscopy 04-02-2010    RIGHT  . Ganglion cyst excision     LEFT WRIST  . Left shoulder arthroscopy/ debridement labrum/ sad/ open distal clavicle resection 03-25-2011  . Rotator cuff repair 12-23-2008    RIGHT-- W/ TISSUEMEND GRAFT  . Total knee arthroplasty 09-16-2007    LEFT  . Knee arthroscopy w/ meniscectomy 02-20-2006    LEFT  . Cesarean section     X2  . Anterior cruciate ligament repair     LEFT  . Dilation and curettage of uterus     FOR MISSED AB  . Tubal ligation   . Breast surgery 07/10/11    left breast excisional biopsy-- benign  . Abdominal hysterectomy 1994   W/ LSO  . Tonsillectomy CHILD  . Transurethral resection of bladder tumor     Family History  Problem Relation Age of Onset  . Cancer Mother     colon  . Cancer Father     lung    History  Substance Use Topics  . Smoking status: Former Smoker -- 0.2 packs/day for 50 years    Types: Cigarettes    Quit date: 06/25/1980  . Smokeless tobacco: Never Used  . Alcohol Use: 4.2 oz/week    7 Glasses of wine per week    OB History    Grav Para Term Preterm Abortions TAB SAB Ect Mult Living                  Review of Systems  All other systems reviewed and are negative.    Allergies  Review of patient's allergies indicates no known allergies.  Home Medications   Current Outpatient Rx  Name Route Sig Dispense Refill  . ALPRAZOLAM 0.5 MG PO TABS Oral Take 0.25 mg by mouth daily as needed. For anxiety    . AMLODIPINE BESYLATE-VALSARTAN 10-320 MG PO TABS Oral Take 1 tablet by mouth daily.     . BUPROPION HCL ER (XL) 300 MG PO TB24 Oral  Take 300 mg by mouth daily.     . CELECOXIB 200 MG PO CAPS Oral Take 200 mg by mouth daily.     Marland Kitchen VITAMIN D3 2000 UNITS PO TABS Oral Take 2,000 Units by mouth daily.     Marland Kitchen ESTROGENS CONJUGATED 0.45 MG PO TABS Oral Take 0.45 mg by mouth daily. Take daily for 21 days then do not take for 7 days.    Marland Kitchen HYDROCODONE-ACETAMINOPHEN 7.5-325 MG PO TABS Oral Take 1-2 tablets by mouth every 4 (four) hours as needed for pain. 40 tablet 0  . PHENAZOPYRIDINE HCL 200 MG PO TABS Oral Take 1 tablet (200 mg total) by mouth 3 (three) times daily as needed for pain. 30 tablet 0  . ZOLPIDEM TARTRATE ER 12.5 MG PO TBCR Oral Take 12.5 mg by mouth at bedtime.       BP 132/67  Pulse 83  Temp(Src) 98.8 F (37.1 C) (Oral)  Resp 16  Ht 5' 3.5" (1.613 m)  Wt 143 lb (64.864 kg)  BMI 24.93 kg/m2  SpO2 98%  Physical Exam  Nursing note and vitals reviewed. Constitutional: She appears well-developed and well-nourished. No distress.  HENT:  Head: Normocephalic and  atraumatic.  Right Ear: External ear normal.  Left Ear: External ear normal.  Eyes: Conjunctivae are normal. Right eye exhibits no discharge. Left eye exhibits no discharge. No scleral icterus.  Neck: Neck supple. No tracheal deviation present.  Cardiovascular: Normal rate, regular rhythm and intact distal pulses.   Pulmonary/Chest: Effort normal and breath sounds normal. No stridor. No respiratory distress. She has no wheezes. She has no rales.  Abdominal: Soft. Bowel sounds are normal. She exhibits no distension. There is no tenderness. There is no rebound and no guarding.  Genitourinary:       Foley catheter in place, scant amount of orange urine in the Foley catheter that  Musculoskeletal: She exhibits no edema and no tenderness.  Neurological: She is alert. She has normal strength. No sensory deficit. Cranial nerve deficit:  no gross defecits noted. She exhibits normal muscle tone. She displays no seizure activity. Coordination normal.  Skin: Skin is warm and dry. No rash noted.  Psychiatric: She has a normal mood and affect.    ED Course  Procedures (including critical care time)  Labs Reviewed  CBC - Abnormal; Notable for the following:    RBC 3.81 (*)    Hemoglobin 11.1 (*)    HCT 33.6 (*)    All other components within normal limits  URINALYSIS, ROUTINE W REFLEX MICROSCOPIC - Abnormal; Notable for the following:    Color, Urine ORANGE (*) BIOCHEMICALS MAY BE AFFECTED BY COLOR   APPearance CLOUDY (*)    Hgb urine dipstick LARGE (*)    Nitrite POSITIVE (*)    Leukocytes, UA TRACE (*)    All other components within normal limits  DIFFERENTIAL  BASIC METABOLIC PANEL  URINE MICROSCOPIC-ADD ON   No results found.   1. Obstructed Foley catheter   2. Constipation       MDM  The patient feels much better after her urinary catheter was irrigated. It  now seems to be draining appropriately.  We explained to the patient has she can irrigate the catheter at home if it  becomes obstructed again. Patient also mentioned that she been having some constipation after her surgery. I will prescribe Colace and she can also take over-the-counter milk of magnesia.       Celene Kras, MD 12/28/11 [phone]

## 2011-12-30 ENCOUNTER — Encounter (HOSPITAL_BASED_OUTPATIENT_CLINIC_OR_DEPARTMENT_OTHER): Payer: Self-pay | Admitting: Urology

## 2012-03-23 ENCOUNTER — Other Ambulatory Visit: Payer: Self-pay | Admitting: Internal Medicine

## 2012-04-03 ENCOUNTER — Other Ambulatory Visit: Payer: Self-pay | Admitting: Obstetrics and Gynecology

## 2013-11-08 NOTE — Progress Notes (Signed)
Dr. Gladstone Lighter could you please put orders in [facility] as patient has pre-op appointment on 11/15/13 at 8 am! Thank you!

## 2013-11-12 NOTE — Patient Instructions (Addendum)
[name]  11/12/2013                           YOUR PROCEDURE IS SCHEDULED ON: 11/18/13               PLEASE REPORT TO SHORT STAY CENTER AT : 10:15AM               CALL THIS NUMBER IF ANY PROBLEMS THE DAY OF SURGERY :               [phone]                   NO VISITORS UNDER AGE 64 DUE TO FLU RESTRICTIONS                REMEMBER:   Do not eat food or drink liquids AFTER MIDNIGHT  May have clear liquids UNTIL 6 HOURS BEFORE SURGERY (6:45 AM)  Clear liquids include soda, tea, black coffee, apple or grape juice, broth.   Take these medicines the morning of surgery with A SIP OF WATER: WELLBUTRIN / PREMARIN //XANAX / HYDROCODONE IF NEEDED   Do not wear jewelry, make-up   Do not wear lotions, powders, or perfumes.   Do not shave legs or underarms 12 hrs. before surgery (men may shave face)  Do not bring valuables to the hospital.  Contacts, dentures or bridgework may not be worn into surgery.  Leave suitcase in the car. After surgery it may be brought to your room.  For patients admitted to the hospital more than one night, checkout time is 11:00                          The day of discharge.   Patients discharged the day of surgery will not be allowed to drive home                             If going home same day of surgery, must have someone stay with you first                           24 hrs at home and arrange for some one to drive you home from hospital.    Special Instructions:   Please read over the following fact sheets that you were given:               1. [name]               2. INCENTIVE SPIROMETER                3. MRSA INFORMATION                4. BRING C PAP [name]                                                X_____________________________________________________________________        Failure to follow these instructions may result in cancellation of your surgery

## 2013-11-15 ENCOUNTER — Encounter (HOSPITAL_COMMUNITY)
Admission: RE | Admit: 2013-11-15 | Discharge: 2013-11-15 | Disposition: A | Discharge status: Home / Self Care | Payer: Managed Care, Other (non HMO) | Source: Ambulatory Visit | Attending: Orthopedic Surgery | Admitting: Orthopedic Surgery

## 2013-11-15 ENCOUNTER — Encounter (INDEPENDENT_AMBULATORY_CARE_PROVIDER_SITE_OTHER): Payer: Self-pay

## 2013-11-15 ENCOUNTER — Encounter (HOSPITAL_COMMUNITY): Payer: Self-pay

## 2013-11-15 ENCOUNTER — Ambulatory Visit (HOSPITAL_COMMUNITY)
Admission: RE | Admit: 2013-11-15 | Discharge: 2013-11-15 | Disposition: A | Discharge status: Home / Self Care | Payer: Managed Care, Other (non HMO) | Source: Ambulatory Visit | Attending: Surgical | Admitting: Surgical

## 2013-11-15 ENCOUNTER — Encounter (HOSPITAL_COMMUNITY): Payer: Self-pay | Admitting: Pharmacy Technician

## 2013-11-15 DIAGNOSIS — Z01810 Encounter for preprocedural cardiovascular examination: Secondary | ICD-10-CM | POA: Insufficient documentation

## 2013-11-15 DIAGNOSIS — Z01818 Encounter for other preprocedural examination: Secondary | ICD-10-CM | POA: Insufficient documentation

## 2013-11-15 DIAGNOSIS — Z01812 Encounter for preprocedural laboratory examination: Secondary | ICD-10-CM | POA: Insufficient documentation

## 2013-11-15 DIAGNOSIS — D494 Neoplasm of unspecified behavior of bladder: Secondary | ICD-10-CM | POA: Insufficient documentation

## 2013-11-15 HISTORY — DX: Reserved for concepts with insufficient information to code with codable children: IMO0002

## 2013-11-15 HISTORY — DX: Anxiety disorder, unspecified: F41.9

## 2013-11-15 LAB — COMPREHENSIVE METABOLIC PANEL
ALT: 35 U/L (ref 0–35)
AST: 22 U/L (ref 0–37)
Albumin: 3.9 g/dL (ref 3.5–5.2)
Alkaline Phosphatase: 102 U/L (ref 39–117)
BUN: 22 mg/dL (ref 6–23)
CO2: 27 mEq/L (ref 19–32)
Calcium: 9.8 mg/dL (ref 8.4–10.5)
Chloride: 103 mEq/L (ref 96–112)
Creatinine, Ser: 0.58 mg/dL (ref 0.50–1.10)
GFR calc Af Amer: >90 mL/min (ref >90)
GFR calc non Af Amer: >90 mL/min (ref >90)
Glucose, Bld: 74 mg/dL (ref 70–99)
Potassium: 4.3 mEq/L (ref 3.7–5.3)
Sodium: 141 mEq/L (ref 137–147)
Total Bilirubin: <0.2 mg/dL — ABNORMAL LOW (ref 0.3–1.2)
Total Protein: 7.1 g/dL (ref 6.0–8.3)

## 2013-11-15 LAB — CBC WITH DIFFERENTIAL/PLATELET
Basophils Absolute: 0 10*3/uL (ref 0.0–0.1)
Basophils Relative: 0 % (ref 0–1)
Eosinophils Absolute: 0.1 10*3/uL (ref 0.0–0.7)
Eosinophils Relative: 1 % (ref 0–5)
HCT: 36.6 % (ref 36.0–46.0)
Hemoglobin: 12.4 g/dL (ref 12.0–15.0)
Lymphocytes Relative: 18 % (ref 12–46)
Lymphs Abs: 1.1 10*3/uL (ref 0.7–4.0)
MCH: 30.2 pg (ref 26.0–34.0)
MCHC: 33.9 g/dL (ref 30.0–36.0)
MCV: 89.3 fL (ref 78.0–100.0)
Monocytes Absolute: 0.7 10*3/uL (ref 0.1–1.0)
Monocytes Relative: 11 % (ref 3–12)
Neutro Abs: 4.2 10*3/uL (ref 1.7–7.7)
Neutrophils Relative %: 70 % (ref 43–77)
Platelets: 227 10*3/uL (ref 150–400)
RBC: 4.1 MIL/uL (ref 3.87–5.11)
RDW: 12.8 % (ref 11.5–15.5)
WBC: 6.1 10*3/uL (ref 4.0–10.5)

## 2013-11-15 LAB — URINALYSIS, ROUTINE W REFLEX MICROSCOPIC
Bilirubin Urine: NEGATIVE
Glucose, UA: NEGATIVE mg/dL
Hgb urine dipstick: NEGATIVE
Ketones, ur: NEGATIVE mg/dL
Leukocytes, UA: NEGATIVE
Nitrite: NEGATIVE
Protein, ur: NEGATIVE mg/dL
Specific Gravity, Urine: 1.012 (ref 1.005–1.030)
Urobilinogen, UA: 0.2 mg/dL (ref 0.0–1.0)
pH: 5.5 (ref 5.0–8.0)

## 2013-11-15 LAB — SURGICAL PCR SCREEN
MRSA, PCR: NEGATIVE
STAPHYLOCOCCUS AUREUS: NEGATIVE

## 2013-11-15 LAB — APTT: aPTT: 27 seconds (ref 24–37)

## 2013-11-15 LAB — PROTIME-INR
INR: 0.91 (ref 0.00–1.49)
Prothrombin Time: 12.1 seconds (ref 11.6–15.2)

## 2013-11-16 ENCOUNTER — Other Ambulatory Visit: Payer: Self-pay | Admitting: Surgical

## 2013-11-17 NOTE — H&P (Signed)
TOTAL KNEE ADMISSION H&P  Patient is being admitted for right total knee arthroplasty.  Subjective:  Chief Complaint:right knee pain.  HPI: [name], 63 y.o. female, has a history of pain and functional disability in the right knee due to arthritis and has failed non-surgical conservative treatments for greater than 12 weeks to includeNSAID's and/or analgesics, corticosteriod injections and activity modification.  Onset of symptoms was gradual, starting 4 years ago with gradually worsening course since that time. The patient noted prior procedures on the knee to include  arthroscopy and menisectomy on the right knee(s).  Patient currently rates pain in the right knee(s) at 7 out of 10 with activity. Patient has night pain, worsening of pain with activity and weight bearing, pain that interferes with activities of daily living, pain with passive range of motion, crepitus and joint swelling.  Patient has evidence of periarticular osteophytes and joint space narrowing by imaging studies.  There is no active infection.  Patient Active Problem List   Diagnosis Date Noted  . Bladder tumor 12/27/2011  . Breast mass seen on mammogram 06/26/2011   Past Medical History  Diagnosis Date  . Hypertension   . Normal cardiac stress test 08-10-2004  . Bladder tumor   . Blood transfusion   . Urgency of urination   . Arthritis KNEES AND HANDS  . DDD (degenerative disc disease), lumbar   . DDD (degenerative disc disease)   . OSA on CPAP   . Anxiety     Past Surgical History  Procedure Laterality Date  . Knee arthroscopy  04-02-2010    RIGHT  . Ganglion cyst excision      LEFT WRIST  . Left shoulder arthroscopy/ debridement labrum/ sad/ open distal clavicle resection  03-25-2011  . Rotator cuff repair  12-23-2008    RIGHT-- W/ TISSUEMEND GRAFT  . Total knee arthroplasty  09-16-2007    LEFT  . Knee arthroscopy w/ meniscectomy  02-20-2006    LEFT  . Cesarean section      X2  . Anterior  cruciate ligament repair      LEFT  . Dilation and curettage of uterus      FOR MISSED AB  . Tubal ligation    . Breast surgery  07/10/11    left breast excisional biopsy-- benign  . Abdominal hysterectomy  1994    W/ LSO  . Tonsillectomy  CHILD  . Transurethral resection of bladder tumor  12/27/2011    Procedure: TRANSURETHRAL RESECTION OF BLADDER TUMOR (TURBT);  Surgeon: Claybon Jabs, MD;  Location: [facility];  Service: Urology;  Laterality: N/A;  mytomicin c  gyrus      Current outpatient prescriptions: acetaminophen (TYLENOL) 500 MG tablet, Take 1,000 mg by mouth every 8 (eight) hours as needed for moderate pain. , Disp: , Rfl: ;   ALPRAZolam (XANAX) 0.5 MG tablet, Take 0.5 mg by mouth daily as needed for anxiety. For anxiety, Disp: , Rfl: ;   Amlodipine-Valsartan-HCTZ (EXFORGE HCT) 10-320-25 MG TABS, Take 1 tablet by mouth every morning., Disp: , Rfl:  buPROPion (WELLBUTRIN XL) 300 MG 24 hr tablet, Take 300 mg by mouth every morning. , Disp: , Rfl: ;   celecoxib (CELEBREX) 200 MG capsule, Take 200 mg by mouth daily. , Disp: , Rfl: ;   estrogens, conjugated, (PREMARIN) 0.45 MG tablet, Take 0.45 mg by mouth daily. ., Disp: , Rfl: ;   sertraline (ZOLOFT) 25 MG tablet, Take 25 mg by mouth at bedtime., Disp: ,  Rfl:  zolpidem (AMBIEN CR) 12.5 MG CR tablet, Take 12.5 mg by mouth at bedtime. , Disp: , Rfl: ;   HYDROcodone-acetaminophen (NORCO/VICODIN) 5-325 MG per tablet, Take 1 tablet by mouth every 6 (six) hours as needed for moderate pain., Disp:  nystatin-triamcinolone (MYCOLOG II) cream, Apply 1 application topically 2 (two) times daily., Disp: , Rfl:   No Known Allergies  History  Substance Use Topics  . Smoking status: Former Smoker -- 0.25 packs/day for 50 years    Types: Cigarettes    Quit date: 06/25/1980  . Smokeless tobacco: Never Used  . Alcohol Use: 0.0 oz/week     Comment: occasional    Family History  Problem Relation Age of Onset  . Cancer Mother      colon  . Cancer Father     lung     Review of Systems  Constitutional: Negative.   HENT: Negative.   Eyes: Negative.   Respiratory: Negative.   Cardiovascular: Negative.   Gastrointestinal: Negative.   Genitourinary: Negative.   Musculoskeletal: Positive for back pain and joint pain. Negative for falls, myalgias and neck pain.       Right knee pain; right CMC joint pain  Skin: Negative.   Neurological: Negative.   Endo/Heme/Allergies: Negative.   Psychiatric/Behavioral: Negative.     Objective:  Physical Exam  Constitutional: She is oriented to person, place, and time. She appears well-developed and well-nourished. No distress.  HENT:  Head: Normocephalic and atraumatic.  Right Ear: External ear normal.  Left Ear: External ear normal.  Nose: Nose normal.  Mouth/Throat: Oropharynx is clear and moist.  Eyes: Conjunctivae and EOM are normal.  Neck: Normal range of motion. Neck supple.  Cardiovascular: Normal rate, regular rhythm, normal heart sounds and intact distal pulses.   No murmur heard. Respiratory: Effort normal and breath sounds normal. No respiratory distress. She has no wheezes.  GI: Soft. Bowel sounds are normal. She exhibits no distension. There is no tenderness.  Musculoskeletal:       Right hip: Normal.       Left hip: Normal.       Right knee: She exhibits decreased range of motion and swelling. She exhibits no effusion and no erythema. Tenderness found. Medial joint line and lateral joint line tenderness noted.       Left knee: Normal.       Left hand: She exhibits tenderness and bony tenderness. She exhibits normal range of motion, normal capillary refill, no deformity and no swelling.       Hands:      Right lower leg: She exhibits no tenderness and no swelling.       Left lower leg: She exhibits no tenderness and no swelling.  Neurological: She is alert and oriented to person, place, and time. She has normal strength and normal reflexes. No sensory  deficit.  Skin: No rash noted. She is not diaphoretic. No erythema.  Psychiatric: She has a normal mood and affect. Her behavior is normal.    Vitals Weight: 146 lb Height: 63 in Body Surface Area: 1.72 m Body Mass Index: 25.86 kg/m Pulse: 78 (Regular) BP: 106/59 (Sitting, Left Arm, Standard)  Imaging Review Plain radiographs demonstrate severe degenerative joint disease of the right knee(s). The overall alignment ismild varus. The bone quality appears to be good for age and reported activity level.  Assessment/Plan:  End stage arthritis, right knee   The patient history, physical examination, clinical judgment of the provider and imaging studies are consistent  with end stage degenerative joint disease of the right knee(s) and total knee arthroplasty is deemed medically necessary. The treatment options including medical management, injection therapy arthroscopy and arthroplasty were discussed at length. The risks and benefits of total knee arthroplasty were presented and reviewed. The risks due to aseptic loosening, infection, stiffness, patella tracking problems, thromboembolic complications and other imponderables were discussed. The patient acknowledged the explanation, agreed to proceed with the plan and consent was signed. Patient is being admitted for inpatient treatment for surgery, pain control, PT, OT, prophylactic antibiotics, VTE prophylaxis, progressive ambulation and ADL's and discharge planning. The patient is planning to be discharged home with home health services      [place], [place]

## 2013-11-18 ENCOUNTER — Inpatient Hospital Stay (HOSPITAL_COMMUNITY): Payer: Managed Care, Other (non HMO) | Admitting: Anesthesiology

## 2013-11-18 ENCOUNTER — Inpatient Hospital Stay (HOSPITAL_COMMUNITY): Payer: Managed Care, Other (non HMO)

## 2013-11-18 ENCOUNTER — Encounter (HOSPITAL_COMMUNITY): Payer: Self-pay | Admitting: *Deleted

## 2013-11-18 ENCOUNTER — Encounter (HOSPITAL_COMMUNITY)
Admission: RE | Disposition: A | Discharge status: Home Health | Payer: Self-pay | Source: Ambulatory Visit | Attending: Orthopedic Surgery

## 2013-11-18 ENCOUNTER — Encounter (HOSPITAL_COMMUNITY): Payer: Managed Care, Other (non HMO) | Admitting: Anesthesiology

## 2013-11-18 ENCOUNTER — Inpatient Hospital Stay (HOSPITAL_COMMUNITY)
Admission: RE | Admit: 2013-11-18 | Discharge: 2013-11-20 | DRG: 470 | Disposition: A | Discharge status: Home Health | Payer: Managed Care, Other (non HMO) | Source: Ambulatory Visit | Attending: Orthopedic Surgery | Admitting: Orthopedic Surgery

## 2013-11-18 DIAGNOSIS — M1710 Unilateral primary osteoarthritis, unspecified knee: Principal | ICD-10-CM | POA: Diagnosis present

## 2013-11-18 DIAGNOSIS — M19049 Primary osteoarthritis, unspecified hand: Secondary | ICD-10-CM | POA: Diagnosis present

## 2013-11-18 DIAGNOSIS — Z87891 Personal history of nicotine dependence: Secondary | ICD-10-CM

## 2013-11-18 DIAGNOSIS — I10 Essential (primary) hypertension: Secondary | ICD-10-CM | POA: Diagnosis present

## 2013-11-18 DIAGNOSIS — G4733 Obstructive sleep apnea (adult) (pediatric): Secondary | ICD-10-CM | POA: Diagnosis present

## 2013-11-18 DIAGNOSIS — Z853 Personal history of malignant neoplasm of breast: Secondary | ICD-10-CM

## 2013-11-18 DIAGNOSIS — M1711 Unilateral primary osteoarthritis, right knee: Secondary | ICD-10-CM | POA: Diagnosis present

## 2013-11-18 DIAGNOSIS — M21169 Varus deformity, not elsewhere classified, unspecified knee: Secondary | ICD-10-CM | POA: Diagnosis present

## 2013-11-18 DIAGNOSIS — Z96652 Presence of left artificial knee joint: Secondary | ICD-10-CM

## 2013-11-18 DIAGNOSIS — Z6825 Body mass index (BMI) 25.0-25.9, adult: Secondary | ICD-10-CM

## 2013-11-18 DIAGNOSIS — Z96659 Presence of unspecified artificial knee joint: Secondary | ICD-10-CM

## 2013-11-18 DIAGNOSIS — F411 Generalized anxiety disorder: Secondary | ICD-10-CM | POA: Diagnosis present

## 2013-11-18 DIAGNOSIS — D62 Acute posthemorrhagic anemia: Secondary | ICD-10-CM | POA: Diagnosis not present

## 2013-11-18 HISTORY — PX: TOTAL KNEE ARTHROPLASTY: SHX125

## 2013-11-18 LAB — TYPE AND SCREEN
ABO/RH(D): A POS
Antibody Screen: NEGATIVE

## 2013-11-18 SURGERY — ARTHROPLASTY, KNEE, TOTAL
Anesthesia: General | Site: Knee | Laterality: Right

## 2013-11-18 MED ORDER — LACTATED RINGERS IV SOLN
INTRAVENOUS | Status: DC
  Administered 2013-11-18 (×2): via INTRAVENOUS

## 2013-11-18 MED ORDER — HYDROMORPHONE HCL PF 1 MG/ML IJ SOLN
INTRAMUSCULAR | Status: DC | PRN
  Administered 2013-11-18 (×2): 1 mg via INTRAVENOUS

## 2013-11-18 MED ORDER — PROPOFOL 10 MG/ML IV BOLUS
INTRAVENOUS | Status: DC | PRN
  Administered 2013-11-18: 130 mg via INTRAVENOUS

## 2013-11-18 MED ORDER — POLYMYXIN B SULFATE 500000 UNITS IJ SOLR
INTRAMUSCULAR | Status: DC | PRN
  Administered 2013-11-18: 14:00:00

## 2013-11-18 MED ORDER — FENTANYL CITRATE 0.05 MG/ML IJ SOLN
INTRAMUSCULAR | Status: DC | PRN
  Administered 2013-11-18 (×2): 50 ug via INTRAVENOUS

## 2013-11-18 MED ORDER — POLYETHYLENE GLYCOL 3350 17 G PO PACK: 17.0000 g | PACK | Freq: Every day | ORAL | Status: DC | PRN

## 2013-11-18 MED ORDER — FERROUS SULFATE 325 (65 FE) MG PO TABS
325.0000 mg | ORAL_TABLET | Freq: Three times a day (TID) | ORAL | Status: DC
  Administered 2013-11-18 – 2013-11-20 (×5): 325 mg via ORAL
  Filled 2013-11-18 (×8): qty 1

## 2013-11-18 MED ORDER — LACTATED RINGERS IV SOLN
INTRAVENOUS | Status: DC
  Administered 2013-11-18 – 2013-11-19 (×2): via INTRAVENOUS

## 2013-11-18 MED ORDER — THROMBIN 5000 UNITS EX SOLR
CUTANEOUS | Status: AC
  Filled 2013-11-18: qty 10000

## 2013-11-18 MED ORDER — ZOLPIDEM TARTRATE 10 MG PO TABS
5.0000 mg | ORAL_TABLET | Freq: Every day | ORAL | Status: DC
  Administered 2013-11-18 – 2013-11-19 (×2): 5 mg via ORAL
  Filled 2013-11-18 (×2): qty 1

## 2013-11-18 MED ORDER — ONDANSETRON HCL 4 MG/2ML IJ SOLN
INTRAMUSCULAR | Status: DC | PRN
  Administered 2013-11-18: 4 mg via INTRAVENOUS

## 2013-11-18 MED ORDER — HYDROMORPHONE HCL PF 1 MG/ML IJ SOLN
0.2500 mg | INTRAMUSCULAR | Status: DC | PRN
  Administered 2013-11-18 (×4): 0.5 mg via INTRAVENOUS

## 2013-11-18 MED ORDER — LIDOCAINE HCL (CARDIAC) 20 MG/ML IV SOLN
INTRAVENOUS | Status: AC
  Filled 2013-11-18: qty 5

## 2013-11-18 MED ORDER — HYDROMORPHONE HCL PF 1 MG/ML IJ SOLN
1.0000 mg | INTRAMUSCULAR | Status: DC | PRN
  Administered 2013-11-18 – 2013-11-20 (×7): 1 mg via INTRAVENOUS
  Filled 2013-11-18 (×7): qty 1

## 2013-11-18 MED ORDER — PHENOL 1.4 % MT LIQD: 1.0000 | OROMUCOSAL | Status: DC | PRN

## 2013-11-18 MED ORDER — AMLODIPINE-VALSARTAN-HCTZ 10-320-25 MG PO TABS: 1.0000 | ORAL_TABLET | Freq: Every morning | ORAL | Status: DC

## 2013-11-18 MED ORDER — SODIUM CHLORIDE 0.9 % IJ SOLN
INTRAMUSCULAR | Status: AC
  Filled 2013-11-18: qty 50

## 2013-11-18 MED ORDER — SERTRALINE HCL 25 MG PO TABS
25.0000 mg | ORAL_TABLET | Freq: Every day | ORAL | Status: DC
  Administered 2013-11-19: 25 mg via ORAL
  Filled 2013-11-18 (×3): qty 1

## 2013-11-18 MED ORDER — CHLORHEXIDINE GLUCONATE CLOTH 2 % EX PADS: 6.0000 | MEDICATED_PAD | Freq: Once | CUTANEOUS | Status: DC

## 2013-11-18 MED ORDER — MENTHOL 3 MG MT LOZG: 1.0000 | LOZENGE | OROMUCOSAL | Status: DC | PRN

## 2013-11-18 MED ORDER — LACTATED RINGERS IV SOLN: INTRAVENOUS | Status: DC

## 2013-11-18 MED ORDER — LIDOCAINE HCL (CARDIAC) 20 MG/ML IV SOLN
INTRAVENOUS | Status: DC | PRN
  Administered 2013-11-18: 100 mg via INTRAVENOUS

## 2013-11-18 MED ORDER — IRBESARTAN 300 MG PO TABS
300.0000 mg | ORAL_TABLET | Freq: Every day | ORAL | Status: DC
  Administered 2013-11-19 – 2013-11-20 (×2): 300 mg via ORAL
  Filled 2013-11-18 (×2): qty 1

## 2013-11-18 MED ORDER — BISACODYL 10 MG RE SUPP: 10.0000 mg | Freq: Every day | RECTAL | Status: DC | PRN

## 2013-11-18 MED ORDER — BUPIVACAINE HCL (PF) 0.5 % IJ SOLN
INTRAMUSCULAR | Status: AC
  Filled 2013-11-18: qty 30

## 2013-11-18 MED ORDER — ACETAMINOPHEN 325 MG PO TABS
650.0000 mg | ORAL_TABLET | Freq: Four times a day (QID) | ORAL | Status: DC | PRN
  Administered 2013-11-19 – 2013-11-20 (×2): 650 mg via ORAL
  Filled 2013-11-18 (×2): qty 2

## 2013-11-18 MED ORDER — DEXAMETHASONE SODIUM PHOSPHATE 10 MG/ML IJ SOLN
INTRAMUSCULAR | Status: AC
  Filled 2013-11-18: qty 1

## 2013-11-18 MED ORDER — BUPROPION HCL ER (XL) 300 MG PO TB24
300.0000 mg | ORAL_TABLET | Freq: Every morning | ORAL | Status: DC
  Administered 2013-11-19 – 2013-11-20 (×2): 300 mg via ORAL
  Filled 2013-11-18 (×2): qty 1

## 2013-11-18 MED ORDER — HYDROCODONE-ACETAMINOPHEN 5-325 MG PO TABS
1.0000 | ORAL_TABLET | ORAL | Status: DC | PRN
  Administered 2013-11-18 – 2013-11-19 (×7): 2 via ORAL
  Filled 2013-11-18 (×8): qty 2

## 2013-11-18 MED ORDER — AMLODIPINE BESYLATE 10 MG PO TABS
10.0000 mg | ORAL_TABLET | Freq: Every day | ORAL | Status: DC
  Administered 2013-11-19 – 2013-11-20 (×2): 10 mg via ORAL
  Filled 2013-11-18 (×2): qty 1

## 2013-11-18 MED ORDER — HYDROCHLOROTHIAZIDE 25 MG PO TABS
25.0000 mg | ORAL_TABLET | Freq: Every day | ORAL | Status: DC
  Administered 2013-11-19 – 2013-11-20 (×2): 25 mg via ORAL
  Filled 2013-11-18 (×2): qty 1

## 2013-11-18 MED ORDER — ALPRAZOLAM 0.5 MG PO TABS: 0.5000 mg | ORAL_TABLET | Freq: Every day | ORAL | Status: DC | PRN

## 2013-11-18 MED ORDER — ACETAMINOPHEN 650 MG RE SUPP: 650.0000 mg | Freq: Four times a day (QID) | RECTAL | Status: DC | PRN

## 2013-11-18 MED ORDER — CELECOXIB 200 MG PO CAPS
200.0000 mg | ORAL_CAPSULE | Freq: Two times a day (BID) | ORAL | Status: DC
  Administered 2013-11-18 – 2013-11-20 (×4): 200 mg via ORAL
  Filled 2013-11-18 (×5): qty 1

## 2013-11-18 MED ORDER — METHOCARBAMOL 100 MG/ML IJ SOLN
500.0000 mg | Freq: Four times a day (QID) | INTRAVENOUS | Status: DC | PRN
  Administered 2013-11-18: 500 mg via INTRAVENOUS
  Filled 2013-11-18: qty 5

## 2013-11-18 MED ORDER — ACETAMINOPHEN 10 MG/ML IV SOLN
1000.0000 mg | Freq: Once | INTRAVENOUS | Status: AC
  Administered 2013-11-18: 1000 mg via INTRAVENOUS
  Filled 2013-11-18: qty 100

## 2013-11-18 MED ORDER — KETAMINE HCL 10 MG/ML IJ SOLN
INTRAMUSCULAR | Status: DC | PRN
  Administered 2013-11-18: 10 mg via INTRAVENOUS
  Administered 2013-11-18: 20 mg via INTRAVENOUS
  Administered 2013-11-18 (×2): 10 mg via INTRAVENOUS

## 2013-11-18 MED ORDER — CISATRACURIUM BESYLATE (PF) 10 MG/5ML IV SOLN
INTRAVENOUS | Status: DC | PRN
  Administered 2013-11-18: 6 mg via INTRAVENOUS

## 2013-11-18 MED ORDER — PROMETHAZINE HCL 25 MG/ML IJ SOLN: 6.2500 mg | INTRAMUSCULAR | Status: DC | PRN

## 2013-11-18 MED ORDER — FENTANYL CITRATE 0.05 MG/ML IJ SOLN
INTRAMUSCULAR | Status: AC
  Filled 2013-11-18: qty 2

## 2013-11-18 MED ORDER — ONDANSETRON HCL 4 MG/2ML IJ SOLN: 4.0000 mg | Freq: Four times a day (QID) | INTRAMUSCULAR | Status: DC | PRN

## 2013-11-18 MED ORDER — BACITRACIN ZINC 500 UNIT/GM EX OINT
TOPICAL_OINTMENT | CUTANEOUS | Status: AC
  Filled 2013-11-18: qty 28.35

## 2013-11-18 MED ORDER — FLEET ENEMA 7-19 GM/118ML RE ENEM: 1.0000 | ENEMA | Freq: Once | RECTAL | Status: AC | PRN

## 2013-11-18 MED ORDER — OXYCODONE-ACETAMINOPHEN 5-325 MG PO TABS: 2.0000 | ORAL_TABLET | ORAL | Status: DC | PRN

## 2013-11-18 MED ORDER — METHYLPREDNISOLONE ACETATE 40 MG/ML IJ SUSP
INTRAMUSCULAR | Status: AC
  Filled 2013-11-18: qty 2

## 2013-11-18 MED ORDER — MIDAZOLAM HCL 2 MG/2ML IJ SOLN
INTRAMUSCULAR | Status: AC
  Filled 2013-11-18: qty 2

## 2013-11-18 MED ORDER — THROMBIN 5000 UNITS EX SOLR
CUTANEOUS | Status: DC | PRN
  Administered 2013-11-18: 5000 [IU] via TOPICAL

## 2013-11-18 MED ORDER — DEXAMETHASONE SODIUM PHOSPHATE 10 MG/ML IJ SOLN
INTRAMUSCULAR | Status: DC | PRN
  Administered 2013-11-18: 10 mg via INTRAVENOUS

## 2013-11-18 MED ORDER — CISATRACURIUM BESYLATE 20 MG/10ML IV SOLN
INTRAVENOUS | Status: AC
  Filled 2013-11-18: qty 10

## 2013-11-18 MED ORDER — ALUM & MAG HYDROXIDE-SIMETH 200-200-20 MG/5ML PO SUSP: 30.0000 mL | ORAL | Status: DC | PRN

## 2013-11-18 MED ORDER — SODIUM CHLORIDE 0.9 % IJ SOLN
INTRAMUSCULAR | Status: AC
  Filled 2013-11-18: qty 10

## 2013-11-18 MED ORDER — CEFAZOLIN SODIUM-DEXTROSE 2-3 GM-% IV SOLR
INTRAVENOUS | Status: AC
  Filled 2013-11-18: qty 50

## 2013-11-18 MED ORDER — SUCCINYLCHOLINE CHLORIDE 20 MG/ML IJ SOLN
INTRAMUSCULAR | Status: DC | PRN
  Administered 2013-11-18: 80 mg via INTRAVENOUS

## 2013-11-18 MED ORDER — HYDROMORPHONE HCL PF 1 MG/ML IJ SOLN
INTRAMUSCULAR | Status: AC
  Filled 2013-11-18: qty 1

## 2013-11-18 MED ORDER — KETAMINE HCL 10 MG/ML IJ SOLN
INTRAMUSCULAR | Status: AC
  Filled 2013-11-18: qty 1

## 2013-11-18 MED ORDER — CEFAZOLIN SODIUM 1-5 GM-% IV SOLN
1.0000 g | Freq: Four times a day (QID) | INTRAVENOUS | Status: AC
  Administered 2013-11-18 (×2): 1 g via INTRAVENOUS
  Filled 2013-11-18 (×2): qty 50

## 2013-11-18 MED ORDER — RIVAROXABAN 10 MG PO TABS
10.0000 mg | ORAL_TABLET | Freq: Every day | ORAL | Status: DC
  Administered 2013-11-19 – 2013-11-20 (×2): 10 mg via ORAL
  Filled 2013-11-18 (×3): qty 1

## 2013-11-18 MED ORDER — ESTROGENS CONJUGATED 0.45 MG PO TABS
0.4500 mg | ORAL_TABLET | Freq: Every day | ORAL | Status: DC
  Administered 2013-11-19 – 2013-11-20 (×2): 0.45 mg via ORAL
  Filled 2013-11-18 (×2): qty 1

## 2013-11-18 MED ORDER — AMLODIPINE BESYLATE 10 MG PO TABS
10.0000 mg | ORAL_TABLET | Freq: Once | ORAL | Status: AC
  Administered 2013-11-18: 10 mg via ORAL
  Filled 2013-11-18: qty 1

## 2013-11-18 MED ORDER — MIDAZOLAM HCL 5 MG/5ML IJ SOLN
INTRAMUSCULAR | Status: DC | PRN
  Administered 2013-11-18: 2 mg via INTRAVENOUS

## 2013-11-18 MED ORDER — SUFENTANIL CITRATE 50 MCG/ML IV SOLN
INTRAVENOUS | Status: DC | PRN
  Administered 2013-11-18: 10 ug via INTRAVENOUS
  Administered 2013-11-18: 20 ug via INTRAVENOUS
  Administered 2013-11-18 (×2): 10 ug via INTRAVENOUS

## 2013-11-18 MED ORDER — SUFENTANIL CITRATE 50 MCG/ML IV SOLN
INTRAVENOUS | Status: AC
  Filled 2013-11-18: qty 1

## 2013-11-18 MED ORDER — METHOCARBAMOL 500 MG PO TABS
500.0000 mg | ORAL_TABLET | Freq: Four times a day (QID) | ORAL | Status: DC | PRN
  Administered 2013-11-18 – 2013-11-20 (×4): 500 mg via ORAL
  Filled 2013-11-18 (×4): qty 1

## 2013-11-18 MED ORDER — HYDROMORPHONE HCL PF 2 MG/ML IJ SOLN
INTRAMUSCULAR | Status: AC
  Filled 2013-11-18: qty 1

## 2013-11-18 MED ORDER — PROPOFOL 10 MG/ML IV BOLUS
INTRAVENOUS | Status: AC
  Filled 2013-11-18: qty 20

## 2013-11-18 MED ORDER — CEFAZOLIN SODIUM-DEXTROSE 2-3 GM-% IV SOLR
2.0000 g | INTRAVENOUS | Status: AC
  Administered 2013-11-18: 2 g via INTRAVENOUS

## 2013-11-18 MED ORDER — ONDANSETRON HCL 4 MG PO TABS: 4.0000 mg | ORAL_TABLET | Freq: Four times a day (QID) | ORAL | Status: DC | PRN

## 2013-11-18 MED ORDER — ONDANSETRON HCL 4 MG/2ML IJ SOLN
INTRAMUSCULAR | Status: AC
  Filled 2013-11-18: qty 2

## 2013-11-18 MED ORDER — METHYLPREDNISOLONE ACETATE 40 MG/ML IJ SUSP
INTRAMUSCULAR | Status: DC | PRN
  Administered 2013-11-18 (×2): 40 mg

## 2013-11-18 MED ORDER — BUPIVACAINE LIPOSOME 1.3 % IJ SUSP
20.0000 mL | Freq: Once | INTRAMUSCULAR | Status: AC
  Administered 2013-11-18: 20 mL
  Filled 2013-11-18: qty 20

## 2013-11-18 SURGICAL SUPPLY — 63 items
BAG ZIPLOCK 12X15 (MISCELLANEOUS) ×2 IMPLANT
BANDAGE ELASTIC 4 VELCRO ST LF (GAUZE/BANDAGES/DRESSINGS) ×2 IMPLANT
BANDAGE ELASTIC 6 VELCRO ST LF (GAUZE/BANDAGES/DRESSINGS) ×2 IMPLANT
BANDAGE ESMARK 6X9 LF (GAUZE/BANDAGES/DRESSINGS) ×1 IMPLANT
BLADE SAG 18X100X1.27 (BLADE) ×2 IMPLANT
BLADE SAW SGTL 11.0X1.19X90.0M (BLADE) ×2 IMPLANT
BNDG ESMARK 6X9 LF (GAUZE/BANDAGES/DRESSINGS) ×2
BONE CEMENT GENTAMICIN (Cement) ×4 IMPLANT
CAPT RP KNEE ×2 IMPLANT
CEMENT BONE GENTAMICIN 40 (Cement) ×2 IMPLANT
CUFF TOURN SGL QUICK 34 (TOURNIQUET CUFF) ×1
CUFF TRNQT CYL 34X4X40X1 (TOURNIQUET CUFF) ×1 IMPLANT
DERMABOND ADVANCED (GAUZE/BANDAGES/DRESSINGS) ×1
DERMABOND ADVANCED .7 DNX12 (GAUZE/BANDAGES/DRESSINGS) ×1 IMPLANT
DRAPE EXTREMITY T 121X128X90 (DRAPE) ×2 IMPLANT
DRAPE INCISE IOBAN 66X45 STRL (DRAPES) ×2 IMPLANT
DRAPE LG THREE QUARTER DISP (DRAPES) ×2 IMPLANT
DRAPE POUCH INSTRU U-SHP 10X18 (DRAPES) ×2 IMPLANT
DRAPE U-SHAPE 47X51 STRL (DRAPES) ×2 IMPLANT
DRSG AQUACEL AG ADV 3.5X10 (GAUZE/BANDAGES/DRESSINGS) ×2 IMPLANT
DRSG TEGADERM 4X4.75 (GAUZE/BANDAGES/DRESSINGS) ×2 IMPLANT
DURAPREP 26ML APPLICATOR (WOUND CARE) ×2 IMPLANT
ELECT REM PT RETURN 9FT ADLT (ELECTROSURGICAL) ×2
ELECTRODE REM PT RTRN 9FT ADLT (ELECTROSURGICAL) ×1 IMPLANT
EVACUATOR 1/8 PVC DRAIN (DRAIN) ×2 IMPLANT
FACESHIELD LNG OPTICON STERILE (SAFETY) ×10 IMPLANT
GAUZE SPONGE 2X2 8PLY STRL LF (GAUZE/BANDAGES/DRESSINGS) ×1 IMPLANT
GLOVE BIOGEL PI IND STRL 8 (GLOVE) ×1 IMPLANT
GLOVE BIOGEL PI INDICATOR 8 (GLOVE) ×1
GLOVE ECLIPSE 8.0 STRL XLNG CF (GLOVE) ×4 IMPLANT
GLOVE SURG SS PI 6.5 STRL IVOR (GLOVE) ×4 IMPLANT
GOWN STRL REUS W/TWL LRG LVL3 (GOWN DISPOSABLE) ×2 IMPLANT
GOWN STRL REUS W/TWL XL LVL3 (GOWN DISPOSABLE) ×2 IMPLANT
HANDPIECE INTERPULSE COAX TIP (DISPOSABLE) ×1
IMMOBILIZER KNEE 20 (SOFTGOODS) ×4 IMPLANT
IMMOBILIZER KNEE 20 THIGH 36 (SOFTGOODS) ×1 IMPLANT
KIT BASIN OR (CUSTOM PROCEDURE TRAY) ×2 IMPLANT
MANIFOLD NEPTUNE II (INSTRUMENTS) ×2 IMPLANT
NEEDLE HYPO 22GX1.5 SAFETY (NEEDLE) ×2 IMPLANT
NS IRRIG 1000ML POUR BTL (IV SOLUTION) ×2 IMPLANT
PACK TOTAL JOINT (CUSTOM PROCEDURE TRAY) ×2 IMPLANT
PAD ABD 8X10 STRL (GAUZE/BANDAGES/DRESSINGS) IMPLANT
PADDING CAST COTTON 6X4 STRL (CAST SUPPLIES) ×2 IMPLANT
POSITIONER SURGICAL ARM (MISCELLANEOUS) ×2 IMPLANT
SET HNDPC FAN SPRY TIP SCT (DISPOSABLE) ×1 IMPLANT
SPONGE GAUZE 2X2 STER 10/PKG (GAUZE/BANDAGES/DRESSINGS) ×1
SPONGE LAP 18X18 X RAY DECT (DISPOSABLE) ×2 IMPLANT
SPONGE SURGIFOAM ABS GEL 100 (HEMOSTASIS) ×2 IMPLANT
STAPLER VISISTAT 35W (STAPLE) ×2 IMPLANT
SUCTION FRAZIER 12FR DISP (SUCTIONS) ×2 IMPLANT
SUT BONE WAX W31G (SUTURE) ×2 IMPLANT
SUT MNCRL AB 4-0 PS2 18 (SUTURE) ×2 IMPLANT
SUT VIC AB 1 CT1 27 (SUTURE) ×2
SUT VIC AB 1 CT1 27XBRD ANTBC (SUTURE) ×2 IMPLANT
SUT VIC AB 2-0 CT1 27 (SUTURE) ×3
SUT VIC AB 2-0 CT1 TAPERPNT 27 (SUTURE) ×3 IMPLANT
SUT VLOC 180 0 24IN GS25 (SUTURE) ×2 IMPLANT
SYR 20CC LL (SYRINGE) ×2 IMPLANT
TOWEL OR 17X26 10 PK STRL BLUE (TOWEL DISPOSABLE) ×4 IMPLANT
TOWER CARTRIDGE SMART MIX (DISPOSABLE) ×2 IMPLANT
TRAY FOLEY CATH 14FRSI W/METER (CATHETERS) ×2 IMPLANT
WATER STERILE IRR 1500ML POUR (IV SOLUTION) ×4 IMPLANT
WRAP KNEE MAXI GEL POST OP (GAUZE/BANDAGES/DRESSINGS) ×2 IMPLANT

## 2013-11-18 NOTE — Preoperative (Signed)
Beta Blockers   Reason not to administer Beta Blockers:Not Applicable 

## 2013-11-18 NOTE — Anesthesia Procedure Notes (Signed)
Procedure Name: Intubation Date/Time: 11/18/2013 1:05 PM Performed by: Danley Danker L Patient Re-evaluated:Patient Re-evaluated prior to inductionOxygen Delivery Method: Circle system utilized Preoxygenation: Pre-oxygenation with 100% oxygen Intubation Type: IV induction Ventilation: Mask ventilation without difficulty Laryngoscope Size: Miller and 2 Grade View: Grade II Tube size: 7.5 mm Number of attempts: 2 Airway Equipment and Method: Stylet Placement Confirmation: ETT inserted through vocal cords under direct vision,  breath sounds checked- equal and bilateral and positive ETCO2 Secured at: 20 cm Tube secured with: Tape Dental Injury: Teeth and Oropharynx as per pre-operative assessment

## 2013-11-18 NOTE — Transfer of Care (Signed)
Immediate Anesthesia Transfer of Care Note  Patient: [name]  Procedure(s) Performed: Procedure(s) (LRB): RIGHT TOTAL KNEE ARTHROPLASTY (Right)  Patient Location: PACU  Anesthesia Type: General  Level of Consciousness: sedated, patient cooperative and responds to stimulation  Airway & Oxygen Therapy: Patient Spontanous Breathing and Patient connected to face mask oxgen  Post-op Assessment: Report given to PACU RN and Post -op Vital signs reviewed and stable  Post vital signs: Reviewed and stable  Complications: No apparent anesthesia complications

## 2013-11-18 NOTE — Anesthesia Postprocedure Evaluation (Signed)
Anesthesia Post Note  Patient: [name]  Procedure(s) Performed: Procedure(s) (LRB): RIGHT TOTAL KNEE ARTHROPLASTY (Right)  Anesthesia type: General  Patient location: PACU  Post pain: Pain level controlled  Post assessment: Post-op Vital signs reviewed  Last Vitals:  Filed Vitals:   11/18/13 1630  BP: 147/74  Pulse: 76  Temp:   Resp: 16    Post vital signs: Reviewed  Level of consciousness: sedated  Complications: No apparent anesthesia complications

## 2013-11-18 NOTE — Anesthesia Preprocedure Evaluation (Signed)
Anesthesia Evaluation  Patient identified by MRN, date of birth, ID band Patient awake    Reviewed: Allergy & Precautions, H&P , NPO status , Patient's Chart, lab work & pertinent test results, reviewed documented beta blocker date and time   Airway Mallampati: II TM Distance: >3 FB Neck ROM: Full    Dental  (+) Teeth Intact and Dental Advisory Given   Pulmonary sleep apnea and Continuous Positive Airway Pressure Ventilation , former smoker,  breath sounds clear to auscultation  Pulmonary exam normal       Cardiovascular hypertension, Pt. on medications Rhythm:Regular Rate:Normal  Denies cardiac symptoms   Neuro/Psych Anxiety negative neurological ROS  negative psych ROS   GI/Hepatic negative GI ROS, Neg liver ROS,   Endo/Other  negative endocrine ROS  Renal/GU negative Renal ROS   Bladder lesion     Musculoskeletal negative musculoskeletal ROS (+)   Abdominal   Peds  Hematology negative hematology ROS (+)   Anesthesia Other Findings   Reproductive/Obstetrics Hx Breast Cancer                           Anesthesia Physical Anesthesia Plan  ASA: II  Anesthesia Plan: General   Post-op Pain Management:    Induction: Intravenous  Airway Management Planned: Oral ETT  Additional Equipment:   Intra-op Plan:   Post-operative Plan: Extubation in OR  Informed Consent: I have reviewed the patients History and Physical, chart, labs and discussed the procedure including the risks, benefits and alternatives for the proposed anesthesia with the patient or authorized representative who has indicated his/her understanding and acceptance.   Dental advisory given  Plan Discussed with: CRNA  Anesthesia Plan Comments:         Anesthesia Quick Evaluation

## 2013-11-18 NOTE — Progress Notes (Signed)
Spoke with patient in regards to nighttime CPAP, has brought in home nasal pillows--set machine on 5 cm H20 per patient home regimen, filled humidification chamber. RT will assist as needed.

## 2013-11-18 NOTE — Interval H&P Note (Signed)
History and Physical Interval Note:  11/18/2013 12:51 PM  [name]  has presented today for surgery, with the diagnosis of right knee osteoarthritis  The various methods of treatment have been discussed with the patient and family. After consideration of risks, benefits and other options for treatment, the patient has consented to  Procedure(s): RIGHT TOTAL KNEE ARTHROPLASTY (Right) as a surgical intervention .  The patient's history has been reviewed, patient examined, no change in status, stable for surgery.  I have reviewed the patient's chart and labs.  Questions were answered to the patient's satisfaction.     Shakeia Krus A

## 2013-11-18 NOTE — Brief Op Note (Signed)
11/18/2013  2:43 PM  PATIENT:  [name]  63 y.o. 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  PROCEDURE:  Procedure(s): RIGHT TOTAL KNEE ARTHROPLASTY (Right) and Injections of both thumbs, Metacarpal- Phalangeal Joints with Kenalog  SURGEON:  Surgeon(s) and Role:    * Tobi Bastos, MD - Primary  PHYSICIAN ASSISTANT: Ardeen Jourdain PA  ASSISTANTS: Ardeen Jourdain PA  ANESTHESIA:   general  EBL:  Total I/O In: 1000 [I.V.:1000] Out: 300 [Urine:300]  BLOOD ADMINISTERED:none  DRAINS: (one) Hemovact drain(s) in the Right Knee with  Suction Open   LOCAL MEDICATIONS USED:  BUPIVICAINE 20cc mixed with 20cc of Normal Saline   SPECIMEN:  No Specimen  DISPOSITION OF SPECIMEN:  N/A  COUNTS:  YES  TOURNIQUET:  * Missing tourniquet times found for documented tourniquets in log:  [id number] *  DICTATION: .Other Dictation: Dictation Number [id number]  PLAN OF CARE: Admit to inpatient   PATIENT DISPOSITION:  Stable in OR   Delay start of Pharmacological VTE agent (>24hrs) due to surgical blood loss or risk of bleeding: yes

## 2013-11-19 DIAGNOSIS — D62 Acute posthemorrhagic anemia: Secondary | ICD-10-CM | POA: Diagnosis not present

## 2013-11-19 LAB — BASIC METABOLIC PANEL
BUN: 16 mg/dL (ref 6–23)
CALCIUM: 8.6 mg/dL (ref 8.4–10.5)
CHLORIDE: 101 meq/L (ref 96–112)
CO2: 26 mEq/L (ref 19–32)
CREATININE: 0.66 mg/dL (ref 0.50–1.10)
GFR calc Af Amer: >90 mL/min (ref >90)
Glucose, Bld: 158 mg/dL — ABNORMAL HIGH (ref 70–99)
Potassium: 4 mEq/L (ref 3.7–5.3)
Sodium: 137 mEq/L (ref 137–147)

## 2013-11-19 LAB — CBC
HEMATOCRIT: 27.4 % — AB (ref 36.0–46.0)
Hemoglobin: 9.2 g/dL — ABNORMAL LOW (ref 12.0–15.0)
MCH: 29.9 pg (ref 26.0–34.0)
MCHC: 33.6 g/dL (ref 30.0–36.0)
MCV: 89 fL (ref 78.0–100.0)
Platelets: 215 10*3/uL (ref 150–400)
RBC: 3.08 MIL/uL — ABNORMAL LOW (ref 3.87–5.11)
RDW: 12.7 % (ref 11.5–15.5)
WBC: 13 10*3/uL — AB (ref 4.0–10.5)

## 2013-11-19 MED ORDER — METHOCARBAMOL 500 MG PO TABS: 500.0000 mg | ORAL_TABLET | Freq: Four times a day (QID) | ORAL | Status: DC | PRN

## 2013-11-19 MED ORDER — OXYCODONE HCL 5 MG PO TABS: 5.0000 mg | ORAL_TABLET | ORAL | Status: DC | PRN

## 2013-11-19 MED ORDER — OXYCODONE HCL 5 MG PO TABS
5.0000 mg | ORAL_TABLET | ORAL | Status: DC | PRN
  Administered 2013-11-19 – 2013-11-20 (×5): 10 mg via ORAL
  Filled 2013-11-19 (×5): qty 2

## 2013-11-19 MED ORDER — FERROUS SULFATE 325 (65 FE) MG PO TABS: 325.0000 mg | ORAL_TABLET | Freq: Three times a day (TID) | ORAL | Status: DC

## 2013-11-19 MED ORDER — RIVAROXABAN 10 MG PO TABS: 10.0000 mg | ORAL_TABLET | Freq: Every day | ORAL | Status: DC

## 2013-11-19 NOTE — Evaluation (Signed)
Occupational Therapy Evaluation Patient Details Name: JOYCELIN Gibson MRN: [id number] DOB: 03-19-1951 Today's Date: 11/19/2013 Time: 4401-0272 OT Time Calculation (min): 15 min  OT Assessment / Plan / Recommendation History of present illness R TKA   Clinical Impression   Pt was admitted for the above surgery.  All education was completed.  No further OT is needed at this time.      OT Assessment  Patient does not need any further OT services    Follow Up Recommendations  No OT follow up    Barriers to Discharge      Equipment Recommendations  None recommended by OT    Recommendations for Other Services    Frequency       Precautions / Restrictions Precautions Precautions: Knee Required Braces or Orthoses: Knee Immobilizer - Right Restrictions Weight Bearing Restrictions: No Other Position/Activity Restrictions: WBAT   Pertinent Vitals/Pain RLE throbbing after ambulating to bathroom; repositioned and ice reapplied    ADL  Toilet Transfer: Min guard Toilet Transfer Method: Sit to stand Toilet Transfer Equipment: Raised toilet seat with arms (or 3-in-1 over toilet) Toileting - Clothing Manipulation and Hygiene: Min guard Where Assessed - Toileting Clothing Manipulation and Hygiene: Sit to stand from 3-in-1 or toilet Tub/Shower Transfer: Min guard Tub/Shower Transfer Method: Therapist, art: Walk in shower Equipment Used: Rolling walker Transfers/Ambulation Related to ADLs: ambulated to bathroom with min guard, cues for sequence.  Cued pt to sidestep as she lifted walker to negotiate around obstacles ADL Comments: Pt needs min A for LB adls and set up for UB.  A netted sponge on a stick would be helpful.  Husband will assist as needed.      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  Visit Information  Last OT Received On: 11/19/13 Assistance Needed: +1 History of Present Illness: R TKA       Prior [place] expects to be discharged to:: Private residence Living Arrangements: Spouse/significant other Available Help at Discharge: Family;Available 24 hours/day Home Access: Stairs to enter [employer] of Steps: 2 Entrance Stairs-Rails: None Home Layout: One level Home Equipment: Cane - single point;Bedside commode;Crutches;Shower seat - built in Prior Function Level of Independence: Independent with assistive device(s) Comments: used SPC; works at [employer], Programmer, 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         Vision/Perception     Solicitor Arousal/Alertness: Awake/alert Behavior During Therapy: WFL for tasks assessed/performed Overall Cognitive Status: Within Functional Limits for tasks assessed    Extremity/Trunk Assessment Upper Extremity Assessment Upper Extremity Assessment: Overall WFL for tasks assessed (s/p injections to bil CMC joints) Lower Extremity Assessment Lower Extremity Assessment: RLE deficits/detail RLE Deficits / Details: knee flexion AAROM 40*, ext 0*, SLR +2/5, ankle WNL Cervical / Trunk Assessment Cervical / Trunk Assessment: Normal     Mobility  Transfers Overall transfer level: Needs assistance Equipment used: Rolling walker (2 wheeled) Transfers: Sit to/from Stand Sit to Stand: Min guard General transfer comment: steadying assistance and cues for UE/LE placement     Exercise    Balance     End of Session OT - End of Session Activity Tolerance: Patient tolerated treatment well Patient left: in chair;with call bell/phone within reach  GO  [name] 11/19/2013, 11:35 AM Lesle Chris, OTR/L [phone] 11/19/2013

## 2013-11-19 NOTE — Evaluation (Signed)
Physical Therapy Evaluation Patient Details Name: [name] MRN: [id number] DOB: [birth year] Today's Date: 11/19/2013 Time: 3382-5053 PT Time Calculation (min): 28 min  PT Assessment / Plan / Recommendation History of Present Illness  R TKA  Clinical Impression  **Pt is s/p TKA resulting in the deficits listed below (see PT Problem List). * Pt will benefit from skilled PT to increase their independence and safety with mobility to allow discharge to the venue listed below.   Pt ambulated 110' with RW and min/guard assist. Good progress expected. HHPT and RW recommended.   *    PT Assessment  Patient needs continued PT services    Follow Up Recommendations  Home health PT    Does the patient have the potential to tolerate intense rehabilitation      Barriers to Discharge        Equipment Recommendations  Rolling walker with 5" wheels    Recommendations for Other Services     Frequency 7X/week    Precautions / Restrictions Precautions Precautions: Knee Required Braces or Orthoses: Knee Immobilizer - Right Restrictions Weight Bearing Restrictions: No Other Position/Activity Restrictions: WBAT   Pertinent Vitals/Pain **8/10 R knee after exercises Ice applied, pain meds requested *      Mobility  Bed Mobility Overal bed mobility: Needs Assistance Bed Mobility: Supine to Sit Supine to sit: Min assist General bed mobility comments: min A to support RLE Transfers Overall transfer level: Needs assistance Equipment used: Rolling walker (2 wheeled) Transfers: Sit to/from Stand Sit to Stand: Min assist General transfer comment: min A to steady, VCs hand placement Ambulation/Gait Ambulation/Gait assistance: Min guard Ambulation Distance (Feet): 110 Feet Assistive device: Rolling walker (2 wheeled) Gait Pattern/deviations: Step-to pattern General Gait Details: VCs sequencing, no LOB    Exercises Total Joint Exercises Ankle Circles/Pumps: AROM;Both;10  reps;Supine Quad Sets: AROM;Both;10 reps;Supine Short Arc Quad: AAROM;Right;10 reps;Supine Heel Slides: AAROM;Right;10 reps;Supine Straight Leg Raises: AAROM;Right;10 reps;Supine Goniometric ROM: 40* R knee flexion AAROM, 0* ext    PT Diagnosis: Difficulty walking;Acute pain  PT Problem List: Decreased strength;Decreased range of motion;Decreased activity tolerance;Pain;Decreased mobility PT Treatment Interventions: Stair training;Gait training;DME instruction;Therapeutic activities;Therapeutic exercise;Patient/family education     PT Goals(Current goals can be found in the care plan section) Acute Rehab PT Goals 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: With patient Time For Goal Achievement: 11/26/13 Potential to Achieve Goals: Good  Visit Information  Last PT Received On: 11/19/13 Assistance Needed: +1 History of Present Illness: R TKA       Prior [place] expects to be discharged to:: Private residence Living Arrangements: Spouse/significant other Available Help at Discharge: Family;Available 24 hours/day Home Access: Stairs to enter [employer] of Steps: 2 Entrance Stairs-Rails: None Home Layout: One level Home Equipment: Cane - single point;Bedside commode;Crutches Prior Function Level of Independence: Independent with assistive device(s) Comments: used SPC; works at [employer], Programmer, 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    Cognition  Cognition Arousal/Alertness: Awake/alert Behavior During Therapy: WFL for tasks assessed/performed Overall Cognitive Status: Within Functional Limits for tasks assessed    Extremity/Trunk Assessment Upper Extremity Assessment Upper Extremity Assessment: Overall WFL for tasks assessed Lower Extremity Assessment Lower Extremity Assessment: RLE deficits/detail RLE Deficits / Details: knee flexion AAROM 40*, ext 0*, SLR +2/5, ankle  WNL Cervical / Trunk Assessment Cervical / Trunk Assessment: Normal   Balance    End of Session PT - End of Session Equipment Utilized During Treatment: Gait belt;Right knee  immobilizer Activity Tolerance: Patient limited by pain Patient left: in chair;with call bell/phone within reach Nurse Communication: Mobility status  GP     Blondell Reveal Kistler 11/19/2013, 10:30 AM [phone]

## 2013-11-19 NOTE — Op Note (Signed)
NAMEASLIN, FARINAS                 ACCOUNT NO.:  [id number]  MEDICAL RECORD NO.:  [id number]  LOCATION:  20                         FACILITY:  [facility]  PHYSICIAN:  Kipp Brood. Anaysia Germer, M.D.DATE OF BIRTH:  07-23-1951  DATE OF PROCEDURE:  11/18/2013 DATE OF DISCHARGE:                              OPERATIVE REPORT   SURGEON:  Kipp Brood. Gladstone Lighter, M.D.  ASSISTANT:  Ardeen Jourdain, [place]  PREOPERATIVE DIAGNOSES: 1. 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 2. 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  POSTOPERATIVE DIAGNOSES: 1. 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 2. 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  OPERATION: 1. Injection into the base of the right thumb with 1 mL of Kenalog. 2. Injection into the base of the left thumb with 1 mL of Kenalog. 3. Right total knee arthroplasty utilizing the DePuy system, all 3     components were cemented.  Gentamicin was used in the cement.  Size     of the patella was 35 with 3 PEGs.  The size of the femur was size     2.5 right posterior cruciate sacrificing, tibial tray was a size 2.     The insert was a size 2.5, 12.5-mm thickness.  DESCRIPTION OF PROCEDURE:  Under general anesthesia, routine orthopedic prep and draping was carried out of the both thumbs.  Appropriate time- out was first carried out separately before the thumb injected, first injected 1 mL of Kenalog into the base of the right thumb, second after sterile prep and opposite, the left thumb injected 1 mL of Kenalog. Note, both thumbs were injected.  After that, Band-Aids were applied. Next, attention was directed to the right lower extremity.  Sterile prep and draping of the right lower extremity was carried out.  Appropriate time-out was first carried out separately for the knee.  I also marked the appropriate  right leg in the holding area.  I also marked the appropriate right and left thumbs in the holding area.  The leg was exsanguinated with an Esmarch.  Tourniquet was elevated to 300 mmHg. The knee was flexed.  An anterior approach of the knee was carried out, 2 flaps were created.  I reflected the patella laterally.  I then did medial and lateral meniscectomies and did anterior and posterior cruciate resections.  Thoroughly irrigated out the knee, initial drill holes made in the intercondylar notch.  I then removed and inserted the next jig and removed 12 mm thickness off the distal femur.  Following that, we measured the femur to be a size 2.5 right.  We carried out our anterior, posterior, and chamfering cuts for 2.5 right posterior cruciate sacrificing femoral component.  We then measured the tibia. The tibia measured to be a size 2 tray.  We then made initial drill hole in the tibial plateau and then removed 6-mm thickness off the affected medial side of the tibia.  We thoroughly irrigated out the area.  We  then inserted the lamina spreaders and removed the posterior spurs to the medial and lateral femoral condyles.  Once this was done, we then inserted our spacer blocks and had excellent stability and excellent fit with the 12.5 mm thickness in flexion and extension.  We then removed the spacer blocks, and then carried on the procedure and cut our keel cut out of the proximal tibia plateau.  We then did our notch cut on the distal femur.  We inserted our trial components, reduced the knee and had an excellent fit.  We inserted a 12.5-mm thickness size 2.5 insert had excellent stability, and excellent flexion and extension.  With the trials in place, I then did a resurfacing procedure on the patella. Three drill holes were made on the patella for a size 35 patella.  The spurs were removed from the patella first.  After this, all trial components were removed, I thoroughly water picked  out the knee and cemented all 3 components in simultaneously utilizing gentamicin and the cement.  After the cement was hardened, we then removed all loose pieces of cement.  We water picked out the knee as well and then injected half of the mixture of 20 mL of Exparel and normal saline into the area to flush out all the cement.  The knee was now clear.  I then inserted my rotating platform 12.5-mm thickness, size 2.5.  We reduced the knee and had excellent stability.  Note, also packed some thrombin-soaked Gelfoam in the posterior compartments.  The knee then as I said was reduced, we then inserted a Hemovac drain and closed the knee in layers in the usual fashion.  She had 2 g of IV Ancef preop.0          ______________________________ Kipp Brood. Gladstone Lighter, M.D.     RAG/MEDQ  D:  11/18/2013  T:  11/19/2013  Job:  [id number]

## 2013-11-19 NOTE — Progress Notes (Signed)
Subjective: 1 Day Post-Op Procedure(s) (LRB): RIGHT TOTAL KNEE ARTHROPLASTY (Right) Patient reports pain as 2 on 0-10 scale.Small avulsion fracture of Lateral Epicondyle vs. Small bone fragment.Hemovac DCd and she is doing very well today.    Objective: Vital signs in last 24 hours: Temp:  [98 F (36.7 C)-99.3 F (37.4 C)] 98 F (36.7 C) (02/06 0617) Pulse Rate:  [75-103] 94 (02/06 0617) Resp:  [12-16] 16 (02/06 0617) BP: (102-164)/(63-79) 113/69 mmHg (02/06 0617) SpO2:  [95 %-100 %] 99 % (02/06 0617)  Intake/Output from previous day: 02/05 0701 - 02/06 0700 In: 4151.7 [P.O.:930; I.V.:3116.7; IV Piggyback:105] Out: 2060 [Urine:1650; Drains:410] Intake/Output this shift:     Recent Labs  11/19/13 0355  HGB 9.2*    Recent Labs  11/19/13 0355  WBC 13.0*  RBC 3.08*  HCT 27.4*  PLT 215    Recent Labs  11/19/13 0355  NA 137  K 4.0  CL 101  CO2 26  BUN 16  CREATININE 0.66  GLUCOSE 158*  CALCIUM 8.6   No results found for this basename: LABPT, INR,  in the last 72 hours  Neurovascular intact Dorsiflexion/Plantar flexion intact  Assessment/Plan: 1 Day Post-Op Procedure(s) (LRB): RIGHT TOTAL KNEE ARTHROPLASTY (Right) Up with therapy. Plan on DC Saturday or Sunday.  Liam Bossman A 11/19/2013, 7:21 AM

## 2013-11-19 NOTE — Progress Notes (Signed)
Placed patient on CPAP for the night at 5cm with oxygen set at 2lpm. Patients Sp02 level at this time is 98%

## 2013-11-19 NOTE — Discharge Instructions (Addendum)
Walk with your walker. °Weight bearing as tolerated °Home Health Agency will follow you at home for your therapy  °Do not change your dressing unless there is excess drainage °Shower only, no tub bath. °Call if any temperatures greater than 101 or any wound complications: [phone] during the day and ask for Dr. Gioffre's nurse, Tammy Johnson. ° ° °=== °Information on my medicine - XARELTO® (Rivaroxaban) ° °This medication education was reviewed with me or my healthcare representative as part of my discharge preparation.  The pharmacist that spoke with me during my hospital stay was:  Absher, Randall K, RPH ° °Why was Xarelto® prescribed for you? °Xarelto® was prescribed for you to reduce the risk of blood clots forming after orthopedic surgery. The medical term for these abnormal blood clots is venous thromboembolism (VTE). ° °What do you need to know about xarelto® ? °Take your Xarelto® ONCE DAILY at the same time every day. °You may take it either with or without food. ° °If you have difficulty swallowing the tablet whole, you may crush it and mix in applesauce just prior to taking your dose. ° °Take Xarelto® exactly as prescribed by your doctor and DO NOT stop taking Xarelto® without talking to the doctor who prescribed the medication.  Stopping without other VTE prevention medication to take the place of Xarelto® may increase your risk of developing a clot. ° °After discharge, you should have regular check-up appointments with your healthcare provider that is prescribing your Xarelto®.   ° °What do you do if you miss a dose? °If you miss a dose, take it as soon as you remember on the same day then continue your regularly scheduled once daily regimen the next day. Do not take two doses of Xarelto® on the same day.  ° °Important Safety Information °A possible side effect of Xarelto® is bleeding. You should call your healthcare provider right away if you experience any of the following: °  Bleeding from an injury  or your nose that does not stop. °  Unusual colored urine (red or dark brown) or unusual colored stools (red or black). °  Unusual bruising for unknown reasons. °  A serious fall or if you hit your head (even if there is no bleeding). ° °Some medicines may interact with Xarelto® and might increase your risk of bleeding while on Xarelto®. To help avoid this, consult your healthcare provider or pharmacist prior to using any new prescription or non-prescription medications, including herbals, vitamins, non-steroidal anti-inflammatory drugs (NSAIDs) and supplements. ° °This website has more information on Xarelto®: www.xarelto.com. ° ° °

## 2013-11-19 NOTE — Care Management Note (Signed)
  Page 2 of 2   11/19/2013     2:50:05 PM   CARE MANAGEMENT NOTE 11/19/2013  Patient:  [name], [name]   Account Number:  [id number]  Date Initiated:  11/19/2013  Documentation initiated by:  Sherrin Daisy  Subjective/Objective Assessment:   dx rt knee osteoarthritis; total knee replacemnt.    Referral from doctor's office to [facility] to provide [facility] services. HHpt will start day after pt is discharged.     Action/Plan:   CM spoke with patient. Plans are for her to return to her home where spouse & other caregiver will provide care for her. She has commde seat but will need RW. [facility] will provide HHpt services.   Anticipated DC Date:  11/20/2013   Anticipated DC Plan:  [place]  CM consult      [place]   Choice offered to / List presented to:  C-1 Patient      DME agency  [place] arranged  HH-2 PT      [place]   Status of service:  In process, will continue to follow Medicare Important Message given?   (If response is "NO", the following Medicare IM given date fields will be blank) Date Medicare IM given:   Date Additional Medicare IM given:    Discharge Disposition:    Per UR Regulation:    If discussed at Long Length of Stay Meetings, dates discussed:    Comments:  11/19/2013 Sherrin Daisy BSN RN CCM [phone] [facility] rep notified of DME need.

## 2013-11-19 NOTE — Progress Notes (Signed)
Physical Therapy Treatment Patient Details Name: [name] MRN: [id number] DOB: 1951-01-29 Today's Date: 11/19/2013 Time: 1204-1228 PT Time Calculation (min): 24 min  PT Assessment / Plan / Recommendation  History of Present Illness R TKA   PT Comments   *Pt progressing well with mobility, she walked 110' with supervision. Will plan to do stair training tomorrow morning, then expect pt will be ready to DC home. **  Follow Up Recommendations  Home health PT     Does the patient have the potential to tolerate intense rehabilitation     Barriers to Discharge        Equipment Recommendations  Rolling walker with 5" wheels    Recommendations for Other Services    Frequency 7X/week   Progress towards PT Goals Progress towards PT goals: Progressing toward goals  Plan Current plan remains appropriate    Precautions / Restrictions Precautions Precautions: Knee Required Braces or Orthoses: Knee Immobilizer - Right Restrictions Weight Bearing Restrictions: No Other Position/Activity Restrictions: WBAT   Pertinent Vitals/Pain *3/10 R knee with walking Premedicated, ice applied**    Mobility  Bed Mobility Overal bed mobility: Needs Assistance Bed Mobility: Sit to Supine Supine to sit: Min assist Sit to supine: Min assist General bed mobility comments: min A to support RLE, instructed pt to self assist with LLE Transfers Overall transfer level: Needs assistance Equipment used: Rolling walker (2 wheeled) Transfers: Sit to/from Stand Sit to Stand: Supervision General transfer comment:  VCs hand placement Ambulation/Gait Ambulation/Gait assistance: Supervision Ambulation Distance (Feet): 110 Feet Assistive device: Rolling walker (2 wheeled) Gait Pattern/deviations: Step-to pattern General Gait Details: good sequencing, no LOB    Exercises Total Joint Exercises Ankle Circles/Pumps: AROM;Both;10 reps;Supine Quad Sets: AROM;Both;10 reps;Supine Short Arc Quad:  AAROM;Right;10 reps;Supine Heel Slides: AAROM;Right;10 reps;Supine Hip ABduction/ADduction: AAROM;Right;10 reps;Supine Straight Leg Raises: AAROM;Right;10 reps;Supine Goniometric ROM: 40* R knee flexion AAROM, 0* ext    PT Diagnosis: Difficulty walking;Acute pain  PT Problem List: Decreased strength;Decreased range of motion;Decreased activity tolerance;Pain;Decreased mobility PT Treatment Interventions: Stair training;Gait training;DME instruction;Therapeutic activities;Therapeutic exercise;Patient/family education   PT Goals (current goals can now be found in the care plan section) Acute Rehab PT Goals 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: With patient Time For Goal Achievement: 11/26/13 Potential to Achieve Goals: Good  Visit Information  Last PT Received On: 11/19/13 Assistance Needed: +1 History of Present Illness: R TKA    Subjective Data  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

## 2013-11-19 NOTE — Progress Notes (Signed)
Utilization review completed.  

## 2013-11-20 LAB — CBC
HCT: 24.9 % — ABNORMAL LOW (ref 36.0–46.0)
Hemoglobin: 8.2 g/dL — ABNORMAL LOW (ref 12.0–15.0)
MCH: 29.8 pg (ref 26.0–34.0)
MCHC: 32.9 g/dL (ref 30.0–36.0)
MCV: 90.5 fL (ref 78.0–100.0)
PLATELETS: 156 10*3/uL (ref 150–400)
RBC: 2.75 MIL/uL — AB (ref 3.87–5.11)
RDW: 13 % (ref 11.5–15.5)
WBC: 10.4 10*3/uL (ref 4.0–10.5)

## 2013-11-20 LAB — BASIC METABOLIC PANEL
BUN: 13 mg/dL (ref 6–23)
CHLORIDE: 98 meq/L (ref 96–112)
CO2: 31 meq/L (ref 19–32)
Calcium: 8.7 mg/dL (ref 8.4–10.5)
Creatinine, Ser: 0.66 mg/dL (ref 0.50–1.10)
GFR calc Af Amer: >90 mL/min (ref >90)
GFR calc non Af Amer: >90 mL/min (ref >90)
GLUCOSE: 115 mg/dL — AB (ref 70–99)
Potassium: 3.9 mEq/L (ref 3.7–5.3)
SODIUM: 137 meq/L (ref 137–147)

## 2013-11-20 NOTE — Progress Notes (Signed)
[name]  MRN: [id number] DOB/Age: Mar 05, 1951 63 y.o. Physician: Rada Hay Procedure: Procedure(s) (LRB): RIGHT TOTAL KNEE ARTHROPLASTY (Right)     Subjective: Had a temp spike overnight, and painful as expected but otherwise doing ok. Getting ready to work with PT. Denies SOB  Vital Signs Temp:  [98.6 F (37 C)-101.7 F (38.7 C)] 98.6 F (37 C) (02/07 0500) Pulse Rate:  [91-107] 100 (02/07 0500) Resp:  [16-18] 18 (02/07 0500) BP: (112-154)/(60-78) 154/78 mmHg (02/07 0500) SpO2:  [96 %-100 %] 96 % (02/07 0500) Weight:  [66.679 kg (147 lb)] 66.679 kg (147 lb) (02/06 1003)  Lab Results  Recent Labs  11/19/13 0355 11/20/13 0419  WBC 13.0* 10.4  HGB 9.2* 8.2*  HCT 27.4* 24.9*  PLT 215 156   BMET  Recent Labs  11/19/13 0355 11/20/13 0419  NA 137 137  K 4.0 3.9  CL 101 98  CO2 26 31  GLUCOSE 158* 115*  BUN 16 13  CREATININE 0.66 0.66  CALCIUM 8.6 8.7   INR  Date Value Range Status  11/15/2013 0.91  0.00 - 1.49 Final     Exam Right ace wrap removed. No erythema to knee. Aquacel with minimal drainage. Moderate swelling and bruising only [place] Work with PT then probably DC home today Encourage BSIS   [facility] for Dr.Kevin Supple 11/20/2013, 9:09 AM

## 2013-11-20 NOTE — Progress Notes (Signed)
   CARE MANAGEMENT NOTE 11/20/2013  Patient:  [name], [name]   Account Number:  [id number]  Date Initiated:  11/19/2013  Documentation initiated by:  Sherrin Daisy  Subjective/Objective Assessment:   dx rt knee osteoarthritis; total knee replacemnt.    Referral from doctor's office to [facility] to provide [facility] services. HHpt will start day after pt is discharged.     Action/Plan:   CM spoke with patient. Plans are for her to return to her home where spouse & other caregiver will provide care for her. She has commde seat but will need RW. Ar[facility] Go will provide HHpt services.   Anticipated DC Date:  11/20/2013   Anticipated DC Plan:  [place] [facility]  CM consult      [facility] Choice  HOME HEALTH  DURABLE MEDICAL EQUIPMENT   Choice offered to / List presented to:  C-1 Patient   DME arranged  Vassie Moselle      DME agency  [place] arranged  HH-2 PT      [place]   Status of service:  Completed, signed off Medicare Important Message given?   (If response is "NO", the following Medicare IM given date fields will be blank) Date Medicare IM given:   Date Additional Medicare IM given:    Discharge Disposition:  [place]  Per UR Regulation:    If discussed at Long Length of Stay Meetings, dates discussed:    Comments:  11/20/2013 1030 RW delivered to room. [facility] aware of dc home with HH today. Jonnie Finner RN CCM Case Mgmt phone [phone]  11/19/2013 Sherrin Daisy BSN RN CCM [phone] [facility] rep notified of DME need.

## 2013-11-20 NOTE — Progress Notes (Signed)
Physical Therapy Treatment Patient Details Name: MICHIAH MASSE MRN: [id number] DOB: [birth year] Today's Date: 11/20/2013 Time: 5427-0623 PT Time Calculation (min): 42 min  PT Assessment / Plan / Recommendation  History of Present Illness R TKA   PT Comments   POD # 2 am session.  Instructed pt and daughter on KI use.  Instructed and demon use of a belt to assist R LE off bed.  Amb in hallway limited distance as stair performance was main focus.  With daughter practiced 2 steps backward with RW twice.  Was also given a handout.  Returned to recliner to perform TKR TE's following handout followed by ICE.  Pt plans to D/C later today.   Follow Up Recommendations  Home health PT     Does the patient have the potential to tolerate intense rehabilitation     Barriers to Discharge        Equipment Recommendations  Rolling walker with 5" wheels (5'3")    Recommendations for Other Services    Frequency 7X/week   Progress towards PT Goals Progress towards PT goals: Progressing toward goals  Plan      Precautions / Restrictions Precautions Precautions: Knee Required Braces or Orthoses: Knee Immobilizer - Right Knee Immobilizer - Right: Discontinue once straight leg raise with < 10 degree lag Restrictions Weight Bearing Restrictions: No Other Position/Activity Restrictions: WBAT   Pertinent Vitals/Pain C/o 7/10 pain after TE's Pre medicated Tylenol requested ICE applied    Mobility  Bed Mobility Overal bed mobility: Needs Assistance Bed Mobility: Supine to Sit Supine to sit: Min guard;Supervision General bed mobility comments: demonstarted and instructed pt how to use a belt to assist R LE off bed Transfers Overall transfer level: Needs assistance Equipment used: Rolling walker (2 wheeled) Transfers: Sit to/from Stand Sit to Stand: Supervision General transfer comment:  VCs hand placement and increased time. Ambulation/Gait Ambulation/Gait assistance: Supervision Ambulation  Distance (Feet): 75 Feet Assistive device: Rolling walker (2 wheeled) Gait Pattern/deviations: Step-to pattern;Decreased stance time - right Gait velocity: decreased General Gait Details: 50% VC's on proper walker to self distance and proper R LE placement within RW. Stairs: Yes Stairs assistance: Min assist Stair Management: No rails;Backwards;With walker Number of Stairs: 2 General stair comments: with daughter demonstrated and instructed on proper sequencing and tech.  Performed twice and was also given handout.    Exercises   Total Knee Replacement TE's 10 reps B LE ankle pumps 10 reps towel squeezes 10 reps knee presses 10 reps heel slides  10 reps SAQ's 10 reps SLR's 10 reps ABD Followed by ICE    PT Goals (current goals can now be found in the care plan section)    Visit Information  Last PT Received On: 11/20/13 Assistance Needed: +1 History of Present Illness: R TKA    Subjective Data      Cognition       Balance     End of Session PT - End of Session Equipment Utilized During Treatment: Gait belt;Right knee immobilizer Activity Tolerance: Patient limited by fatigue;Patient limited by pain (POD 2) Patient left: with call bell/phone within reach;in bed;with family/visitor present   Rica Koyanagi  PTA [facility]  Acute  Rehab Pager      [phone]

## 2013-11-20 NOTE — Progress Notes (Signed)
Discharged from floor via w/c, family with pt. No changes in assessment. Kimberly Coye  

## 2013-11-21 NOTE — Discharge Summary (Signed)
Physician Discharge Summary   Patient ID: [name] MRN: [id number] DOB/AGE: 16-Nov-1950 63 y.o.  Admit date: 11/18/2013 Discharge date: 11/20/2013  Primary Diagnosis: Osteoarthritis, right knee  Admission Diagnoses:  Past Medical History  Diagnosis Date  . Hypertension   . Normal cardiac stress test 08-10-2004  . Bladder tumor   . Blood transfusion   . Urgency of urination   . Arthritis KNEES AND HANDS  . DDD (degenerative disc disease), lumbar   . DDD (degenerative disc disease)   . OSA on CPAP   . Anxiety    Discharge Diagnoses:   Active Problems:   Osteoarthritis of right knee   Total knee replacement status   Acute blood loss anemia  Estimated body mass index is 25.63 kg/(m^2) as calculated from the following:   Height as of this encounter: 5' 3.5" (1.613 m).   Weight as of this encounter: 66.679 kg (147 lb).  Procedure:  Procedure(s) (LRB): RIGHT TOTAL KNEE ARTHROPLASTY (Right)   Consults: None  HPI: [name], 63 y.o. female, has a history of pain and functional disability in the right knee due to arthritis and has failed non-surgical conservative treatments for greater than 12 weeks to includeNSAID's and/or analgesics, corticosteriod injections and activity modification. Onset of symptoms was gradual, starting 4 years ago with gradually worsening course since that time. The patient noted prior procedures on the knee to include arthroscopy and menisectomy on the right knee(s). Patient currently rates pain in the right knee(s) at 7 out of 10 with activity. Patient has night pain, worsening of pain with activity and weight bearing, pain that interferes with activities of daily living, pain with passive range of motion, crepitus and joint swelling. Patient has evidence of periarticular osteophytes and joint space narrowing by imaging studies. There is no active infection.   Laboratory Data: Admission on 11/18/2013, Discharged on 11/20/2013  Component Date Value  Range Status  . WBC 11/19/2013 13.0* 4.0 - 10.5 K/uL Final  . RBC 11/19/2013 3.08* 3.87 - 5.11 MIL/uL Final  . Hemoglobin 11/19/2013 9.2* 12.0 - 15.0 g/dL Final  . HCT 11/19/2013 27.4* 36.0 - 46.0 % Final  . MCV 11/19/2013 89.0  78.0 - 100.0 fL Final  . MCH 11/19/2013 29.9  26.0 - 34.0 pg Final  . MCHC 11/19/2013 33.6  30.0 - 36.0 g/dL Final  . RDW 11/19/2013 12.7  11.5 - 15.5 % Final  . Platelets 11/19/2013 215  150 - 400 K/uL Final  . Sodium 11/19/2013 137  137 - 147 mEq/L Final  . Potassium 11/19/2013 4.0  3.7 - 5.3 mEq/L Final  . Chloride 11/19/2013 101  96 - 112 mEq/L Final  . CO2 11/19/2013 26  19 - 32 mEq/L Final  . Glucose, Bld 11/19/2013 158* 70 - 99 mg/dL Final  . BUN 11/19/2013 16  6 - 23 mg/dL Final  . Creatinine, Ser 11/19/2013 0.66  0.50 - 1.10 mg/dL Final  . Calcium 11/19/2013 8.6  8.4 - 10.5 mg/dL Final  . GFR calc non Af Amer 11/19/2013 >90  >90 mL/min Final  . GFR calc Af Amer 11/19/2013 >90  >90 mL/min Final   Comment: (NOTE)                          The eGFR has been calculated using the CKD EPI equation.  This calculation has not been validated in all clinical situations.                          eGFR's persistently <90 mL/min signify possible Chronic Kidney                          Disease.  . WBC 11/20/2013 10.4  4.0 - 10.5 K/uL Final  . RBC 11/20/2013 2.75* 3.87 - 5.11 MIL/uL Final  . Hemoglobin 11/20/2013 8.2* 12.0 - 15.0 g/dL Final  . HCT 11/20/2013 24.9* 36.0 - 46.0 % Final  . MCV 11/20/2013 90.5  78.0 - 100.0 fL Final  . MCH 11/20/2013 29.8  26.0 - 34.0 pg Final  . MCHC 11/20/2013 32.9  30.0 - 36.0 g/dL Final  . RDW 11/20/2013 13.0  11.5 - 15.5 % Final  . Platelets 11/20/2013 156  150 - 400 K/uL Final   Comment: DELTA CHECK NOTED                          REPEATED TO VERIFY                          SPECIMEN CHECKED FOR CLOTS  . Sodium 11/20/2013 137  137 - 147 mEq/L Final  . Potassium 11/20/2013 3.9  3.7 - 5.3 mEq/L Final  .  Chloride 11/20/2013 98  96 - 112 mEq/L Final  . CO2 11/20/2013 31  19 - 32 mEq/L Final  . Glucose, Bld 11/20/2013 115* 70 - 99 mg/dL Final  . BUN 11/20/2013 13  6 - 23 mg/dL Final  . Creatinine, Ser 11/20/2013 0.66  0.50 - 1.10 mg/dL Final  . Calcium 11/20/2013 8.7  8.4 - 10.5 mg/dL Final  . GFR calc non Af Amer 11/20/2013 >90  >90 mL/min Final  . GFR calc Af Amer 11/20/2013 >90  >90 mL/min Final   Comment: (NOTE)                          The eGFR has been calculated using the CKD EPI equation.                          This calculation has not been validated in all clinical situations.                          eGFR's persistently <90 mL/min signify possible Chronic Kidney                          Disease.  Hospital Outpatient Visit on 11/15/2013  Component Date Value Range Status  . aPTT 11/15/2013 27  24 - 37 seconds Final  . WBC 11/15/2013 6.1  4.0 - 10.5 K/uL Final  . RBC 11/15/2013 4.10  3.87 - 5.11 MIL/uL Final  . Hemoglobin 11/15/2013 12.4  12.0 - 15.0 g/dL Final  . HCT 11/15/2013 36.6  36.0 - 46.0 % Final  . MCV 11/15/2013 89.3  78.0 - 100.0 fL Final  . MCH 11/15/2013 30.2  26.0 - 34.0 pg Final  . MCHC 11/15/2013 33.9  30.0 - 36.0 g/dL Final  . RDW 11/15/2013 12.8  11.5 - 15.5 % Final  . Platelets 11/15/2013 227  150 - 400 K/uL Final  .  Neutrophils Relative % 11/15/2013 70  43 - 77 % Final  . Neutro Abs 11/15/2013 4.2  1.7 - 7.7 K/uL Final  . Lymphocytes Relative 11/15/2013 18  12 - 46 % Final  . Lymphs Abs 11/15/2013 1.1  0.7 - 4.0 K/uL Final  . Monocytes Relative 11/15/2013 11  3 - 12 % Final  . Monocytes Absolute 11/15/2013 0.7  0.1 - 1.0 K/uL Final  . Eosinophils Relative 11/15/2013 1  0 - 5 % Final  . Eosinophils Absolute 11/15/2013 0.1  0.0 - 0.7 K/uL Final  . Basophils Relative 11/15/2013 0  0 - 1 % Final  . Basophils Absolute 11/15/2013 0.0  0.0 - 0.1 K/uL Final  . Sodium 11/15/2013 141  137 - 147 mEq/L Final  . Potassium 11/15/2013 4.3  3.7 - 5.3 mEq/L Final  .  Chloride 11/15/2013 103  96 - 112 mEq/L Final  . CO2 11/15/2013 27  19 - 32 mEq/L Final  . Glucose, Bld 11/15/2013 74  70 - 99 mg/dL Final  . BUN 11/15/2013 22  6 - 23 mg/dL Final  . Creatinine, Ser 11/15/2013 0.58  0.50 - 1.10 mg/dL Final  . Calcium 11/15/2013 9.8  8.4 - 10.5 mg/dL Final  . Total Protein 11/15/2013 7.1  6.0 - 8.3 g/dL Final  . Albumin 11/15/2013 3.9  3.5 - 5.2 g/dL Final  . AST 11/15/2013 22  0 - 37 U/L Final  . ALT 11/15/2013 35  0 - 35 U/L Final  . Alkaline Phosphatase 11/15/2013 102  39 - 117 U/L Final  . Total Bilirubin 11/15/2013 <0.2* 0.3 - 1.2 mg/dL Final  . GFR calc non Af Amer 11/15/2013 >90  >90 mL/min Final  . GFR calc Af Amer 11/15/2013 >90  >90 mL/min Final   Comment: (NOTE)                          The eGFR has been calculated using the CKD EPI equation.                          This calculation has not been validated in all clinical situations.                          eGFR's persistently <90 mL/min signify possible Chronic Kidney                          Disease.  Marland Kitchen Prothrombin Time 11/15/2013 12.1  11.6 - 15.2 seconds Final  . INR 11/15/2013 0.91  0.00 - 1.49 Final  . Color, Urine 11/15/2013 YELLOW  YELLOW Final  . APPearance 11/15/2013 CLOUDY* CLEAR Final  . Specific Gravity, Urine 11/15/2013 1.012  1.005 - 1.030 Final  . pH 11/15/2013 5.5  5.0 - 8.0 Final  . Glucose, UA 11/15/2013 NEGATIVE  NEGATIVE mg/dL Final  . Hgb urine dipstick 11/15/2013 NEGATIVE  NEGATIVE Final  . Bilirubin Urine 11/15/2013 NEGATIVE  NEGATIVE Final  . Ketones, ur 11/15/2013 NEGATIVE  NEGATIVE mg/dL Final  . Protein, ur 11/15/2013 NEGATIVE  NEGATIVE mg/dL Final  . Urobilinogen, UA 11/15/2013 0.2  0.0 - 1.0 mg/dL Final  . Nitrite 11/15/2013 NEGATIVE  NEGATIVE Final  . Leukocytes, UA 11/15/2013 NEGATIVE  NEGATIVE Final   MICROSCOPIC NOT DONE ON URINES WITH NEGATIVE PROTEIN, BLOOD, LEUKOCYTES, NITRITE, OR GLUCOSE <1000 mg/dL.  . ABO/RH(D) 11/15/2013 A POS   Final  .  Antibody  Screen 11/15/2013 NEG   Final  . Sample Expiration 11/15/2013 11/21/2013   Final  . MRSA, PCR 11/15/2013 NEGATIVE  NEGATIVE Final  . Staphylococcus aureus 11/15/2013 NEGATIVE  NEGATIVE Final   Comment:                                 The Xpert SA Assay (FDA                          approved for NASAL specimens                          in patients over 72 years of age),                          is one component of                          a comprehensive surveillance                          program.  Test performance has                          been validated by [employer] for patients greater                          than or equal to 49 year old.                          It is not intended                          to diagnose infection nor to                          guide or monitor treatment.     X-Rays:Dg Chest 2 View  11/15/2013   CLINICAL DATA:  Preop for bladder tumor resection.  EXAM: CHEST  2 VIEW  COMPARISON:  06/04/2004  FINDINGS: Two views of the chest were obtained. Lungs are clear bilaterally. Heart and mediastinum are within normal limits. Trachea is midline. There has been resection of the lateral left clavicle.  IMPRESSION: No acute cardiopulmonary disease.   Electronically Signed   By: Markus Daft M.D.   On: 11/15/2013 10:01   Dg Knee Right Port  11/18/2013   ADDENDUM REPORT: 11/18/2013 16:16  ADDENDUM: These results were called by telephone at the time of interpretation on 11/18/2013 at 4:16 PM to Dr. Latanya Maudlin , who verbally acknowledged these results.   Electronically Signed   By: Jacqulynn Cadet M.D.   On: 11/18/2013 16:16   11/18/2013   CLINICAL DATA:  Right total knee replacement  EXAM: PORTABLE RIGHT KNEE - 1-2 VIEW  COMPARISON:  Preoperative radiographs 11/18/2011  FINDINGS: Interval surgical changes of total knee arthroplasty. There is an ossific density just superior to the medial femoral epicondyles which may reflect a retained bone  fragment, or less  likely a periprosthetic fracture. Knee joint alignment is within normal limits. There is a small joint effusion. Scattered subcutaneous emphysema is not unexpected. Suprapatellar drain noted incidentally.  IMPRESSION: Small osseous fragment superior to the lateral femoral epicondyle on the frontal view may represent a retained bone fragment, or less likely a periprosthetic fracture.  Surgical changes of total knee arthroplasty.  Electronically Signed: By: Jacqulynn Cadet M.D. On: 11/18/2013 16:02    EKG: Orders placed during the hospital encounter of 11/15/13  . EKG 12-LEAD  . EKG 12-LEAD     Hospital Course: [name] is a 63 y.o. who was admitted to [facility]. They were brought to the operating room on 11/18/2013 and underwent Procedure(s): RIGHT TOTAL KNEE ARTHROPLASTY.  Patient tolerated the procedure well and was later transferred to the recovery room and then to the orthopaedic floor for postoperative care.  They were given PO and IV analgesics for pain control following their surgery.  They were given 24 hours of postoperative antibiotics of  Anti-infectives   Start     Dose/Rate Route Frequency Ordered Stop   11/18/13 1800  ceFAZolin (ANCEF) IVPB 1 g/50 mL premix     1 g 100 mL/hr over 30 Minutes Intravenous Every 6 hours 11/18/13 1642 11/19/13 0019   11/18/13 1419  polymyxin B 500,000 Units, bacitracin 50,000 Units in sodium chloride irrigation 0.9 % 500 mL irrigation  Status:  Discontinued       As needed 11/18/13 1419 11/18/13 1515   11/18/13 1038  ceFAZolin (ANCEF) IVPB 2 g/50 mL premix     2 g 100 mL/hr over 30 Minutes Intravenous On call to O.R. 11/18/13 1038 11/18/13 1311     and started on DVT prophylaxis in the form of Xarelto.   PT and OT were ordered for total joint protocol.  Discharge planning consulted to help with postop disposition and equipment needs.  Patient had a good night on the evening of surgery.  They started to get up OOB with  therapy on day one. Hemovac drain was pulled without difficulty.  Continued to work with therapy into day two. Patient had progressed with therapy and meeting their goals.  Incision was healing well.  Patient was seen in rounds and was ready to go home.   Discharge Medications: Prior to Admission medications   Medication Sig Start Date End Date Taking? Authorizing Provider  acetaminophen (TYLENOL) 500 MG tablet Take 1,000 mg by mouth every 8 (eight) hours as needed for moderate pain.    Yes Historical Provider, MD  ALPRAZolam Duanne Moron) 0.5 MG tablet Take 0.5 mg by mouth daily as needed for anxiety. For anxiety 06/18/11  Yes Historical Provider, MD  Amlodipine-Valsartan-HCTZ (EXFORGE HCT) 10-320-25 MG TABS Take 1 tablet by mouth every morning.   Yes Historical Provider, MD  buPROPion (WELLBUTRIN XL) 300 MG 24 hr tablet Take 300 mg by mouth every morning.    Yes Historical Provider, MD  celecoxib (CELEBREX) 200 MG capsule Take 200 mg by mouth daily.    Yes Historical Provider, MD  estrogens, conjugated, (PREMARIN) 0.45 MG tablet Take 0.45 mg by mouth daily. .   Yes Historical Provider, MD  nystatin-triamcinolone (MYCOLOG II) cream Apply 1 application topically 2 (two) times daily.   Yes Historical Provider, MD  sertraline (ZOLOFT) 25 MG tablet Take 25 mg by mouth at bedtime.   Yes Historical Provider, MD  zolpidem (AMBIEN CR) 12.5 MG CR tablet Take 12.5 mg by mouth at bedtime.    Yes  Historical Provider, MD  ferrous sulfate 325 (65 FE) MG tablet Take 1 tablet (325 mg total) by mouth 3 (three) times daily after meals. 11/19/13   Tarius Stangelo Renelda Loma, PA-C  methocarbamol (ROBAXIN) 500 MG tablet Take 1 tablet (500 mg total) by mouth every 6 (six) hours as needed for muscle spasms. 11/19/13   Khylon Davies Renelda Loma, PA-C  oxyCODONE (OXY IR/ROXICODONE) 5 MG immediate release tablet Take 1-2 tablets (5-10 mg total) by mouth every 3 (three) hours as needed for severe pain. 11/19/13   Maude Gloor Renelda Loma, PA-C    rivaroxaban (XARELTO) 10 MG TABS tablet Take 1 tablet (10 mg total) by mouth daily with breakfast. 11/19/13   Malachy Chamber, PA-C    Diet: Regular diet Activity:WBAT Follow-up:in 2 weeks Disposition - Home Discharged Condition: stable   Discharge Orders   Future Orders Complete By Expires   Call MD / Call 911  As directed    Comments:     If you experience chest pain or shortness of breath, CALL 911 and be transported to the hospital emergency room.  If you develope a fever above 101 F, pus (white drainage) or increased drainage or redness at the wound, or calf pain, call your surgeon's office.   Constipation Prevention  As directed    Comments:     Drink plenty of fluids.  Prune juice may be helpful.  You may use a stool softener, such as Colace (over the counter) 100 mg twice a day.  Use MiraLax (over the counter) for constipation as needed.   Diet - low sodium heart healthy  As directed    Discharge instructions  As directed    Comments:     Walk with your walker. Weight bearing as tolerated [place] will follow you at home for your therapy Do not change your dressing unless there is excess drainage Shower only, no tub bath. Call if any temperatures greater than 101 or any wound complications: [phone] during the day and ask for Dr. Charlestine Night nurse, Brunilda Payor.   Do not put a pillow under the knee. Place it under the heel.  As directed    Driving restrictions  As directed    Comments:     No driving   Increase activity slowly as tolerated  As directed        Medication List    STOP taking these medications       HYDROcodone-acetaminophen 5-325 MG per tablet  Commonly known as:  NORCO/VICODIN      TAKE these medications       acetaminophen 500 MG tablet  Commonly known as:  TYLENOL  Take 1,000 mg by mouth every 8 (eight) hours as needed for moderate pain.     ALPRAZolam 0.5 MG tablet  Commonly known as:  XANAX  Take 0.5 mg by mouth daily as  needed for anxiety. For anxiety     buPROPion 300 MG 24 hr tablet  Commonly known as:  WELLBUTRIN XL  Take 300 mg by mouth every morning.     celecoxib 200 MG capsule  Commonly known as:  CELEBREX  Take 200 mg by mouth daily.     estrogens (conjugated) 0.45 MG tablet  Commonly known as:  PREMARIN  Take 0.45 mg by mouth daily. Marland Kitchen     EXFORGE HCT 10-320-25 MG Tabs  Generic drug:  Amlodipine-Valsartan-HCTZ  Take 1 tablet by mouth every morning.     ferrous sulfate 325 (65 FE) MG tablet  Take  1 tablet (325 mg total) by mouth 3 (three) times daily after meals.     methocarbamol 500 MG tablet  Commonly known as:  ROBAXIN  Take 1 tablet (500 mg total) by mouth every 6 (six) hours as needed for muscle spasms.     nystatin-triamcinolone cream  Commonly known as:  MYCOLOG II  Apply 1 application topically 2 (two) times daily.     oxyCODONE 5 MG immediate release tablet  Commonly known as:  Oxy IR/ROXICODONE  Take 1-2 tablets (5-10 mg total) by mouth every 3 (three) hours as needed for severe pain.     rivaroxaban 10 MG Tabs tablet  Commonly known as:  XARELTO  Take 1 tablet (10 mg total) by mouth daily with breakfast.     sertraline 25 MG tablet  Commonly known as:  ZOLOFT  Take 25 mg by mouth at bedtime.     zolpidem 12.5 MG CR tablet  Commonly known as:  AMBIEN CR  Take 12.5 mg by mouth at bedtime.           Follow-up Information   Follow up with GIOFFRE,RONALD A, MD. Schedule an appointment as soon as possible for a visit in 2 weeks.   Specialty:  Orthopedic Surgery   Contact information:   [address] [place] [address] [phone]       Follow up with [facility]. [facility])    Contact information:   [address] [place] [address] [phone]       Signed: Cecilio Asper Magie Ciampa LAUREN 11/21/2013, 10:15 AM

## 2013-12-10 ENCOUNTER — Other Ambulatory Visit: Payer: Self-pay | Admitting: *Deleted

## 2013-12-10 ENCOUNTER — Ambulatory Visit (HOSPITAL_COMMUNITY)
Admission: RE | Admit: 2013-12-10 | Discharge: 2013-12-10 | Disposition: A | Discharge status: Home / Self Care | Payer: Managed Care, Other (non HMO) | Source: Ambulatory Visit | Attending: Cardiovascular Disease | Admitting: Cardiovascular Disease

## 2013-12-10 DIAGNOSIS — R609 Edema, unspecified: Secondary | ICD-10-CM | POA: Insufficient documentation

## 2013-12-10 DIAGNOSIS — R600 Localized edema: Secondary | ICD-10-CM

## 2013-12-10 DIAGNOSIS — M7989 Other specified soft tissue disorders: Secondary | ICD-10-CM

## 2013-12-10 DIAGNOSIS — M79609 Pain in unspecified limb: Secondary | ICD-10-CM

## 2013-12-10 NOTE — Progress Notes (Signed)
Right Lower Ext. Venous completed. Negative for DVT. Oda Cogan, BS, RDMS, RVT

## 2014-04-12 ENCOUNTER — Other Ambulatory Visit: Payer: Self-pay | Admitting: Internal Medicine

## 2014-04-12 DIAGNOSIS — R29898 Other symptoms and signs involving the musculoskeletal system: Secondary | ICD-10-CM

## 2014-04-13 ENCOUNTER — Ambulatory Visit
Admission: RE | Admit: 2014-04-13 | Discharge: 2014-04-13 | Disposition: A | Discharge status: Home / Self Care | Payer: 59 | Source: Ambulatory Visit | Attending: Internal Medicine | Admitting: Internal Medicine

## 2014-04-13 DIAGNOSIS — R29898 Other symptoms and signs involving the musculoskeletal system: Secondary | ICD-10-CM

## 2014-06-15 ENCOUNTER — Ambulatory Visit (INDEPENDENT_AMBULATORY_CARE_PROVIDER_SITE_OTHER): Payer: Medicare HMO

## 2014-06-15 VITALS — BP 105/57 | HR 78 | Resp 16 | Ht 63.0 in | Wt 147.0 lb

## 2014-06-15 DIAGNOSIS — M79672 Pain in left foot: Secondary | ICD-10-CM

## 2014-06-15 DIAGNOSIS — M19079 Primary osteoarthritis, unspecified ankle and foot: Secondary | ICD-10-CM

## 2014-06-15 DIAGNOSIS — M79609 Pain in unspecified limb: Secondary | ICD-10-CM

## 2014-06-15 DIAGNOSIS — M19072 Primary osteoarthritis, left ankle and foot: Secondary | ICD-10-CM

## 2014-06-15 DIAGNOSIS — S93325S Dislocation of tarsometatarsal joint of left foot, sequela: Secondary | ICD-10-CM

## 2014-06-15 DIAGNOSIS — M7752 Other enthesopathy of left foot: Secondary | ICD-10-CM

## 2014-06-15 DIAGNOSIS — IMO0002 Reserved for concepts with insufficient information to code with codable children: Secondary | ICD-10-CM

## 2014-06-15 DIAGNOSIS — M7750 Other enthesopathy of unspecified foot: Secondary | ICD-10-CM

## 2014-06-15 NOTE — Patient Instructions (Signed)

## 2014-06-15 NOTE — Progress Notes (Signed)
   Subjective:    Patient ID: [name], female    DOB: 11-27-1950, 63 y.o.   MRN: [id number]  HPI Comments: "The top of my foot hurts"  Patient c/o aching and sometimes sharp sensations dorsal midfoot left since Nov. 2014. She dropped a pan of frozen lasagna on her foot. Initially, bruised and was swollen. That is better, but there is a noticeable and palpable knot. Can't walk barefoot. She did see Dr. Gladstone Lighter. Did xray, said arthritis-already on Celebrex.  Foot Pain Associated symptoms include arthralgias.      Review of Systems  Musculoskeletal: Positive for arthralgias, back pain and gait problem.  All other systems reviewed and are negative.      Objective:   Physical Exam 63 year old white female well-developed well-nourished oriented x3 patient's this time with some pain discomfort right progression left midfoot 1 Lisfranc joint particular medial column second third metatarsal base. There is some dorsal spurring noted clinically and radiographically asymmetric joint space narrowing at Lisfranc joint articulations with subluxation Lisfranc for fifth metatarsal and cuboid. Dermatologically skin color pigment normal hair growth absent nails unremarkable neurologically no open wounds or ulcers no secondary infections. 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 Plan at this time maintain NSAIDs currently taking Celebrex on an as-needed basis warm compress ice pack maintain a stiff soled first solid shank shoe that limits the ability or movement of the mid tarsus joint both rotation and dorsal flexion  plantar flexion at the mid tarsus these is stable no barefoot or flimsy shoes or flip-flops. May be K. for orthoses or an AFO brace in the future otherwise surgical options at some point may be considered if symptoms becomes bad enough to limit function activities arthrodesis of Lisfranc joint would be an option either partial or complete Lisfranc arthrodesis these discussions were had with the patient recommended conservative care in stable shoes and NSAID therapy followup in the future as needed there's any increasing severity or changes in symptoms of  Harriet Masson DPM

## 2015-03-02 ENCOUNTER — Other Ambulatory Visit (HOSPITAL_COMMUNITY): Payer: Self-pay | Admitting: Orthopedic Surgery

## 2015-03-02 DIAGNOSIS — M25562 Pain in left knee: Secondary | ICD-10-CM

## 2015-03-07 ENCOUNTER — Encounter (HOSPITAL_COMMUNITY)
Admission: RE | Admit: 2015-03-07 | Discharge: 2015-03-07 | Disposition: A | Discharge status: Home / Self Care | Payer: Managed Care, Other (non HMO) | Source: Ambulatory Visit | Attending: Orthopedic Surgery | Admitting: Orthopedic Surgery

## 2015-03-07 DIAGNOSIS — M25562 Pain in left knee: Secondary | ICD-10-CM | POA: Insufficient documentation

## 2015-03-07 DIAGNOSIS — Z96652 Presence of left artificial knee joint: Secondary | ICD-10-CM | POA: Diagnosis not present

## 2015-03-07 MED ORDER — TECHNETIUM TC 99M MEDRONATE IV KIT
26.4000 | PACK | Freq: Once | INTRAVENOUS | Status: AC | PRN
  Administered 2015-03-07: 26.4 via INTRAVENOUS

## 2015-05-18 ENCOUNTER — Other Ambulatory Visit: Payer: Self-pay | Admitting: Orthopedic Surgery

## 2015-06-05 ENCOUNTER — Encounter (HOSPITAL_BASED_OUTPATIENT_CLINIC_OR_DEPARTMENT_OTHER): Payer: Self-pay

## 2015-06-05 NOTE — Op Note (Signed)
Spoke with receptionist at Dr. Pennie Banter office. To fax over patients last EKG, labs, and last office note. Sherron Monday, RN BSN [facility]

## 2015-06-08 ENCOUNTER — Ambulatory Visit (HOSPITAL_BASED_OUTPATIENT_CLINIC_OR_DEPARTMENT_OTHER)
Admission: RE | Admit: 2015-06-08 | Discharge: 2015-06-08 | Disposition: A | Discharge status: Home / Self Care | Payer: Managed Care, Other (non HMO) | Source: Ambulatory Visit | Attending: Orthopedic Surgery | Admitting: Orthopedic Surgery

## 2015-06-08 ENCOUNTER — Ambulatory Visit (HOSPITAL_BASED_OUTPATIENT_CLINIC_OR_DEPARTMENT_OTHER): Payer: Managed Care, Other (non HMO) | Admitting: Anesthesiology

## 2015-06-08 ENCOUNTER — Encounter (HOSPITAL_BASED_OUTPATIENT_CLINIC_OR_DEPARTMENT_OTHER): Payer: Self-pay | Admitting: Orthopedic Surgery

## 2015-06-08 ENCOUNTER — Encounter (HOSPITAL_BASED_OUTPATIENT_CLINIC_OR_DEPARTMENT_OTHER)
Admission: RE | Disposition: A | Discharge status: Home / Self Care | Payer: Self-pay | Source: Ambulatory Visit | Attending: Orthopedic Surgery

## 2015-06-08 DIAGNOSIS — I10 Essential (primary) hypertension: Secondary | ICD-10-CM | POA: Insufficient documentation

## 2015-06-08 DIAGNOSIS — G4733 Obstructive sleep apnea (adult) (pediatric): Secondary | ICD-10-CM | POA: Diagnosis not present

## 2015-06-08 DIAGNOSIS — M25841 Other specified joint disorders, right hand: Secondary | ICD-10-CM | POA: Insufficient documentation

## 2015-06-08 DIAGNOSIS — Z87891 Personal history of nicotine dependence: Secondary | ICD-10-CM | POA: Diagnosis not present

## 2015-06-08 DIAGNOSIS — F419 Anxiety disorder, unspecified: Secondary | ICD-10-CM | POA: Insufficient documentation

## 2015-06-08 DIAGNOSIS — Z79899 Other long term (current) drug therapy: Secondary | ICD-10-CM | POA: Diagnosis not present

## 2015-06-08 DIAGNOSIS — M151 Heberden's nodes (with arthropathy): Secondary | ICD-10-CM | POA: Diagnosis not present

## 2015-06-08 DIAGNOSIS — M5136 Other intervertebral disc degeneration, lumbar region: Secondary | ICD-10-CM | POA: Diagnosis not present

## 2015-06-08 HISTORY — PX: MASS EXCISION: SHX2000

## 2015-06-08 HISTORY — PX: DISTAL INTERPHALANGEAL JOINT FUSION: SHX6428

## 2015-06-08 SURGERY — EXCISION MASS
Anesthesia: Monitor Anesthesia Care | Site: Finger | Laterality: Right

## 2015-06-08 MED ORDER — FENTANYL CITRATE (PF) 100 MCG/2ML IJ SOLN
50.0000 ug | INTRAMUSCULAR | Status: DC | PRN
  Administered 2015-06-08: 100 ug via INTRAVENOUS

## 2015-06-08 MED ORDER — HYDROMORPHONE HCL 1 MG/ML IJ SOLN: 0.2500 mg | INTRAMUSCULAR | Status: DC | PRN

## 2015-06-08 MED ORDER — PROPOFOL INFUSION 10 MG/ML OPTIME
INTRAVENOUS | Status: DC | PRN
  Administered 2015-06-08: 100 ug/kg/min via INTRAVENOUS

## 2015-06-08 MED ORDER — FENTANYL CITRATE (PF) 100 MCG/2ML IJ SOLN
INTRAMUSCULAR | Status: AC
  Filled 2015-06-08: qty 4

## 2015-06-08 MED ORDER — PROPOFOL 10 MG/ML IV BOLUS
INTRAVENOUS | Status: DC | PRN
  Administered 2015-06-08: 30 mg via INTRAVENOUS

## 2015-06-08 MED ORDER — CEFAZOLIN SODIUM-DEXTROSE 2-3 GM-% IV SOLR: 2.0000 g | INTRAVENOUS | Status: DC

## 2015-06-08 MED ORDER — OXYCODONE HCL 5 MG PO TABS: 5.0000 mg | ORAL_TABLET | Freq: Once | ORAL | Status: DC | PRN

## 2015-06-08 MED ORDER — OXYCODONE HCL 5 MG/5ML PO SOLN: 5.0000 mg | Freq: Once | ORAL | Status: DC | PRN

## 2015-06-08 MED ORDER — CHLORHEXIDINE GLUCONATE 4 % EX LIQD: 60.0000 mL | Freq: Once | CUTANEOUS | Status: DC

## 2015-06-08 MED ORDER — ONDANSETRON HCL 4 MG/2ML IJ SOLN
INTRAMUSCULAR | Status: DC | PRN
  Administered 2015-06-08: 4 mg via INTRAVENOUS

## 2015-06-08 MED ORDER — LIDOCAINE HCL (PF) 0.5 % IJ SOLN
INTRAMUSCULAR | Status: DC | PRN
  Administered 2015-06-08: 30 mL via INTRAVENOUS

## 2015-06-08 MED ORDER — MIDAZOLAM HCL 2 MG/2ML IJ SOLN
1.0000 mg | INTRAMUSCULAR | Status: DC | PRN
  Administered 2015-06-08: 2 mg via INTRAVENOUS

## 2015-06-08 MED ORDER — PROMETHAZINE HCL 25 MG/ML IJ SOLN: 6.2500 mg | INTRAMUSCULAR | Status: DC | PRN

## 2015-06-08 MED ORDER — BUPIVACAINE HCL (PF) 0.25 % IJ SOLN
INTRAMUSCULAR | Status: DC | PRN
  Administered 2015-06-08: 8 mL

## 2015-06-08 MED ORDER — MIDAZOLAM HCL 2 MG/2ML IJ SOLN
INTRAMUSCULAR | Status: AC
  Filled 2015-06-08: qty 2

## 2015-06-08 MED ORDER — CEFAZOLIN SODIUM-DEXTROSE 2-3 GM-% IV SOLR
INTRAVENOUS | Status: AC
  Filled 2015-06-08: qty 50

## 2015-06-08 MED ORDER — LACTATED RINGERS IV SOLN: 500.0000 mL | INTRAVENOUS | Status: DC

## 2015-06-08 MED ORDER — SCOPOLAMINE 1 MG/3DAYS TD PT72
1.0000 | MEDICATED_PATCH | Freq: Once | TRANSDERMAL | Status: DC | PRN
Start: 2015-06-08 — End: 2015-06-08

## 2015-06-08 MED ORDER — GLYCOPYRROLATE 0.2 MG/ML IJ SOLN: 0.2000 mg | Freq: Once | INTRAMUSCULAR | Status: DC | PRN

## 2015-06-08 MED ORDER — LACTATED RINGERS IV SOLN
INTRAVENOUS | Status: DC
  Administered 2015-06-08 (×2): via INTRAVENOUS

## 2015-06-08 MED ORDER — HYDROCODONE-ACETAMINOPHEN 5-325 MG PO TABS: 1.0000 | ORAL_TABLET | Freq: Four times a day (QID) | ORAL | Status: DC | PRN

## 2015-06-08 SURGICAL SUPPLY — 50 items
BANDAGE COBAN STERILE 2 (GAUZE/BANDAGES/DRESSINGS) IMPLANT
BLADE MINI RND TIP GREEN BEAV (BLADE) IMPLANT
BLADE SURG 15 STRL LF DISP TIS (BLADE) ×1 IMPLANT
BLADE SURG 15 STRL SS (BLADE) ×1
BNDG COHESIVE 1X5 TAN STRL LF (GAUZE/BANDAGES/DRESSINGS) ×2 IMPLANT
BNDG COHESIVE 3X5 TAN STRL LF (GAUZE/BANDAGES/DRESSINGS) IMPLANT
BNDG ESMARK 4X9 LF (GAUZE/BANDAGES/DRESSINGS) ×2 IMPLANT
BNDG GAUZE ELAST 4 BULKY (GAUZE/BANDAGES/DRESSINGS) IMPLANT
BUR FAST CUTTING MED (BURR) IMPLANT
CHLORAPREP W/TINT 26ML (MISCELLANEOUS) ×2 IMPLANT
CORDS BIPOLAR (ELECTRODE) ×2 IMPLANT
COVER BACK TABLE 60X90IN (DRAPES) ×2 IMPLANT
COVER MAYO STAND STRL (DRAPES) ×2 IMPLANT
CUFF TOURNIQUET SINGLE 18IN (TOURNIQUET CUFF) ×2 IMPLANT
DECANTER SPIKE VIAL GLASS SM (MISCELLANEOUS) IMPLANT
DRAIN PENROSE 1/2X12 LTX STRL (WOUND CARE) IMPLANT
DRAPE EXTREMITY T 121X128X90 (DRAPE) ×2 IMPLANT
DRAPE OEC MINIVIEW 54X84 (DRAPES) IMPLANT
DRAPE SURG 17X23 STRL (DRAPES) ×2 IMPLANT
GAUZE SPONGE 4X4 12PLY STRL (GAUZE/BANDAGES/DRESSINGS) ×2 IMPLANT
GAUZE XEROFORM 1X8 LF (GAUZE/BANDAGES/DRESSINGS) ×2 IMPLANT
GLOVE BIOGEL PI IND STRL 8.5 (GLOVE) ×1 IMPLANT
GLOVE BIOGEL PI INDICATOR 8.5 (GLOVE) ×1
GLOVE SURG ORTHO 8.0 STRL STRW (GLOVE) ×2 IMPLANT
GOWN STRL REUS W/ TWL LRG LVL3 (GOWN DISPOSABLE) ×1 IMPLANT
GOWN STRL REUS W/TWL LRG LVL3 (GOWN DISPOSABLE) ×1
GOWN STRL REUS W/TWL XL LVL3 (GOWN DISPOSABLE) ×2 IMPLANT
NDL SAFETY ECLIPSE 18X1.5 (NEEDLE) IMPLANT
NEEDLE HYPO 18GX1.5 SHARP (NEEDLE)
NEEDLE PRECISIONGLIDE 27X1.5 (NEEDLE) ×2 IMPLANT
NS IRRIG 1000ML POUR BTL (IV SOLUTION) ×2 IMPLANT
PACK BASIN DAY SURGERY FS (CUSTOM PROCEDURE TRAY) ×2 IMPLANT
PAD CAST 3X4 CTTN HI CHSV (CAST SUPPLIES) IMPLANT
PADDING CAST ABS 3INX4YD NS (CAST SUPPLIES)
PADDING CAST ABS 4INX4YD NS (CAST SUPPLIES)
PADDING CAST ABS COTTON 3X4 (CAST SUPPLIES) IMPLANT
PADDING CAST ABS COTTON 4X4 ST (CAST SUPPLIES) IMPLANT
PADDING CAST COTTON 3X4 STRL (CAST SUPPLIES)
SLEEVE SCD COMPRESS KNEE MED (MISCELLANEOUS) IMPLANT
SPLINT FINGER 3.25 BULB 911905 (SOFTGOODS) ×2 IMPLANT
SPLINT PLASTER CAST XFAST 3X15 (CAST SUPPLIES) IMPLANT
SPLINT PLASTER XTRA FASTSET 3X (CAST SUPPLIES)
STOCKINETTE 4X48 STRL (DRAPES) ×2 IMPLANT
SUT ETHILON 4 0 PS 2 18 (SUTURE) ×2 IMPLANT
SUT VIC AB 4-0 P2 18 (SUTURE) IMPLANT
SUT VICRYL 4-0 PS2 18IN ABS (SUTURE) IMPLANT
SYR BULB 3OZ (MISCELLANEOUS) ×2 IMPLANT
SYR CONTROL 10ML LL (SYRINGE) ×2 IMPLANT
TOWEL OR 17X24 6PK STRL BLUE (TOWEL DISPOSABLE) ×2 IMPLANT
UNDERPAD 30X30 (UNDERPADS AND DIAPERS) ×2 IMPLANT

## 2015-06-08 NOTE — Op Note (Signed)
Dictation Number [id number]

## 2015-06-08 NOTE — H&P (Signed)
[name] is a 64 year-old right-hand dominant female referred by Dr. Nena Polio for consultation with respect to a mass on her right middle finger.  This began in June.  He did a shave biopsy obtaining a fluid, diagnosing this as a cyst.  He has referred her.  She has had some redness. She has history of arthritis, no history of diabetes or gout.  She complains of a moderate, stabbing type pain, occasional sharp in nature especially if she hits the area.  She has no history of injury.    ALLERGIES:   None.  MEDICATIONS:     Celebrex, Exforge, sertraline, Premarin, turmeric, multivitamins.  SURGICAL HISTORY:   Knee surgery.  FAMILY MEDICAL HISTORY:    Positive for diabetes, high blood pressure and arthritis.    SOCIAL HISTORY:     She does not smoke, drinks socially.  She is an English as a second language teacher for [employer].  REVIEW OF SYSTEMS:    Positive for glasses, high blood pressure, sleep disorder, otherwise negative 14 points. [name] is an 64 y.o. female.   Chief Complaint: mass right middle finger HPI: see above  Past Medical History  Diagnosis Date  . Hypertension   . Normal cardiac stress test 08-10-2004  . Bladder tumor   . Blood transfusion   . Urgency of urination   . Arthritis KNEES AND HANDS  . DDD (degenerative disc disease), lumbar   . DDD (degenerative disc disease)   . OSA on CPAP   . Anxiety     Past Surgical History  Procedure Laterality Date  . Knee arthroscopy  04-02-2010    RIGHT  . Ganglion cyst excision      LEFT WRIST  . Left shoulder arthroscopy/ debridement labrum/ sad/ open distal clavicle resection  03-25-2011  . Rotator cuff repair  12-23-2008    RIGHT-- W/ TISSUEMEND GRAFT  . Total knee arthroplasty  09-16-2007    LEFT  . Knee arthroscopy w/ meniscectomy  02-20-2006    LEFT  . Cesarean section      X2  . Anterior cruciate ligament repair      LEFT  . Dilation and curettage of uterus      FOR MISSED AB  . Tubal ligation     . Breast surgery  07/10/11    left breast excisional biopsy-- benign  . Abdominal hysterectomy  1994    W/ LSO  . Tonsillectomy  CHILD  . Transurethral resection of bladder tumor  12/27/2011    Procedure: TRANSURETHRAL RESECTION OF BLADDER TUMOR (TURBT);  Surgeon: Claybon Jabs, MD;  Location: [facility];  Service: Urology;  Laterality: N/A;  mytomicin c  gyrus   . Total knee arthroplasty Right 11/18/2013    Procedure: RIGHT TOTAL KNEE ARTHROPLASTY;  Surgeon: Tobi Bastos, MD;  Location: [facility];  Service: Orthopedics;  Laterality: Right;    Family History  Problem Relation Age of Onset  . Cancer Mother     colon  . Cancer Father     lung   Social History:  reports that she quit smoking about 34 years ago. Her smoking use included Cigarettes. She has a 12.5 pack-year smoking history. She has never used smokeless tobacco. She reports that she drinks about 0.6 oz of alcohol per week. She reports that she does not use illicit drugs.  Allergies: No Known Allergies  No prescriptions prior to admission    No results found for this or any previous visit (from the  past 48 hour(s)).  No results found.   Pertinent items are noted in HPI.  Height 5\' 3"  (1.6 m), weight 66.679 kg (147 lb).  General appearance: alert, cooperative and appears stated age Head: Normocephalic, without obvious abnormality Neck: no JVD Resp: clear to auscultation bilaterally Cardio: regular rate and rhythm, S1, S2 normal, no murmur, click, rub or gallop GI: soft, non-tender; bowel sounds normal; no masses,  no organomegaly Extremities: mass right middle finger Pulses: 2+ and symmetric Skin: Skin color, texture, turgor normal. No rashes or lesions Neurologic: Grossly normal Incision/Wound: na  Assessment/Plan RADIOGRAPHS:      X-rays reveal degenerative changes DIP joints of all fingers.  DIAGNOSIS:     Mucoid tumor with degenerative arthritis, distal interphalangeal joint right  middle finger.  PLAN: SHE is scheduled for excision of the cyst with debridement of the distal interphalangeal joint right middle finger.  The pre, peri and postoperative course were discussed along with the risks and complications.  The patient is aware there is no guarantee with the surgery, possibility of infection, recurrence, injury to arteries, nerves, tendons, incomplete relief of symptoms and dystrophy  Jupiter Boys R 06/08/2015, 9:43 AM

## 2015-06-08 NOTE — Discharge Instructions (Addendum)

## 2015-06-08 NOTE — Anesthesia Preprocedure Evaluation (Addendum)
Anesthesia Evaluation  Patient identified by MRN, date of birth, ID band Patient awake    Reviewed: Allergy & Precautions, NPO status , Patient's Chart, lab work & pertinent test results  Airway Mallampati: II  TM Distance: >3 FB Neck ROM: Full    Dental   Pulmonary sleep apnea and Continuous Positive Airway Pressure Ventilation , former smoker,  breath sounds clear to auscultation        Cardiovascular hypertension, Pt. on medications Rhythm:Regular Rate:Normal     Neuro/Psych Anxiety negative neurological ROS     GI/Hepatic negative GI ROS, Neg liver ROS,   Endo/Other  negative endocrine ROS  Renal/GU negative Renal ROS     Musculoskeletal   Abdominal   Peds  Hematology  (+) anemia ,   Anesthesia Other Findings   Reproductive/Obstetrics                            Anesthesia Physical Anesthesia Plan  ASA: II  Anesthesia Plan: MAC and Bier Block   Post-op Pain Management:    Induction: Intravenous  Airway Management Planned: Natural Airway and Simple Face Mask  Additional Equipment:   Intra-op Plan:   Post-operative Plan:   Informed Consent: I have reviewed the patients History and Physical, chart, labs and discussed the procedure including the risks, benefits and alternatives for the proposed anesthesia with the patient or authorized representative who has indicated his/her understanding and acceptance.     Plan Discussed with: CRNA  Anesthesia Plan Comments:        Anesthesia Quick Evaluation

## 2015-06-08 NOTE — Brief Op Note (Signed)
06/08/2015  1:52 PM  PATIENT:  [name]  64 y.o. 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  PROCEDURE:  Procedure(s): EXCISION OF MUCOID CYST RIGHT MIDDLE FINGER (Right) DISTAL INTERPHALANGEAL JOINT ARTHROTOMY RIGHT MIDDLE FINGER (Right)  SURGEON:  Surgeon(s) and Role:    * Daryll Brod, MD - Primary  PHYSICIAN ASSISTANT:   ASSISTANTS: none   ANESTHESIA:   local and regional  EBL:  Total I/O In: 1000 [I.V.:1000] Out: -   BLOOD ADMINISTERED:none  DRAINS: none   LOCAL MEDICATIONS USED:  BUPIVICAINE   SPECIMEN:  Excision  DISPOSITION OF SPECIMEN:  PATHOLOGY  COUNTS:  YES  TOURNIQUET:   Total Tourniquet Time Documented: Upper Arm (Right) - 23 minutes Total: Upper Arm (Right) - 23 minutes   DICTATION: .Other Dictation: Dictation Number [id number]  PLAN OF CARE: Discharge to home after PACU  PATIENT DISPOSITION:  PACU - hemodynamically stable.

## 2015-06-08 NOTE — Anesthesia Postprocedure Evaluation (Signed)
  Anesthesia Post-op Note  Patient: [name]  Procedure(s) Performed: Procedure(s): EXCISION OF MUCOID CYST RIGHT MIDDLE FINGER (Right) DISTAL INTERPHALANGEAL JOINT ARTHROTOMY RIGHT MIDDLE FINGER (Right)  Patient Location: PACU  Anesthesia Type:MAC and Bier block  Level of Consciousness: awake, alert  and oriented  Airway and Oxygen Therapy: Patient Spontanous Breathing  Post-op Pain: none  Post-op Assessment: Post-op Vital signs reviewed              Post-op Vital Signs: Reviewed  Last Vitals:  Filed Vitals:   06/08/15 1430  BP:   Pulse: 76  Temp:   Resp: 18    Complications: No apparent anesthesia complications

## 2015-06-08 NOTE — Transfer of Care (Signed)
Immediate Anesthesia Transfer of Care Note  Patient: [name]  Procedure(s) Performed: Procedure(s): EXCISION OF MUCOID CYST RIGHT MIDDLE FINGER (Right) DISTAL INTERPHALANGEAL JOINT ARTHROTOMY RIGHT MIDDLE FINGER (Right)  Patient Location: PACU  Anesthesia Type:MAC and Bier block  Level of Consciousness: awake, alert  and oriented  Airway & Oxygen Therapy: Patient Spontanous Breathing and Patient connected to face mask oxygen  Post-op Assessment: Report given to RN and Post -op Vital signs reviewed and stable  Post vital signs: Reviewed and stable  Last Vitals:  Filed Vitals:   06/08/15 1014  BP: 124/59  Pulse: 81  Temp: 36.7 C  Resp: 16    Complications: No apparent anesthesia complications

## 2015-06-09 ENCOUNTER — Encounter (HOSPITAL_BASED_OUTPATIENT_CLINIC_OR_DEPARTMENT_OTHER): Payer: Self-pay | Admitting: Orthopedic Surgery

## 2015-06-09 NOTE — Op Note (Signed)
NAME:  [name], [name]                      ACCOUNT NO.:  MEDICAL RECORD NO.:  [id number]  LOCATION:                                 FACILITY:  PHYSICIAN:  Daryll Brod, M.D.       DATE OF BIRTH:  02-15-1951  DATE OF PROCEDURE:  06/08/2015 DATE OF DISCHARGE:                              OPERATIVE REPORT   PREOPERATIVE DIAGNOSIS:  Mucoid cyst with degenerative arthritis, distal interphalangeal joint, right middle finger.  POSTOPERATIVE DIAGNOSIS:  Mucoid cyst with degenerative arthritis, distal interphalangeal joint, right middle finger.  OPERATION:  Excision of mucoid cyst with debridement of distal interphalangeal joint, right middle finger.  SURGEON:  Daryll Brod, M.D.  ANESTHESIA:  Forearm-based IV regional with metacarpal block.  ANESTHESIOLOGIST:  Soledad Gerlach, MD  HISTORY:  The patient is a 64 year old female with history of a mass on the dorsal aspect of her right middle finger.  She has had this biopsied, it has returned.  She is admitted now for excision of a mucoid cyst with debridement of distal interphalangeal joint, right middle finger.  Pre, peri, and postoperative course have been discussed along with risks and complications.  She is aware that there is no guarantee with the surgery; possibility of infection; recurrence of injury to arteries, nerves, tendons; incomplete relief of symptoms and dystrophy. In the preoperative area, the patient is seen, the extremity marked by both patient and surgeon, and antibiotic given.  DESCRIPTION OF PROCEDURE:  The patient was brought to the operating room where a forearm-based IV regional anesthetic was carried out without difficulty.  She was prepped using ChloraPrep, supine position with the right arm free.  A 3-minute dry time was allowed.  Time-out taken, confirming the patient and procedure.  A metacarpal block was given, 0.25% bupivacaine without epinephrine, 8 mL was used.  A curvilinear incision was made over  the mass, distal interphalangeal joint and ulnar aspect of the right middle finger carried down through the subcutaneous tissue.  Bleeders were electrocauterized with bipolar.  The cyst was immediately encountered.  This was excised and sent to Pathology.  The joint was opened, the osteophyte was present on the ulnar aspect where the cyst was present, this was removed with a hemostatic-type rongeur. The joint was opened.  Partial synovectomy dorsally was performed along with debridement of the joint.  No further lesions were noted.  The specimen was sent to Pathology.  The wound was copiously irrigated with saline and the skin was closed with interrupted 4-0 nylon sutures.  Sterile compressive dressings, splint to the finger, distal interphalangeal joint was applied.  The patient tolerated the procedure well, was taken to the recovery room for observation in satisfactory condition.  She will be discharged to home to return to the [place] in 1 week, on [facility].          ______________________________ Daryll Brod, M.D.     GK/MEDQ  D:  06/08/2015  T:  06/09/2015  Job:  [id number]

## 2015-06-15 HISTORY — PX: OTHER SURGICAL HISTORY: SHX169

## 2015-06-22 ENCOUNTER — Encounter (HOSPITAL_BASED_OUTPATIENT_CLINIC_OR_DEPARTMENT_OTHER): Payer: Self-pay | Admitting: Orthopedic Surgery

## 2015-08-21 ENCOUNTER — Encounter (HOSPITAL_COMMUNITY): Admission: RE | Payer: Self-pay | Source: Ambulatory Visit

## 2015-08-21 ENCOUNTER — Inpatient Hospital Stay (HOSPITAL_COMMUNITY)
Admission: RE | Admit: 2015-08-21 | Payer: Managed Care, Other (non HMO) | Source: Ambulatory Visit | Admitting: Orthopedic Surgery

## 2015-08-21 SURGERY — TOTAL KNEE REVISION
Anesthesia: Spinal | Site: Knee | Laterality: Left

## 2015-10-11 ENCOUNTER — Other Ambulatory Visit: Payer: Self-pay | Admitting: Internal Medicine

## 2015-10-11 DIAGNOSIS — R748 Abnormal levels of other serum enzymes: Secondary | ICD-10-CM

## 2015-10-19 ENCOUNTER — Ambulatory Visit
Admission: RE | Admit: 2015-10-19 | Discharge: 2015-10-19 | Disposition: A | Discharge status: Home / Self Care | Payer: Medicare Other | Source: Ambulatory Visit | Attending: Internal Medicine | Admitting: Internal Medicine

## 2015-10-19 DIAGNOSIS — R748 Abnormal levels of other serum enzymes: Secondary | ICD-10-CM

## 2015-11-15 ENCOUNTER — Other Ambulatory Visit (HOSPITAL_COMMUNITY): Payer: Self-pay | Admitting: *Deleted

## 2015-11-15 NOTE — Patient Instructions (Addendum)
[name]  11/15/2015   Your procedure is scheduled on: 11-27-15  Report to [facility] Main  Entrance take [facility]  elevators to 3rd floor to  [place] at 530 AM.  Call this number if you have problems the morning of surgery [phone]   Remember: ONLY 1 PERSON MAY GO WITH YOU TO SHORT STAY TO GET  READY MORNING OF [place].  Do not eat food or drink liquids :After Midnight.  BRING CPAP MASK AND TUBING   Take these medicines the morning of surgery with A SIP OF WATER: SERTRALINE (ZOLOFT), XANAX IF NEEDED, HYDROCODONE IF NEEDED                                You may not have any metal on your body including hair pins and              piercings  Do not wear jewelry, make-up, lotions, powders or perfumes, deodorant             Do not wear nail polish.  Do not shave  48 hours prior to surgery.              Men may shave face and neck.   Do not bring valuables to the hospital. [place].  Contacts, dentures or bridgework may not be worn into surgery.  Leave suitcase in the car. After surgery it may be brought to your room.                  Please read over the following fact sheets you were given: _____________________________________________________________________             [facility] - Preparing for Surgery Before surgery, you can play an important role.  Because skin is not sterile, your skin needs to be as free of germs as possible.  You can reduce the number of germs on your skin by washing with CHG (chlorahexidine gluconate) soap before surgery.  CHG is an antiseptic cleaner which kills germs and bonds with the skin to continue killing germs even after washing. Please DO NOT use if you have an allergy to CHG or antibacterial soaps.  If your skin becomes reddened/irritated stop using the CHG and inform your nurse when you arrive at Short Stay. Do not shave (including legs and underarms) for  at least 48 hours prior to the first CHG shower.  You may shave your face/neck. Please follow these instructions carefully:  1.  Shower with CHG Soap the night before surgery and the  morning of Surgery.  2.  If you choose to wash your hair, wash your hair first as usual with your  normal  shampoo.  3.  After you shampoo, rinse your hair and body thoroughly to remove the  shampoo.                           4.  Use CHG as you would any other liquid soap.  You can apply chg directly  to the skin and wash                       Gently with a  scrungie or clean washcloth.  5.  Apply the CHG Soap to your body ONLY FROM THE NECK DOWN.   Do not use on face/ open                           Wound or open sores. Avoid contact with eyes, ears mouth and genitals (private parts).                       Wash face,  Genitals (private parts) with your normal soap.             6.  Wash thoroughly, paying special attention to the area where your surgery  will be performed.  7.  Thoroughly rinse your body with warm water from the neck down.  8.  DO NOT shower/wash with your normal soap after using and rinsing off  the CHG Soap.                9.  Pat yourself dry with a clean towel.            10.  Wear clean pajamas.            11.  Place clean sheets on your bed the night of your first shower and do not  sleep with pets. Day of Surgery : Do not apply any lotions/deodorants the morning of surgery.  Please wear clean clothes to the hospital/surgery center.  FAILURE TO FOLLOW THESE INSTRUCTIONS MAY RESULT IN THE CANCELLATION OF YOUR SURGERY PATIENT SIGNATURE_________________________________  NURSE SIGNATURE__________________________________  ________________________________________________________________________   Adam Phenix  An incentive spirometer is a tool that can help keep your lungs clear and active. This tool measures how well you are filling your lungs with each breath. Taking long deep  breaths may help reverse or decrease the chance of developing breathing (pulmonary) problems (especially infection) following:  A long period of time when you are unable to move or be active. BEFORE THE PROCEDURE   If the spirometer includes an indicator to show your best effort, your nurse or respiratory therapist will set it to a desired goal.  If possible, sit up straight or lean slightly forward. Try not to slouch.  Hold the incentive spirometer in an upright position. INSTRUCTIONS FOR USE   Sit on the edge of your bed if possible, or sit up as far as you can in bed or on a chair.  Hold the incentive spirometer in an upright position.  Breathe out normally.  Place the mouthpiece in your mouth and seal your lips tightly around it.  Breathe in slowly and as deeply as possible, raising the piston or the ball toward the top of the column.  Hold your breath for 3-5 seconds or for as long as possible. Allow the piston or ball to fall to the bottom of the column.  Remove the mouthpiece from your mouth and breathe out normally.  Rest for a few seconds and repeat Steps 1 through 7 at least 10 times every 1-2 hours when you are awake. Take your time and take a few normal breaths between deep breaths.  The spirometer may include an indicator to show your best effort. Use the indicator as a goal to work toward during each repetition.  After each set of 10 deep breaths, practice coughing to be sure your lungs are clear. If you have an incision (the cut made at the time of surgery), support your incision  when coughing by placing a pillow or rolled up towels firmly against it. Once you are able to get out of bed, walk around indoors and cough well. You may stop using the incentive spirometer when instructed by your caregiver.  RISKS AND COMPLICATIONS  Take your time so you do not get dizzy or light-headed.  If you are in pain, you may need to take or ask for pain medication before doing  incentive spirometry. It is harder to take a deep breath if you are having pain. AFTER USE  Rest and breathe slowly and easily.  It can be helpful to keep track of a log of your progress. Your caregiver can provide you with a simple table to help with this. If you are using the spirometer at home, follow these instructions: [place] IF:   You are having difficultly using the spirometer.  You have trouble using the spirometer as often as instructed.  Your pain medication is not giving enough relief while using the spirometer.  You develop fever of 100.5 F (38.1 C) or higher. SEEK IMMEDIATE MEDICAL CARE IF:   You cough up bloody sputum that had not been present before.  You develop fever of 102 F (38.9 C) or greater.  You develop worsening pain at or near the incision site. MAKE SURE YOU:   Understand these instructions.  Will watch your condition.  Will get help right away if you are not doing well or get worse. Document Released: 02/10/2007 Document Revised: 12/23/2011 Document Reviewed: 04/13/2007 ExitCare Patient Information 2014 Exit[employer], [place].   ________________________________________________________________________  WHAT IS A BLOOD TRANSFUSION? Blood Transfusion Information  A transfusion is the replacement of blood or some of its parts. Blood is made up of multiple cells which provide different functions.  Red blood cells carry oxygen and are used for blood loss replacement.  White blood cells fight against infection.  Platelets control bleeding.  Plasma helps clot blood.  Other blood products are available for specialized needs, such as hemophilia or other clotting disorders. BEFORE THE TRANSFUSION  Who gives blood for transfusions?   Healthy volunteers who are fully evaluated to make sure their blood is safe. This is blood bank blood. Transfusion therapy is the safest it has ever been in the practice of medicine. Before blood is taken from a  donor, a complete history is taken to make sure that person has no history of diseases nor engages in risky social behavior (examples are intravenous drug use or sexual activity with multiple partners). The donor's travel history is screened to minimize risk of transmitting infections, such as malaria. The donated blood is tested for signs of infectious diseases, such as HIV and hepatitis. The blood is then tested to be sure it is compatible with you in order to minimize the chance of a transfusion reaction. If you or a relative donates blood, this is often done in anticipation of surgery and is not appropriate for emergency situations. It takes many days to process the donated blood. RISKS AND COMPLICATIONS Although transfusion therapy is very safe and saves many lives, the main dangers of transfusion include:   Getting an infectious disease.  Developing a transfusion reaction. This is an allergic reaction to something in the blood you were given. Every precaution is taken to prevent this. The decision to have a blood transfusion has been considered carefully by your caregiver before blood is given. Blood is not given unless the benefits outweigh the risks. AFTER THE TRANSFUSION  Right after  receiving a blood transfusion, you will usually feel much better and more energetic. This is especially true if your red blood cells have gotten low (anemic). The transfusion raises the level of the red blood cells which carry oxygen, and this usually causes an energy increase.  The nurse administering the transfusion will monitor you carefully for complications. HOME CARE INSTRUCTIONS  No special instructions are needed after a transfusion. You may find your energy is better. Speak with your caregiver about any limitations on activity for underlying diseases you may have. SEEK MEDICAL CARE IF:   Your condition is not improving after your transfusion.  You develop redness or irritation at the intravenous (IV)  site. SEEK IMMEDIATE MEDICAL CARE IF:  Any of the following symptoms occur over the next 12 hours:  Shaking chills.  You have a temperature by mouth above 102 F (38.9 C), not controlled by medicine.  Chest, back, or muscle pain.  People around you feel you are not acting correctly or are confused.  Shortness of breath or difficulty breathing.  Dizziness and fainting.  You get a rash or develop hives.  You have a decrease in urine output.  Your urine turns a dark color or changes to pink, red, or brown. Any of the following symptoms occur over the next 10 days:  You have a temperature by mouth above 102 F (38.9 C), not controlled by medicine.  Shortness of breath.  Weakness after normal activity.  The white part of the eye turns yellow (jaundice).  You have a decrease in the amount of urine or are urinating less often.  Your urine turns a dark color or changes to pink, red, or brown. Document Released: 09/27/2000 Document Revised: 12/23/2011 Document Reviewed: 05/16/2008 [facility] Patient Information 2014 [place], [place].  _______________________________________________________________________

## 2015-11-17 DIAGNOSIS — T84093D Other mechanical complication of internal left knee prosthesis, subsequent encounter: Secondary | ICD-10-CM | POA: Diagnosis not present

## 2015-11-17 DIAGNOSIS — M25562 Pain in left knee: Secondary | ICD-10-CM | POA: Diagnosis not present

## 2015-11-20 ENCOUNTER — Encounter (HOSPITAL_COMMUNITY)
Admission: RE | Admit: 2015-11-20 | Discharge: 2015-11-20 | Disposition: A | Discharge status: Home / Self Care | Payer: PPO | Source: Ambulatory Visit | Attending: Orthopedic Surgery | Admitting: Orthopedic Surgery

## 2015-11-20 ENCOUNTER — Encounter (HOSPITAL_COMMUNITY): Payer: Self-pay

## 2015-11-20 DIAGNOSIS — Z0183 Encounter for blood typing: Secondary | ICD-10-CM | POA: Diagnosis not present

## 2015-11-20 DIAGNOSIS — Z01812 Encounter for preprocedural laboratory examination: Secondary | ICD-10-CM | POA: Diagnosis not present

## 2015-11-20 DIAGNOSIS — T84033A Mechanical loosening of internal left knee prosthetic joint, initial encounter: Secondary | ICD-10-CM | POA: Insufficient documentation

## 2015-11-20 DIAGNOSIS — Y838 Other surgical procedures as the cause of abnormal reaction of the patient, or of later complication, without mention of misadventure at the time of the procedure: Secondary | ICD-10-CM | POA: Diagnosis not present

## 2015-11-20 LAB — CBC
HCT: 37.6 % (ref 36.0–46.0)
HEMOGLOBIN: 12.1 g/dL (ref 12.0–15.0)
MCH: 28.5 pg (ref 26.0–34.0)
MCHC: 32.2 g/dL (ref 30.0–36.0)
MCV: 88.7 fL (ref 78.0–100.0)
Platelets: 249 10*3/uL (ref 150–400)
RBC: 4.24 MIL/uL (ref 3.87–5.11)
RDW: 12.5 % (ref 11.5–15.5)
WBC: 8.3 10*3/uL (ref 4.0–10.5)

## 2015-11-20 LAB — URINALYSIS, ROUTINE W REFLEX MICROSCOPIC
Bilirubin Urine: NEGATIVE
GLUCOSE, UA: NEGATIVE mg/dL
Hgb urine dipstick: NEGATIVE
Ketones, ur: NEGATIVE mg/dL
LEUKOCYTES UA: NEGATIVE
Nitrite: NEGATIVE
PH: 6.5 (ref 5.0–8.0)
Protein, ur: NEGATIVE mg/dL
SPECIFIC GRAVITY, URINE: 1.007 (ref 1.005–1.030)

## 2015-11-20 LAB — PROTIME-INR
INR: 0.95 (ref 0.00–1.49)
PROTHROMBIN TIME: 12.8 s (ref 11.6–15.2)

## 2015-11-20 LAB — BASIC METABOLIC PANEL
ANION GAP: 9 (ref 5–15)
BUN: 23 mg/dL — ABNORMAL HIGH (ref 6–20)
CO2: 28 mmol/L (ref 22–32)
Calcium: 10.3 mg/dL (ref 8.9–10.3)
Chloride: 106 mmol/L (ref 101–111)
Creatinine, Ser: 0.51 mg/dL (ref 0.44–1.00)
GFR calc non Af Amer: >60 mL/min (ref >60)
GLUCOSE: 97 mg/dL (ref 65–99)
Potassium: 4.9 mmol/L (ref 3.5–5.1)
Sodium: 143 mmol/L (ref 135–145)

## 2015-11-20 LAB — APTT: APTT: 32 s (ref 24–37)

## 2015-11-20 LAB — SURGICAL PCR SCREEN
MRSA, PCR: NEGATIVE
Staphylococcus aureus: NEGATIVE

## 2015-11-20 NOTE — Progress Notes (Signed)
EKG 06-07-15 DR [facility] ON CHART

## 2015-11-22 DIAGNOSIS — Z1231 Encounter for screening mammogram for malignant neoplasm of breast: Secondary | ICD-10-CM | POA: Diagnosis not present

## 2015-11-22 NOTE — H&P (Signed)
TOTAL KNEE REVISION ADMISSION H&P  Patient is being admitted for left revision total knee arthroplasty.  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  HPI: [name], 65 y.o. female, has a history of pain and functional disability in the left knee(s) due to failed previous arthroplasty and patient has failed non-surgical conservative treatments for greater than 12 weeks to include NSAID's and/or analgesics and activity modification. The indications for the revision of the total knee arthroplasty are loosening of one or more components. Onset of symptoms was gradual starting 1+ years ago with gradually worsening course since that time.  Prior procedures on the left knee(s) include arthroplasty.  Patient currently rates pain in the left knee(s) at 8 out of 10 with activity. There is worsening of pain with activity and weight bearing, pain that interferes with activities of daily living, pain with passive range of motion and joint swelling.  Patient has evidence of prosthetic loosening by imaging studies. This condition presents safety issues increasing the risk of falls.   There is no current active infection.  Risks, benefits and expectations were discussed with the patient.  Risks including but not limited to the risk of anesthesia, blood clots, nerve damage, blood vessel damage, failure of the prosthesis, infection and up to and including death.  Patient understand the risks, benefits and expectations and wishes to proceed with surgery.   PCP: Horatio Pel, MD  D/C Plans:      Home with HHPT  Post-op Meds:       No Rx given   Tranexamic Acid:      To be given - IV  Decadron:      Is to be given  FYI:     ASA post-op  Norco post-op  CPAP   Patient Active Problem List   Diagnosis Date Noted  . Acute blood loss anemia 11/19/2013  . Osteoarthritis of right knee 11/18/2013  . Total knee replacement status 11/18/2013  . Bladder tumor  12/27/2011  . Breast mass seen on mammogram 06/26/2011   Past Medical History  Diagnosis Date  . Hypertension   . Normal cardiac stress test 08-10-2004  . Bladder tumor     benign  . Blood transfusion   . Urgency of urination   . Arthritis KNEES AND HANDS  . DDD (degenerative disc disease), lumbar   . DDD (degenerative disc disease)   . Anxiety   . OSA on CPAP     uses cpap, pt does not know settings    Past Surgical History  Procedure Laterality Date  . Knee arthroscopy  04-02-2010    RIGHT  . Ganglion cyst excision      LEFT WRIST  . Left shoulder arthroscopy/ debridement labrum/ sad/ open distal clavicle resection  03-25-2011  . Rotator cuff repair  12-23-2008    RIGHT-- W/ TISSUEMEND GRAFT  . Total knee arthroplasty  09-16-2007    LEFT  . Knee arthroscopy w/ meniscectomy  02-20-2006    LEFT  . Cesarean section      X2  . Anterior cruciate ligament repair      LEFT  . Dilation and curettage of uterus      FOR MISSED AB  . Tubal ligation    . Breast surgery  07/10/11    left breast excisional biopsy-- benign  . Tonsillectomy  CHILD  . Transurethral resection of bladder tumor  12/27/2011    Procedure: TRANSURETHRAL RESECTION OF BLADDER TUMOR (TURBT);  Surgeon: Claybon Jabs, MD;  Location: [facility];  Service: Urology;  Laterality: N/A;  mytomicin c  gyrus   . Total knee arthroplasty Right 11/18/2013    Procedure: RIGHT TOTAL KNEE ARTHROPLASTY;  Surgeon: Tobi Bastos, MD;  Location: [facility];  Service: Orthopedics;  Laterality: Right;  . Mass excision Right 06/08/2015    Procedure: EXCISION OF MUCOID CYST RIGHT MIDDLE FINGER;  Surgeon: Daryll Brod, MD;  Location: [place];  Service: Orthopedics;  Laterality: Right;  . Distal interphalangeal joint fusion Right 06/08/2015    Procedure: ARTHROTOMY RIGHT MIDDLE FINGER;  Surgeon: Daryll Brod, MD;  Location: [place];  Service: Orthopedics;  Laterality: Right;  . Abdominal  hysterectomy  1994    W/ LSO  . Left foot bone spur removed and tendon release plate inserted  sept 2016    No prescriptions prior to admission   No Known Allergies   Social History  Substance Use Topics  . Smoking status: Former Smoker -- 0.25 packs/day for 50 years    Types: Cigarettes    Quit date: 06/25/1980  . Smokeless tobacco: Never Used  . Alcohol Use: 0.6 oz/week    1 Glasses of wine per week     Comment: occasional    Family History  Problem Relation Age of Onset  . Cancer Mother     colon  . Cancer Father     lung     Review of Systems  Constitutional: Negative.   HENT: Negative.   Eyes: Negative.   Respiratory: Negative.   Cardiovascular: Negative.   Gastrointestinal: Negative.   Genitourinary: Positive for urgency.  Musculoskeletal: Positive for back pain and joint pain.  Skin: Negative.   Neurological: Negative.   Endo/Heme/Allergies: Negative.   Psychiatric/Behavioral: The patient is nervous/anxious.      Objective:  Physical Exam  Constitutional: She is oriented to person, place, and time. She appears well-developed.  HENT:  Head: Normocephalic.  Eyes: Pupils are equal, round, and reactive to light.  Neck: Neck supple. No JVD present. No tracheal deviation present. No thyromegaly present.  Cardiovascular: Normal rate, regular rhythm, normal heart sounds and intact distal pulses.   Respiratory: Effort normal and breath sounds normal. No stridor. No respiratory distress. She has no wheezes.  GI: Soft. There is no tenderness. There is no guarding.  Musculoskeletal:       Left knee: She exhibits decreased range of motion, swelling, laceration (healed previous incision) and bony tenderness. She exhibits no ecchymosis, no deformity and no erythema. Tenderness found.  Lymphadenopathy:    She has no cervical adenopathy.  Neurological: She is alert and oriented to person, place, and time.  Skin: Skin is warm and dry.  Psychiatric: She has a normal mood  and affect.      Labs:  Estimated body mass index is 27.82 kg/(m^2) as calculated from the following:   Height as of 06/08/15: 5\' 3"  (1.6 m).   Weight as of 06/08/15: 71.215 kg (157 lb).  Imaging Review Plain radiographs demonstrate failure of the left knee(s). The overall alignment is neutral.There is evidence of loosening of at least the tibial components. The bone quality appears to be good for age and reported activity level.   Assessment/Plan:  Left knee with failed previous arthroplasty.   The patient history, physical examination, clinical judgment of the provider and imaging studies are consistent with failure of the left knee(s), previous total knee arthroplasty. Revision total knee arthroplasty is deemed medically  necessary. The treatment options including medical management, injection therapy, arthroscopy and revision arthroplasty were discussed at length. The risks and benefits of revision total knee arthroplasty were presented and reviewed. The risks due to aseptic loosening, infection, stiffness, patella tracking problems, thromboembolic complications and other imponderables were discussed. The patient acknowledged the explanation, agreed to proceed with the plan and consent was signed. Patient is being admitted for inpatient treatment for surgery, pain control, PT, OT, prophylactic antibiotics, VTE prophylaxis, progressive ambulation and ADL's and discharge planning.The patient is planning to be discharged home with home health services.     [place] Kayleena Eke   PA-C  11/22/2015, 11:01 AM

## 2015-11-27 ENCOUNTER — Inpatient Hospital Stay (HOSPITAL_COMMUNITY): Payer: PPO | Admitting: Anesthesiology

## 2015-11-27 ENCOUNTER — Encounter (HOSPITAL_COMMUNITY)
Admission: RE | Disposition: A | Discharge status: Home Health | Payer: Self-pay | Source: Ambulatory Visit | Attending: Orthopedic Surgery

## 2015-11-27 ENCOUNTER — Inpatient Hospital Stay (HOSPITAL_COMMUNITY)
Admission: RE | Admit: 2015-11-27 | Discharge: 2015-11-29 | DRG: 465 | Disposition: A | Discharge status: Home Health | Payer: PPO | Source: Ambulatory Visit | Attending: Orthopedic Surgery | Admitting: Orthopedic Surgery

## 2015-11-27 ENCOUNTER — Encounter (HOSPITAL_COMMUNITY): Payer: Self-pay | Admitting: *Deleted

## 2015-11-27 DIAGNOSIS — E663 Overweight: Secondary | ICD-10-CM | POA: Diagnosis present

## 2015-11-27 DIAGNOSIS — Z800 Family history of malignant neoplasm of digestive organs: Secondary | ICD-10-CM

## 2015-11-27 DIAGNOSIS — Z01812 Encounter for preprocedural laboratory examination: Secondary | ICD-10-CM | POA: Diagnosis not present

## 2015-11-27 DIAGNOSIS — I10 Essential (primary) hypertension: Secondary | ICD-10-CM | POA: Diagnosis not present

## 2015-11-27 DIAGNOSIS — M170 Bilateral primary osteoarthritis of knee: Secondary | ICD-10-CM | POA: Diagnosis present

## 2015-11-27 DIAGNOSIS — Y831 Surgical operation with implant of artificial internal device as the cause of abnormal reaction of the patient, or of later complication, without mention of misadventure at the time of the procedure: Secondary | ICD-10-CM | POA: Diagnosis not present

## 2015-11-27 DIAGNOSIS — G4733 Obstructive sleep apnea (adult) (pediatric): Secondary | ICD-10-CM | POA: Diagnosis not present

## 2015-11-27 DIAGNOSIS — Z6828 Body mass index (BMI) 28.0-28.9, adult: Secondary | ICD-10-CM

## 2015-11-27 DIAGNOSIS — Z96652 Presence of left artificial knee joint: Secondary | ICD-10-CM | POA: Diagnosis not present

## 2015-11-27 DIAGNOSIS — T84033A Mechanical loosening of internal left knee prosthetic joint, initial encounter: Secondary | ICD-10-CM | POA: Diagnosis not present

## 2015-11-27 DIAGNOSIS — Z89529 Acquired absence of unspecified knee: Secondary | ICD-10-CM

## 2015-11-27 DIAGNOSIS — Z801 Family history of malignant neoplasm of trachea, bronchus and lung: Secondary | ICD-10-CM | POA: Diagnosis not present

## 2015-11-27 DIAGNOSIS — Y792 Prosthetic and other implants, materials and accessory orthopedic devices associated with adverse incidents: Secondary | ICD-10-CM | POA: Diagnosis not present

## 2015-11-27 DIAGNOSIS — T8489XA Other specified complication of internal orthopedic prosthetic devices, implants and grafts, initial encounter: Secondary | ICD-10-CM | POA: Diagnosis not present

## 2015-11-27 DIAGNOSIS — Z87891 Personal history of nicotine dependence: Secondary | ICD-10-CM

## 2015-11-27 DIAGNOSIS — T8454XA Infection and inflammatory reaction due to internal left knee prosthesis, initial encounter: Secondary | ICD-10-CM

## 2015-11-27 DIAGNOSIS — B9689 Other specified bacterial agents as the cause of diseases classified elsewhere: Secondary | ICD-10-CM | POA: Diagnosis not present

## 2015-11-27 DIAGNOSIS — M00862 Arthritis due to other bacteria, left knee: Secondary | ICD-10-CM | POA: Diagnosis not present

## 2015-11-27 DIAGNOSIS — M25562 Pain in left knee: Secondary | ICD-10-CM | POA: Diagnosis not present

## 2015-11-27 HISTORY — PX: TOTAL KNEE REVISION: SHX996

## 2015-11-27 LAB — TYPE AND SCREEN
ABO/RH(D): A POS
Antibody Screen: NEGATIVE

## 2015-11-27 LAB — SYNOVIAL CELL COUNT + DIFF, W/ CRYSTALS
CRYSTALS FLUID: NONE SEEN
WBC, Synovial: UNDETERMINED /mm3 (ref 0–200)

## 2015-11-27 SURGERY — TOTAL KNEE REVISION
Anesthesia: General | Site: Knee | Laterality: Left

## 2015-11-27 MED ORDER — METHOCARBAMOL 500 MG PO TABS
500.0000 mg | ORAL_TABLET | Freq: Four times a day (QID) | ORAL | Status: DC | PRN
  Administered 2015-11-28 – 2015-11-29 (×5): 500 mg via ORAL
  Filled 2015-11-27 (×5): qty 1

## 2015-11-27 MED ORDER — ACETAMINOPHEN 10 MG/ML IV SOLN
INTRAVENOUS | Status: AC
  Filled 2015-11-27: qty 100

## 2015-11-27 MED ORDER — DEXAMETHASONE SODIUM PHOSPHATE 10 MG/ML IJ SOLN
10.0000 mg | Freq: Once | INTRAMUSCULAR | Status: AC
  Administered 2015-11-27: 10 mg via INTRAVENOUS

## 2015-11-27 MED ORDER — PHENOL 1.4 % MT LIQD: 1.0000 | OROMUCOSAL | Status: DC | PRN

## 2015-11-27 MED ORDER — FENTANYL CITRATE (PF) 250 MCG/5ML IJ SOLN
INTRAMUSCULAR | Status: AC
  Filled 2015-11-27: qty 5

## 2015-11-27 MED ORDER — HYDROMORPHONE HCL 1 MG/ML IJ SOLN
INTRAMUSCULAR | Status: AC
  Filled 2015-11-27: qty 1

## 2015-11-27 MED ORDER — 0.9 % SODIUM CHLORIDE (POUR BTL) OPTIME
TOPICAL | Status: DC | PRN
Start: 2015-11-27 — End: 2015-11-27
  Administered 2015-11-27: 1000 mL

## 2015-11-27 MED ORDER — MIDAZOLAM HCL 2 MG/2ML IJ SOLN
INTRAMUSCULAR | Status: AC
  Filled 2015-11-27: qty 2

## 2015-11-27 MED ORDER — VANCOMYCIN HCL 1000 MG IV SOLR
INTRAVENOUS | Status: DC | PRN
  Administered 2015-11-27: 3 g

## 2015-11-27 MED ORDER — METHOCARBAMOL 1000 MG/10ML IJ SOLN
500.0000 mg | Freq: Four times a day (QID) | INTRAVENOUS | Status: DC | PRN
  Administered 2015-11-27: 500 mg via INTRAVENOUS
  Filled 2015-11-27 (×2): qty 5

## 2015-11-27 MED ORDER — HYDROMORPHONE HCL 1 MG/ML IJ SOLN
0.2500 mg | INTRAMUSCULAR | Status: DC | PRN
  Administered 2015-11-27 (×2): 0.5 mg via INTRAVENOUS

## 2015-11-27 MED ORDER — SODIUM CHLORIDE 0.9 % IJ SOLN
INTRAMUSCULAR | Status: AC
  Filled 2015-11-27: qty 50

## 2015-11-27 MED ORDER — MEPERIDINE HCL 50 MG/ML IJ SOLN: 6.2500 mg | INTRAMUSCULAR | Status: DC | PRN

## 2015-11-27 MED ORDER — NEOSTIGMINE METHYLSULFATE 10 MG/10ML IV SOLN
INTRAVENOUS | Status: AC
  Filled 2015-11-27: qty 1

## 2015-11-27 MED ORDER — TOBRAMYCIN SULFATE 1.2 G IJ SOLR
INTRAMUSCULAR | Status: AC
  Filled 2015-11-27: qty 3.6

## 2015-11-27 MED ORDER — LACTATED RINGERS IV SOLN
INTRAVENOUS | Status: DC
  Administered 2015-11-27: 11:00:00 via INTRAVENOUS

## 2015-11-27 MED ORDER — BISACODYL 10 MG RE SUPP: 10.0000 mg | Freq: Every day | RECTAL | Status: DC | PRN

## 2015-11-27 MED ORDER — ONDANSETRON HCL 4 MG PO TABS: 4.0000 mg | ORAL_TABLET | Freq: Four times a day (QID) | ORAL | Status: DC | PRN

## 2015-11-27 MED ORDER — IRBESARTAN 300 MG PO TABS
300.0000 mg | ORAL_TABLET | Freq: Every day | ORAL | Status: DC
  Administered 2015-11-29: 300 mg via ORAL
  Filled 2015-11-27 (×3): qty 1

## 2015-11-27 MED ORDER — ACETAMINOPHEN 10 MG/ML IV SOLN
INTRAVENOUS | Status: DC | PRN
  Administered 2015-11-27: 1000 mg via INTRAVENOUS

## 2015-11-27 MED ORDER — ALUM & MAG HYDROXIDE-SIMETH 200-200-20 MG/5ML PO SUSP: 30.0000 mL | ORAL | Status: DC | PRN

## 2015-11-27 MED ORDER — HYDROMORPHONE HCL 2 MG/ML IJ SOLN
INTRAMUSCULAR | Status: AC
  Filled 2015-11-27: qty 1

## 2015-11-27 MED ORDER — BUPIVACAINE-EPINEPHRINE 0.25% -1:200000 IJ SOLN
INTRAMUSCULAR | Status: DC | PRN
  Administered 2015-11-27: 30 mL

## 2015-11-27 MED ORDER — POLYETHYLENE GLYCOL 3350 17 G PO PACK
17.0000 g | PACK | Freq: Two times a day (BID) | ORAL | Status: DC
  Administered 2015-11-27 – 2015-11-29 (×4): 17 g via ORAL

## 2015-11-27 MED ORDER — ROCURONIUM BROMIDE 100 MG/10ML IV SOLN
INTRAVENOUS | Status: AC
  Filled 2015-11-27: qty 1

## 2015-11-27 MED ORDER — FENTANYL CITRATE (PF) 100 MCG/2ML IJ SOLN
INTRAMUSCULAR | Status: DC | PRN
  Administered 2015-11-27 (×3): 50 ug via INTRAVENOUS
  Administered 2015-11-27 (×2): 100 ug via INTRAVENOUS

## 2015-11-27 MED ORDER — ASPIRIN EC 325 MG PO TBEC
325.0000 mg | DELAYED_RELEASE_TABLET | Freq: Two times a day (BID) | ORAL | Status: DC
  Administered 2015-11-28 – 2015-11-29 (×3): 325 mg via ORAL
  Filled 2015-11-27 (×5): qty 1

## 2015-11-27 MED ORDER — CEFAZOLIN SODIUM-DEXTROSE 2-3 GM-% IV SOLR
INTRAVENOUS | Status: AC
  Filled 2015-11-27: qty 50

## 2015-11-27 MED ORDER — SODIUM CHLORIDE 0.9% FLUSH
10.0000 mL | INTRAVENOUS | Status: DC | PRN
  Administered 2015-11-28 – 2015-11-29 (×2): 10 mL
  Filled 2015-11-27 (×2): qty 40

## 2015-11-27 MED ORDER — PHENYLEPHRINE HCL 10 MG/ML IJ SOLN
INTRAMUSCULAR | Status: DC | PRN
  Administered 2015-11-27 (×2): 40 ug via INTRAVENOUS

## 2015-11-27 MED ORDER — ONDANSETRON HCL 4 MG/2ML IJ SOLN: 4.0000 mg | Freq: Four times a day (QID) | INTRAMUSCULAR | Status: DC | PRN

## 2015-11-27 MED ORDER — FERROUS SULFATE 325 (65 FE) MG PO TABS
325.0000 mg | ORAL_TABLET | Freq: Three times a day (TID) | ORAL | Status: DC
  Administered 2015-11-27 – 2015-11-29 (×5): 325 mg via ORAL
  Filled 2015-11-27 (×8): qty 1

## 2015-11-27 MED ORDER — MIDAZOLAM HCL 5 MG/5ML IJ SOLN
INTRAMUSCULAR | Status: DC | PRN
  Administered 2015-11-27: 2 mg via INTRAVENOUS

## 2015-11-27 MED ORDER — HYDROMORPHONE HCL 1 MG/ML IJ SOLN
INTRAMUSCULAR | Status: DC | PRN
  Administered 2015-11-27: 1 mg via INTRAVENOUS
  Administered 2015-11-27 (×2): 0.5 mg via INTRAVENOUS

## 2015-11-27 MED ORDER — AMLODIPINE-VALSARTAN-HCTZ 10-320-25 MG PO TABS: 1.0000 | ORAL_TABLET | Freq: Every morning | ORAL | Status: DC

## 2015-11-27 MED ORDER — AMLODIPINE BESYLATE 10 MG PO TABS
10.0000 mg | ORAL_TABLET | Freq: Every day | ORAL | Status: DC
  Administered 2015-11-29: 10 mg via ORAL
  Filled 2015-11-27 (×3): qty 1

## 2015-11-27 MED ORDER — STERILE WATER FOR IRRIGATION IR SOLN
Status: DC | PRN
  Administered 2015-11-27: 2000 mL

## 2015-11-27 MED ORDER — PROPOFOL 10 MG/ML IV BOLUS
INTRAVENOUS | Status: DC | PRN
  Administered 2015-11-27: 170 mg via INTRAVENOUS

## 2015-11-27 MED ORDER — ROCURONIUM BROMIDE 100 MG/10ML IV SOLN
INTRAVENOUS | Status: DC | PRN
  Administered 2015-11-27: 50 mg via INTRAVENOUS

## 2015-11-27 MED ORDER — DIPHENHYDRAMINE HCL 25 MG PO CAPS: 25.0000 mg | ORAL_CAPSULE | Freq: Four times a day (QID) | ORAL | Status: DC | PRN

## 2015-11-27 MED ORDER — PHENYLEPHRINE 40 MCG/ML (10ML) SYRINGE FOR IV PUSH (FOR BLOOD PRESSURE SUPPORT)
PREFILLED_SYRINGE | INTRAVENOUS | Status: AC
  Filled 2015-11-27: qty 10

## 2015-11-27 MED ORDER — HYDROCODONE-ACETAMINOPHEN 7.5-325 MG PO TABS
1.0000 | ORAL_TABLET | ORAL | Status: DC
  Administered 2015-11-27 (×2): 2 via ORAL
  Administered 2015-11-27: 1 via ORAL
  Administered 2015-11-27: 2 via ORAL
  Administered 2015-11-28 (×2): 1 via ORAL
  Administered 2015-11-28 – 2015-11-29 (×6): 2 via ORAL
  Filled 2015-11-27: qty 1
  Filled 2015-11-27 (×11): qty 2

## 2015-11-27 MED ORDER — LIDOCAINE HCL (CARDIAC) 20 MG/ML IV SOLN
INTRAVENOUS | Status: DC | PRN
  Administered 2015-11-27: 50 mg via INTRAVENOUS

## 2015-11-27 MED ORDER — VANCOMYCIN HCL IN DEXTROSE 1-5 GM/200ML-% IV SOLN
1000.0000 mg | Freq: Two times a day (BID) | INTRAVENOUS | Status: DC
  Administered 2015-11-27 – 2015-11-29 (×5): 1000 mg via INTRAVENOUS
  Filled 2015-11-27 (×5): qty 200

## 2015-11-27 MED ORDER — SODIUM CHLORIDE 0.9 % IV SOLN
INTRAVENOUS | Status: DC
  Administered 2015-11-27 – 2015-11-28 (×2): via INTRAVENOUS
  Filled 2015-11-27 (×7): qty 1000

## 2015-11-27 MED ORDER — FENTANYL CITRATE (PF) 100 MCG/2ML IJ SOLN
INTRAMUSCULAR | Status: AC
  Filled 2015-11-27: qty 2

## 2015-11-27 MED ORDER — KETOROLAC TROMETHAMINE 30 MG/ML IJ SOLN
INTRAMUSCULAR | Status: DC | PRN
  Administered 2015-11-27: 30 mg

## 2015-11-27 MED ORDER — ONDANSETRON HCL 4 MG/2ML IJ SOLN
INTRAMUSCULAR | Status: DC | PRN
  Administered 2015-11-27: 4 mg via INTRAVENOUS

## 2015-11-27 MED ORDER — DOCUSATE SODIUM 100 MG PO CAPS
100.0000 mg | ORAL_CAPSULE | Freq: Two times a day (BID) | ORAL | Status: DC
Start: 2015-11-27 — End: 2015-11-29
  Administered 2015-11-27 – 2015-11-29 (×4): 100 mg via ORAL

## 2015-11-27 MED ORDER — SODIUM CHLORIDE 0.9 % IJ SOLN
INTRAMUSCULAR | Status: DC | PRN
  Administered 2015-11-27: 30 mL

## 2015-11-27 MED ORDER — KETOROLAC TROMETHAMINE 30 MG/ML IJ SOLN
INTRAMUSCULAR | Status: AC
  Filled 2015-11-27: qty 1

## 2015-11-27 MED ORDER — BUPIVACAINE-EPINEPHRINE (PF) 0.25% -1:200000 IJ SOLN
INTRAMUSCULAR | Status: AC
  Filled 2015-11-27: qty 30

## 2015-11-27 MED ORDER — GLYCOPYRROLATE 0.2 MG/ML IJ SOLN
INTRAMUSCULAR | Status: AC
  Filled 2015-11-27: qty 3

## 2015-11-27 MED ORDER — METOCLOPRAMIDE HCL 5 MG/ML IJ SOLN: 10.0000 mg | Freq: Once | INTRAMUSCULAR | Status: DC | PRN

## 2015-11-27 MED ORDER — ALPRAZOLAM 0.25 MG PO TABS
0.2500 mg | ORAL_TABLET | Freq: Three times a day (TID) | ORAL | Status: DC | PRN
Start: 2015-11-27 — End: 2015-11-29
  Administered 2015-11-28: 0.25 mg via ORAL
  Filled 2015-11-27 (×2): qty 1

## 2015-11-27 MED ORDER — LACTATED RINGERS IV SOLN
INTRAVENOUS | Status: DC | PRN
  Administered 2015-11-27 (×3): via INTRAVENOUS

## 2015-11-27 MED ORDER — MENTHOL 3 MG MT LOZG
1.0000 | LOZENGE | OROMUCOSAL | Status: DC | PRN
  Filled 2015-11-27: qty 9

## 2015-11-27 MED ORDER — VANCOMYCIN HCL 1000 MG IV SOLR
INTRAVENOUS | Status: AC
  Filled 2015-11-27: qty 3000

## 2015-11-27 MED ORDER — DEXAMETHASONE SODIUM PHOSPHATE 10 MG/ML IJ SOLN
10.0000 mg | Freq: Once | INTRAMUSCULAR | Status: AC
  Administered 2015-11-28: 10 mg via INTRAVENOUS
  Filled 2015-11-27: qty 1

## 2015-11-27 MED ORDER — CELECOXIB 200 MG PO CAPS
200.0000 mg | ORAL_CAPSULE | Freq: Two times a day (BID) | ORAL | Status: DC
  Administered 2015-11-27 – 2015-11-29 (×4): 200 mg via ORAL
  Filled 2015-11-27 (×5): qty 1

## 2015-11-27 MED ORDER — CETYLPYRIDINIUM CHLORIDE 0.05 % MT LIQD
7.0000 mL | Freq: Two times a day (BID) | OROMUCOSAL | Status: DC
  Administered 2015-11-28: 7 mL via OROMUCOSAL

## 2015-11-27 MED ORDER — SODIUM CHLORIDE 0.9 % IR SOLN
Status: DC | PRN
  Administered 2015-11-27: 4000 mL

## 2015-11-27 MED ORDER — HYDROMORPHONE HCL 1 MG/ML IJ SOLN
0.5000 mg | INTRAMUSCULAR | Status: DC | PRN
  Administered 2015-11-27: 1 mg via INTRAVENOUS
  Administered 2015-11-27 (×2): 2 mg via INTRAVENOUS
  Administered 2015-11-28: 1 mg via INTRAVENOUS
  Administered 2015-11-28 (×2): 2 mg via INTRAVENOUS
  Administered 2015-11-28: 1 mg via INTRAVENOUS
  Administered 2015-11-29: 2 mg via INTRAVENOUS
  Filled 2015-11-27 (×5): qty 2
  Filled 2015-11-27 (×4): qty 1

## 2015-11-27 MED ORDER — HYDROCHLOROTHIAZIDE 25 MG PO TABS
25.0000 mg | ORAL_TABLET | Freq: Every day | ORAL | Status: DC
  Administered 2015-11-28 – 2015-11-29 (×2): 25 mg via ORAL
  Filled 2015-11-27 (×3): qty 1

## 2015-11-27 MED ORDER — TOBRAMYCIN SULFATE 1.2 G IJ SOLR
INTRAMUSCULAR | Status: DC | PRN
  Administered 2015-11-27: 3

## 2015-11-27 MED ORDER — LIDOCAINE HCL (CARDIAC) 20 MG/ML IV SOLN
INTRAVENOUS | Status: AC
  Filled 2015-11-27: qty 5

## 2015-11-27 MED ORDER — MAGNESIUM CITRATE PO SOLN: 1.0000 | Freq: Once | ORAL | Status: DC | PRN

## 2015-11-27 MED ORDER — AMLODIPINE BESYLATE 5 MG PO TABS
5.0000 mg | ORAL_TABLET | Freq: Once | ORAL | Status: AC
  Administered 2015-11-27: 5 mg via ORAL
  Filled 2015-11-27: qty 1

## 2015-11-27 MED ORDER — TRANEXAMIC ACID 1000 MG/10ML IV SOLN
1000.0000 mg | Freq: Once | INTRAVENOUS | Status: AC
  Administered 2015-11-27: 1000 mg via INTRAVENOUS
  Filled 2015-11-27 (×2): qty 10

## 2015-11-27 MED ORDER — PROPOFOL 10 MG/ML IV BOLUS
INTRAVENOUS | Status: AC
  Filled 2015-11-27: qty 40

## 2015-11-27 MED ORDER — SERTRALINE HCL 50 MG PO TABS
50.0000 mg | ORAL_TABLET | Freq: Every morning | ORAL | Status: DC
  Administered 2015-11-27 – 2015-11-29 (×3): 50 mg via ORAL
  Filled 2015-11-27 (×3): qty 1

## 2015-11-27 MED ORDER — CHLORHEXIDINE GLUCONATE 0.12 % MT SOLN
15.0000 mL | Freq: Two times a day (BID) | OROMUCOSAL | Status: DC
  Administered 2015-11-27 – 2015-11-28 (×2): 15 mL via OROMUCOSAL
  Filled 2015-11-27 (×5): qty 15

## 2015-11-27 MED ORDER — ZOLPIDEM TARTRATE 5 MG PO TABS
5.0000 mg | ORAL_TABLET | Freq: Every evening | ORAL | Status: DC | PRN
  Administered 2015-11-27: 5 mg via ORAL
  Filled 2015-11-27: qty 1

## 2015-11-27 MED ORDER — NEOSTIGMINE METHYLSULFATE 10 MG/10ML IV SOLN
INTRAVENOUS | Status: DC | PRN
  Administered 2015-11-27: 2 mg via INTRAVENOUS

## 2015-11-27 MED ORDER — GLYCOPYRROLATE 0.2 MG/ML IJ SOLN
INTRAMUSCULAR | Status: DC | PRN
  Administered 2015-11-27: 0.3 mg via INTRAVENOUS

## 2015-11-27 MED ORDER — METOCLOPRAMIDE HCL 5 MG/ML IJ SOLN: 5.0000 mg | Freq: Three times a day (TID) | INTRAMUSCULAR | Status: DC | PRN

## 2015-11-27 MED ORDER — METOCLOPRAMIDE HCL 10 MG PO TABS: 5.0000 mg | ORAL_TABLET | Freq: Three times a day (TID) | ORAL | Status: DC | PRN

## 2015-11-27 MED ORDER — ONDANSETRON HCL 4 MG/2ML IJ SOLN
INTRAMUSCULAR | Status: AC
  Filled 2015-11-27: qty 2

## 2015-11-27 MED ORDER — CEFAZOLIN SODIUM-DEXTROSE 2-3 GM-% IV SOLR
2.0000 g | INTRAVENOUS | Status: AC
  Administered 2015-11-27: 2 g via INTRAVENOUS

## 2015-11-27 SURGICAL SUPPLY — 63 items
BANDAGE ACE 6X5 VEL STRL LF (GAUZE/BANDAGES/DRESSINGS) ×2 IMPLANT
BLADE SAW SGTL 13.0X1.19X90.0M (BLADE) ×2 IMPLANT
BLADE SAW SGTL 81X20 HD (BLADE) ×2 IMPLANT
BONE CEMENT GENTAMICIN (Cement) ×6 IMPLANT
BRUSH FEMORAL CANAL (MISCELLANEOUS) ×2 IMPLANT
CEMENT BONE GENTAMICIN 40 (Cement) ×3 IMPLANT
CLOTH BEACON ORANGE TIMEOUT ST (SAFETY) ×2 IMPLANT
CUFF TOURN SGL QUICK 34 (TOURNIQUET CUFF) ×1
CUFF TRNQT CYL 34X4X40X1 (TOURNIQUET CUFF) ×1 IMPLANT
DRAPE U-SHAPE 47X51 STRL (DRAPES) ×2 IMPLANT
DRSG ADAPTIC 3X8 NADH LF (GAUZE/BANDAGES/DRESSINGS) IMPLANT
DRSG AQUACEL AG ADV 3.5X10 (GAUZE/BANDAGES/DRESSINGS) ×2 IMPLANT
DRSG PAD ABDOMINAL 8X10 ST (GAUZE/BANDAGES/DRESSINGS) IMPLANT
DRSG TEGADERM 4X4.75 (GAUZE/BANDAGES/DRESSINGS) IMPLANT
DURAPREP 26ML APPLICATOR (WOUND CARE) ×4 IMPLANT
ELECT REM PT RETURN 9FT ADLT (ELECTROSURGICAL) ×2
ELECTRODE REM PT RTRN 9FT ADLT (ELECTROSURGICAL) ×1 IMPLANT
GAUZE SPONGE 2X2 8PLY STRL LF (GAUZE/BANDAGES/DRESSINGS) IMPLANT
GAUZE SPONGE 4X4 12PLY STRL (GAUZE/BANDAGES/DRESSINGS) IMPLANT
GLOVE BIOGEL M 7.0 STRL (GLOVE) IMPLANT
GLOVE BIOGEL PI IND STRL 6.5 (GLOVE) ×1 IMPLANT
GLOVE BIOGEL PI IND STRL 7.5 (GLOVE) ×2 IMPLANT
GLOVE BIOGEL PI IND STRL 8.5 (GLOVE) ×1 IMPLANT
GLOVE BIOGEL PI INDICATOR 6.5 (GLOVE) ×1
GLOVE BIOGEL PI INDICATOR 7.5 (GLOVE) ×2
GLOVE BIOGEL PI INDICATOR 8.5 (GLOVE) ×1
GLOVE ECLIPSE 8.0 STRL XLNG CF (GLOVE) ×2 IMPLANT
GLOVE ORTHO TXT STRL SZ7.5 (GLOVE) ×4 IMPLANT
GLOVE SURG SS PI 6.5 STRL IVOR (GLOVE) ×2 IMPLANT
GLOVE SURG SS PI 7.5 STRL IVOR (GLOVE) ×2 IMPLANT
GOWN STRL REUS W/ TWL LRG LVL3 (GOWN DISPOSABLE) ×1 IMPLANT
GOWN STRL REUS W/TWL LRG LVL3 (GOWN DISPOSABLE) ×5 IMPLANT
GOWN STRL REUS W/TWL XL LVL3 (GOWN DISPOSABLE) ×2 IMPLANT
HANDPIECE INTERPULSE COAX TIP (DISPOSABLE) ×1
IMMOBILIZER KNEE 20 (SOFTGOODS) ×2
IMMOBILIZER KNEE 20 THIGH 36 (SOFTGOODS) ×1 IMPLANT
LIQUID BAND (GAUZE/BANDAGES/DRESSINGS) ×2 IMPLANT
MANIFOLD NEPTUNE II (INSTRUMENTS) ×2 IMPLANT
NS IRRIG 1000ML POUR BTL (IV SOLUTION) ×2 IMPLANT
PACK TOTAL KNEE CUSTOM (KITS) ×2 IMPLANT
PADDING CAST COTTON 6X4 STRL (CAST SUPPLIES) IMPLANT
POSITIONER SURGICAL ARM (MISCELLANEOUS) ×2 IMPLANT
SET HNDPC FAN SPRY TIP SCT (DISPOSABLE) ×1 IMPLANT
SET PAD KNEE POSITIONER (MISCELLANEOUS) ×2 IMPLANT
SPACER KASM MOLD SM37*60 KNEE (Spacer) ×2 IMPLANT
SPACER TIBIAL SM39*58 KNEE (Spacer) ×2 IMPLANT
SPONGE GAUZE 2X2 STER 10/PKG (GAUZE/BANDAGES/DRESSINGS)
SPONGE LAP 18X18 X RAY DECT (DISPOSABLE) ×2 IMPLANT
STAPLER VISISTAT 35W (STAPLE) IMPLANT
SUT MNCRL AB 3-0 PS2 18 (SUTURE) ×2 IMPLANT
SUT PDS AB 1 CT1 27 (SUTURE) ×8 IMPLANT
SUT VIC AB 1 CT1 36 (SUTURE) ×2 IMPLANT
SUT VIC AB 2-0 CT1 27 (SUTURE) ×3
SUT VIC AB 2-0 CT1 TAPERPNT 27 (SUTURE) ×3 IMPLANT
SUT VLOC 180 0 24IN GS25 (SUTURE) ×2 IMPLANT
SYR 10ML ECCENTRIC (SYRINGE) ×2 IMPLANT
SYR 50ML LL SCALE MARK (SYRINGE) ×2 IMPLANT
TOWER CARTRIDGE SMART MIX (DISPOSABLE) ×2 IMPLANT
TRAY FOLEY W/METER SILVER 14FR (SET/KITS/TRAYS/PACK) ×2 IMPLANT
TUBE KAMVAC SUCTION (TUBING) IMPLANT
WATER STERILE IRR 1500ML POUR (IV SOLUTION) ×2 IMPLANT
WRAP KNEE MAXI GEL POST OP (GAUZE/BANDAGES/DRESSINGS) ×2 IMPLANT
YANKAUER SUCT BULB TIP 10FT TU (MISCELLANEOUS) IMPLANT

## 2015-11-27 NOTE — Evaluation (Signed)
Physical Therapy Evaluation Patient Details Name: [name] MRN: [id number] DOB: [birth year] Today's Date: 11/27/2015   History of Present Illness  s/p failed L TKA, placement of antibiotic spacer 11/27/15  Clinical Impression  Patient tolerated ambulation x 50' today, PWB. Patient will benefit from PT to address problems listed in the note below.    Follow Up Recommendations Home health PT;Supervision/Assistance - 24 hour    Equipment Recommendations  None recommended by PT    Recommendations for Other Services       Precautions / Restrictions Precautions Precautions: Knee Required Braces or Orthoses: Knee Immobilizer - Left Knee Immobilizer - Left: On at all times Restrictions Weight Bearing Restrictions: Yes LLE Weight Bearing: Partial weight bearing      Mobility  Bed Mobility Overal bed mobility: Needs Assistance Bed Mobility: Supine to Sit     Supine to sit: Min assist     General bed mobility comments: support the left leg  Transfers Overall transfer level: Needs assistance Equipment used: Rolling walker (2 wheeled) Transfers: Sit to/from Stand Sit to Stand: Min assist         General transfer comment: cues  for [facility]  Ambulation/Gait Ambulation/Gait assistance: Min assist Ambulation Distance (Feet): 50 Feet Assistive device: Rolling walker (2 wheeled) Gait Pattern/deviations: Step-to pattern;Antalgic     General Gait Details: cues for [facility]  Stairs            Wheelchair Mobility    Modified Rankin (Stroke Patients Only)       Balance                                             Pertinent Vitals/Pain Pain Assessment: 0-10 Pain Score: 6  Pain Location: L knee crunching Pain Intervention(s): Limited activity within patient's tolerance;Monitored during session;Premedicated before session;Repositioned    Home Living Family/patient expects to be discharged to:: Private residence Living Arrangements:  Spouse/significant other Available Help at Discharge: Family Type of Home: House Home Access: Stairs to enter Entrance Stairs-Rails: Can reach both Entrance Stairs-Number of Steps: 3 Home Layout: One level Home Equipment: Walker - 2 wheels;Cane - single point;Crutches;Bedside commode;Shower seat      Prior Function Level of Independence: Independent               Hand Dominance        Extremity/Trunk Assessment   Upper Extremity Assessment: Overall WFL for tasks assessed           Lower Extremity Assessment: LLE deficits/detail   LLE Deficits / Details: assist  with lifting the leg  Cervical / Trunk Assessment: Normal  Communication   Communication: No difficulties  Cognition Arousal/Alertness: Awake/alert Behavior During Therapy: WFL for tasks assessed/performed Overall Cognitive Status: Within Functional Limits for tasks assessed                      General Comments      Exercises        Assessment/Plan    PT Assessment Patient needs continued PT services  PT Diagnosis Difficulty walking   PT Problem List Decreased strength;Decreased activity tolerance;Decreased mobility;Decreased knowledge of use of DME;Decreased safety awareness;Decreased knowledge of precautions  PT Treatment Interventions DME instruction;Gait training;Stair training;Functional mobility training;Therapeutic activities;Therapeutic exercise;Patient/family education   PT Goals (Current goals can be found in the Care Plan section) Acute Rehab PT Goals Patient Stated Goal:  to get this over PT Goal Formulation: With patient/family Time For Goal Achievement: 12/01/15 Potential to Achieve Goals: Good    Frequency 7X/week   Barriers to discharge        Co-evaluation               End of Session Equipment Utilized During Treatment: Left knee immobilizer Activity Tolerance: Patient tolerated treatment well Patient left: in chair;with call bell/phone within  reach;with family/visitor present Nurse Communication: Mobility status         Time: 1644-1700 PT Time Calculation (min) (ACUTE ONLY): 16 min   Charges:   PT Evaluation $PT Eval Low Complexity: 1 Procedure     PT G CodesClaretha Cooper 11/27/2015, 6:18 PM Tresa Endo PT [phone]

## 2015-11-27 NOTE — Progress Notes (Signed)
Pt placed on CPAP qhs.  Pt using home equipment.  Machine shows no damage and powers on completely.  Cord has no cuts or frays and does not spark when plugged in.  Humidifier filled with sterile water.   Pt resting stable and comfortable.

## 2015-11-27 NOTE — Progress Notes (Signed)
Utilization review completed.  

## 2015-11-27 NOTE — Interval H&P Note (Signed)
History and Physical Interval Note:  11/27/2015 7:10 AM  [name]  has presented today for surgery, with the diagnosis of left failed total knee due to a septic loosening  The various methods of treatment have been discussed with the patient and family. After consideration of risks, benefits and other options for treatment, the patient has consented to  Procedure(s): LEFT TOTAL KNEE REVISION (Left) as a surgical intervention .  The patient's history has been reviewed, patient examined, no change in status, stable for surgery.  I have reviewed the patient's chart and labs.  Questions were answered to the patient's satisfaction.     Mauri Pole

## 2015-11-27 NOTE — Anesthesia Preprocedure Evaluation (Addendum)
Anesthesia Evaluation  Patient identified by MRN, date of birth, ID band Patient awake    Reviewed: Allergy & Precautions, NPO status , Patient's Chart, lab work & pertinent test results, reviewed documented beta blocker date and time   Airway Mallampati: II  TM Distance: >3 FB Neck ROM: Full    Dental  (+) Teeth Intact   Pulmonary sleep apnea , former smoker,    Pulmonary exam normal breath sounds clear to auscultation       Cardiovascular hypertension, Pt. on medications Normal cardiovascular exam Rhythm:Regular Rate:Normal     Neuro/Psych negative neurological ROS  negative psych ROS   GI/Hepatic negative GI ROS, Neg liver ROS,   Endo/Other  negative endocrine ROS  Renal/GU negative Renal ROS  negative genitourinary   Musculoskeletal  (+) Arthritis , Failed Left TKR DDD lumbar spine   Abdominal   Peds  Hematology  (+) anemia ,   Anesthesia Other Findings   Reproductive/Obstetrics negative OB ROS                            Anesthesia Physical Anesthesia Plan  ASA: II  Anesthesia Plan: General   Post-op Pain Management:    Induction:   Airway Management Planned: Oral ETT  Additional Equipment:   Intra-op Plan:   Post-operative Plan: Extubation in OR  Informed Consent: I have reviewed the patients History and Physical, chart, labs and discussed the procedure including the risks, benefits and alternatives for the proposed anesthesia with the patient or authorized representative who has indicated his/her understanding and acceptance.   Dental advisory given  Plan Discussed with: Anesthesiologist, CRNA and Surgeon  Anesthesia Plan Comments:        Anesthesia Quick Evaluation

## 2015-11-27 NOTE — Brief Op Note (Signed)
11/27/2015  10:31 AM  PATIENT:  [name]  65 y.o. 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 PROCEDURE:  Procedure(s): resection left total knee placement of spacers (Left)  SURGEON:  Surgeon(s) and Role:    * Paralee Cancel, MD - Primary  PHYSICIAN ASSISTANT: Danae Orleans, PA-C  ANESTHESIA:   spinal  EBL:  Total I/O In: 2660 [I.V.:2550; IV Piggyback:110] Out: 450 [Urine:400; Blood:50]  BLOOD ADMINISTERED:none  DRAINS: none   LOCAL MEDICATIONS USED:  MARCAINE     SPECIMEN:  Source of Specimen:  left knee synovial fluid  DISPOSITION OF SPECIMEN:  PATHOLOGY  COUNTS:  YES  TOURNIQUET:   Total Tourniquet Time Documented: Thigh (Left) - 56 minutes Total: Thigh (Left) - 56 minutes  at 250 mmHg  DICTATION: .Other Dictation: Dictation Number [id number]  PLAN OF CARE: Admit to inpatient   PATIENT DISPOSITION:  PACU - hemodynamically stable.   Delay start of Pharmacological VTE agent (>24hrs) due to surgical blood loss or risk of bleeding: no

## 2015-11-27 NOTE — Progress Notes (Signed)
Peripherally Inserted Central Catheter/Midline Placement  The IV Nurse has discussed with the patient and/or persons authorized to consent for the patient, the purpose of this procedure and the potential benefits and risks involved with this procedure.  The benefits include less needle sticks, lab draws from the catheter and patient may be discharged home with the catheter.  Risks include, but not limited to, infection, bleeding, blood clot (thrombus formation), and puncture of an artery; nerve damage and irregular heat beat.  Alternatives to this procedure were also discussed.  PICC/Midline Placement Documentation  PICC Single Lumen 0000000 PICC Right Basilic 35 cm 2 cm (Active)  Indication for Insertion or Continuance of Line Home intravenous therapies (PICC only) 11/27/2015  2:00 PM  Exposed Catheter (cm) 2 cm 11/27/2015  2:00 PM  Dressing Change Due 12/04/15 11/27/2015  2:00 PM       Jule Economy Horton 11/27/2015, 2:41 PM

## 2015-11-27 NOTE — Progress Notes (Signed)
ANTIBIOTIC CONSULT NOTE - INITIAL  Pharmacy Consult for Vancomycin Indication: wound infection, L TKR  No Known Allergies  Patient Measurements: Height: 5\' 3"  (160 cm) Weight: 161 lb 12.6 oz (73.386 kg) IBW/kg (Calculated) : 52.4  Vital Signs: Temp: 98.9 F (37.2 C) (02/13 1125) Temp Source: Oral (02/13 0530) BP: 116/70 mmHg (02/13 1125) Pulse Rate: 77 (02/13 1125) Intake/Output from previous day:   Intake/Output from this shift: Total I/O In: 2960 [I.V.:2850; IV Piggyback:110] Out: 550 [Urine:500; Blood:50]  Labs: No results for input(s): WBC, HGB, PLT, LABCREA, CREATININE in the last 72 hours. Estimated Creatinine Clearance: 67.3 mL/min (by C-G formula based on Cr of 0.51). No results for input(s): VANCOTROUGH, VANCOPEAK, VANCORANDOM, GENTTROUGH, GENTPEAK, GENTRANDOM, TOBRATROUGH, TOBRAPEAK, TOBRARND, AMIKACINPEAK, AMIKACINTROU, AMIKACIN in the last 72 hours.   Microbiology: Recent Results (from the past 720 hour(s))  Surgical pcr screen     Status: None   Collection Time: 11/20/15  8:30 AM  Result Value Ref Range Status   MRSA, PCR NEGATIVE NEGATIVE Final   Staphylococcus aureus NEGATIVE NEGATIVE Final    Comment:        The Xpert SA Assay (FDA approved for NASAL specimens in patients over 19 years of age), is one component of a comprehensive surveillance program.  Test performance has been validated by [facility] for patients greater than or equal to 66 year old. It is not intended to diagnose infection nor to guide or monitor treatment.   Body fluid culture     Status: None (Preliminary result)   Collection Time: 11/27/15  8:27 AM  Result Value Ref Range Status   Specimen Description SYNOVIAL LEFT KNEE  Final   Special Requests NONE  Final   Gram Stain   Final    CYTOSPIN NO WBC SEEN NO ORGANISMS SEEN Gram Stain Report Called to,Read Back By and Verified With: MATHEW ROBBINS RN AT [phone] ON02.13.17 BY SHUEA    Culture PENDING  Incomplete   Report  Status PENDING  Incomplete   Medical History: Past Medical History  Diagnosis Date  . Hypertension   . Normal cardiac stress test 08-10-2004  . Bladder tumor     benign  . Blood transfusion   . Urgency of urination   . Arthritis KNEES AND HANDS  . DDD (degenerative disc disease), lumbar   . DDD (degenerative disc disease)   . Anxiety   . OSA on CPAP     uses cpap, pt does not know settings   Medications:  Scheduled:  . amLODipine  10 mg Oral Daily   And  . irbesartan  300 mg Oral Daily   And  . hydrochlorothiazide  25 mg Oral Daily  . [START ON 11/28/2015] aspirin EC  325 mg Oral BID  . celecoxib  200 mg Oral Q12H  . [START ON 11/28/2015] dexamethasone  10 mg Intravenous Once  . docusate sodium  100 mg Oral BID  . ferrous sulfate  325 mg Oral TID PC  . HYDROcodone-acetaminophen  1-2 tablet Oral Q4H  . HYDROmorphone      . polyethylene glycol  17 g Oral BID  . sertraline  50 mg Oral q morning - 10a   Anti-infectives    Start     Dose/Rate Route Frequency Ordered Stop   11/27/15 0832  tobramycin (NEBCIN) powder  Status:  Discontinued       As needed 11/27/15 0832 11/27/15 0954   11/27/15 0832  vancomycin (VANCOCIN) powder  Status:  Discontinued  As needed 11/27/15 0832 11/27/15 0954   11/27/15 0600  ceFAZolin (ANCEF) IVPB 2 g/50 mL premix     2 g 100 mL/hr over 30 Minutes Intravenous On call to O.R. 11/27/15 0600 11/27/15 0745     Assessment: 1 yoF with L total knee arthroplasty infection, for abx spacer placement 2/13, IV abx.  Begin Vancomycin IV, with Pharmacy dosing assistance.  Goal of Therapy:  Vancomycin trough level 15-20 mcg/ml  Plan:   Vancomycin 1gm q12 hr  Follow renal function, cultures, length of therapy  Vanc trough at steady state, anticipate 6 wk course abx  Minda Ditto PharmD Pager [phone] 11/27/2015, 11:44 AM

## 2015-11-27 NOTE — Transfer of Care (Signed)
Immediate Anesthesia Transfer of Care Note  Patient: [name]  Procedure(s) Performed: Procedure(s): resection left total knee placement of spacers (Left)  Patient Location: PACU  Anesthesia Type:General  Level of Consciousness: awake, sedated and responds to stimulation  Airway & Oxygen Therapy: Patient Spontanous Breathing and Patient connected to face mask oxygen  Post-op Assessment: Report given to RN and Post -op Vital signs reviewed and stable  Post vital signs: Reviewed and stable  Last Vitals:  Filed Vitals:   11/27/15 0530  BP: 120/58  Pulse: 84  Temp: 37 C  Resp: 18    Complications: No apparent anesthesia complications

## 2015-11-27 NOTE — Anesthesia Postprocedure Evaluation (Addendum)
Anesthesia Post Note  Patient: [name]  Procedure(s) Performed: Procedure(s) (LRB): resection left total knee placement of spacers (Left)  Patient location during evaluation: PACU Anesthesia Type: General Level of consciousness: awake and alert and oriented Pain management: pain level controlled Vital Signs Assessment: post-procedure vital signs reviewed and stable Respiratory status: spontaneous breathing, nonlabored ventilation, respiratory function stable and patient connected to nasal cannula oxygen Cardiovascular status: stable and blood pressure returned to baseline Postop Assessment: no signs of nausea or vomiting Anesthetic complications: no    Last Vitals:  Filed Vitals:   11/27/15 1030 11/27/15 1045  BP: 103/58 111/55  Pulse: 77 69  Temp:  36.8 C  Resp: 14 12    Last Pain:  Filed Vitals:   11/27/15 1055  PainSc: 7                  Gearld Kerstein A.

## 2015-11-27 NOTE — Op Note (Signed)
[name], [name]                 ACCOUNT NO.:  [id number]  MEDICAL RECORD NO.:  [id number]  LOCATION:  34                         FACILITY:  [facility]  PHYSICIAN:  Pietro Cassis. Alvan Dame, M.D.  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  POSTOPERATIVE DIAGNOSIS:  Failed left total knee arthroplasty, questionable septic versus aseptic failure.  FINDINGS:  Upon entry into the knee, she was noted to have a cloudy synovial fluid.  Her tissue has had a very abnormal appearance and making it worrisome for aseptic failure as opposed aseptic failure alone.  Her polyethylene did not appear to be excessively where, there was no evidence of any metal-on-metal articulation to explain any other source of her problem.  PROCEDURE:  Resection of left total knee arthroplasty.  Placement of an antibiotic spacer utilizing the three batches of cement mixed with 3 g of vancomycin and 3.6 of tobramycin.  SURGEON:  Pietro Cassis. Alvan Dame, M.D.  ASSISTANT:  Danae Orleans, PA-C.  Note that [name] was present for the entirety of the case for preoperative position, perioperative management of the operative extremity, general facilitation of the case and primary wound closure.  ANESTHESIA:  Spinal.  SPECIMENS:  Joint fluid was sent to Pathology for evaluation and gram- stain culture.  TOURNIQUET TIME:  56 minutes at 250 mmHg.  BLOOD LOSS:  Less than 200 mL once the tourniquet was let down.  DRAINS:  None.  INDICATIONS FOR PROCEDURE:  [name] is a pleasant 65 year old female, who has been sent and evaluated for a painful left total knee arthroplasty.  She presented after total knee arthroplasty was performed in 2008, by Dr. Latanya Maudlin.  She had a significant radiographic evidence of medial varus collapse of her knee.   She did not present with a concern for infection and thus workup was only consistent with bone scan that had been performed by Dr. Gladstone Lighter in 2015.  This would indicate a loosening of probably the tibial component with increased uptake in all 3 phases.  At the time of our evaluation, we scheduled for total knee revision surgery with the discussion of risks of infection, DVT, component failure, need for future revision surgery.  At that time, I did not raise the concerns about infection as a source of her loosening based on the duration out from her surgery and her presentation.  Consent was obtained for management of her knee problems.  PROCEDURE IN DETAIL:  The patient was brought to the operative theater. Once adequate anesthesia, preoperative antibiotics, Ancef administered as well as 1 g of tranexamic acid and 10 mg of Decadron, she was positioned supine on the OR table.  A left thigh tourniquet placed. Left foot was placed in DeMayo leg holder.  Left lower extremity was then prepped and draped in usual sterile fashion.  Time-out was performed identifying the patient, planned procedure, and extremity. Leg was exsanguinated.  Tourniquet elevated to 250 mmHg.  Her old incision had been demarcated and was excised.  Soft tissue planes created.  At this point, I made a median arthrotomy and I did not encounter clear synovial fluid.  The fluid instead was cloudy in appearance.  She has significant hypertrophic synovium with staining to it. I continued on with the case at this point by taking cultures as well as taking a syringe in removing the fluid, sent it off for cell count, gram- stain and culture.  At this point, became highly suspicious about the potential septic source of her failure.  Following initial exposure and synovectomy of this significant hypertrophic and stained synovial lining which appeared to be unhealthy in appearance, the tibial component was noted to be grossly  loose.  The femoral component was then removed based on the potential indication of an infection as I initially thought that perhaps had a salvage her femoral component.  It was noted to be a size 2.5 at femur and felt that it could downsize to a 2 any ways.  As I removed the femoral component, we encountered significant soften bone on the femur again with an overall unhealthy appearance.  Once this was removed, then the tibial component removed, I made a decision clinically at this point to treat this as an aseptic failure and to resect the implants, performed a debridement and place of antibiotic spacer.  While completing further debridements around the knee, we opened up a small femoral and small tibial mold from the DePuy system.  I then irrigated the canal with the canal brush irrigator and irrigated the knee with 3 liters of normal saline solution.  The cement was mixed.  The femoral mold was then held into place until the cement had fully cured and a cemented mold had been measured out and prefabbed out to be 15-17 mm in thickness.  Once the cement had cured on this first two batches of cement, the third batch of cement was mixed to cement the tibial component placed as well as placing of cement in the tibial defect.  It was noted to be quite significant on the medial side of the knee.  Once the cement had cured, the knee was re-irrigated with normal saline solution of another liter.  No Hemovac drain was placed.  The tourniquet was let down to make certain there was no bleeding and once this was done, the extensor mechanism was reapproximated using #1 PDS suture. The remainder of the wound was closed with 2-0 Vicryl and running 3-0 Monocryl.  She was then brought to the recovery room in stable condition with a knee immobilizer in place.  Findings were reviewed with her husband and I reviewed these findings with her.  These were unexpected. At this point, we will plan on  treating her as an infected case whether or not we shorten the duration of antibiotics based on intraoperative culture results and antibiotic per Infectious Disease recommendations. We do feel that this was the most prudent course for the condition of the knee that I saw.     Pietro Cassis Alvan Dame, M.D.    MDO/MEDQ  D:  11/27/2015  T:  11/27/2015  Job:  [id number]

## 2015-11-27 NOTE — Anesthesia Procedure Notes (Signed)
Procedure Name: Intubation Performed by: Sherian Maroon A Pre-anesthesia Checklist: Patient identified, Emergency Drugs available, Suction available, Patient being monitored and Timeout performed Patient Re-evaluated:Patient Re-evaluated prior to inductionOxygen Delivery Method: Circle system utilized Preoxygenation: Pre-oxygenation with 100% oxygen Intubation Type: IV induction Ventilation: Mask ventilation without difficulty Laryngoscope Size: Mac and 4 Grade View: Grade III Tube type: Oral Number of attempts: 1 Airway Equipment and Method: Bougie stylet Placement Confirmation: ETT inserted through vocal cords under direct vision,  positive ETCO2 and breath sounds checked- equal and bilateral Secured at: 21 cm Tube secured with: Tape Dental Injury: Teeth and Oropharynx as per pre-operative assessment

## 2015-11-28 DIAGNOSIS — T8454XA Infection and inflammatory reaction due to internal left knee prosthesis, initial encounter: Secondary | ICD-10-CM | POA: Diagnosis not present

## 2015-11-28 LAB — BASIC METABOLIC PANEL WITH GFR
Anion gap: 8 (ref 5–15)
BUN: 17 mg/dL (ref 6–20)
CO2: 26 mmol/L (ref 22–32)
Calcium: 8.5 mg/dL — ABNORMAL LOW (ref 8.9–10.3)
Chloride: 105 mmol/L (ref 101–111)
Creatinine, Ser: 0.69 mg/dL (ref 0.44–1.00)
GFR calc Af Amer: >60 mL/min (ref >60)
GFR calc non Af Amer: >60 mL/min (ref >60)
Glucose, Bld: 118 mg/dL — ABNORMAL HIGH (ref 65–99)
Potassium: 4 mmol/L (ref 3.5–5.1)
Sodium: 139 mmol/L (ref 135–145)

## 2015-11-28 LAB — CBC
HCT: 27.2 % — ABNORMAL LOW (ref 36.0–46.0)
Hemoglobin: 9.2 g/dL — ABNORMAL LOW (ref 12.0–15.0)
MCH: 30.1 pg (ref 26.0–34.0)
MCHC: 33.8 g/dL (ref 30.0–36.0)
MCV: 88.9 fL (ref 78.0–100.0)
Platelets: 164 K/uL (ref 150–400)
RBC: 3.06 MIL/uL — ABNORMAL LOW (ref 3.87–5.11)
RDW: 12.7 % (ref 11.5–15.5)
WBC: 8.7 K/uL (ref 4.0–10.5)

## 2015-11-28 MED ORDER — ZOLPIDEM TARTRATE 10 MG PO TABS
10.0000 mg | ORAL_TABLET | Freq: Every evening | ORAL | Status: DC | PRN
  Administered 2015-11-28: 10 mg via ORAL
  Filled 2015-11-28: qty 1

## 2015-11-28 NOTE — Care Management Note (Signed)
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 PROCEDURE: Procedure(s): resection left total knee placement of spacers (Left) Action/Plan: Discharge planning Expected Discharge Date:  11/29/15               Expected Discharge Plan:  [place]  In-House Referral:     Discharge planning Services  CM Consult  Post Acute Care Choice:    Choice offered to:  Patient  DME Arranged:  N/A DME Agency:  NA  HH Arranged:  PT HH Agency:  [place]  Status of Service:  Completed, signed off  Medicare Important Message Given:    Date Medicare IM Given:    Medicare IM give by:    Date Additional Medicare IM Given:    Additional Medicare Important Message give by:     If discussed at [place] of Stay Meetings, dates discussed:    Additional Comments: CM met with pt in room to offer choice of home health agency.  Pt chooses [facility] to render HHPT.  Pt has both rollling walker and 3n1 at home.  Referral given to [facility] rep, Tim (on unit).  No other CM needs were communicated. Dellie Catholic, RN 11/28/2015, 1:27 PM

## 2015-11-28 NOTE — Consult Note (Signed)
[place] for Infectious Disease    Date of Admission:  11/27/2015   Day 2 vancomycin        Reason for Consult: Probable left prosthetic knee infection    Referring Physician: Dr. Adriana Mccallum  Principal Problem:   Infection of prosthetic left knee joint [facility]) Active Problems:   Status post total left knee replacement   Left knee abx spacer   . amLODipine  10 mg Oral Daily   And  . irbesartan  300 mg Oral Daily   And  . hydrochlorothiazide  25 mg Oral Daily  . antiseptic oral rinse  7 mL Mouth Rinse q12n4p  . aspirin EC  325 mg Oral BID  . celecoxib  200 mg Oral Q12H  . chlorhexidine  15 mL Mouth Rinse BID  . docusate sodium  100 mg Oral BID  . ferrous sulfate  325 mg Oral TID PC  . HYDROcodone-acetaminophen  1-2 tablet Oral Q4H  . polyethylene glycol  17 g Oral BID  . sertraline  50 mg Oral q morning - 10a  . vancomycin  1,000 mg Intravenous Q12H    Recommendations: 1. Continue vancomycin pending final culture results 2. 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 I will continue vancomycin to cover the most common Staph pathogens pending final culture results.    HPI: [name] is a 65 y.o. female with degenerative joint disease who underwent left total knee arthroplasty in 2008. Over the past year she has had progressive pain in her left knee. A bone scan last May was compatible with loosening of the prosthesis. She had to delay surgery on her knee because she had surgery on her left foot recently. She was admitted yesterday with a plan to undergo joint replacement but at the time of surgery Dr. Alvan Dame encountered "cloudy fluid" so he perform resection arthroplasty with spacer placement. Synovial fluid white blood cell counts could not be obtained because specimen clotted. Operative Gram stain shows no organisms and cultures are pending. She last took an antibiotic a little over one month ago when  she had a routine dental cleaning.   Review of Systems: Review of Systems  Constitutional: Negative for fever, chills, weight loss, malaise/fatigue and diaphoresis.  HENT: Negative for sore throat.   Respiratory: Negative for cough, sputum production and shortness of breath.   Cardiovascular: Negative for chest pain.  Gastrointestinal: Negative for nausea, vomiting and diarrhea.  Genitourinary: Negative for dysuria.  Musculoskeletal: Positive for joint pain.  Skin: Negative for rash.  Neurological: Negative for focal weakness and headaches.    Past Medical History  Diagnosis Date  . Hypertension   . Normal cardiac stress test 08-10-2004  . Bladder tumor     benign  . Blood transfusion   . Urgency of urination   . Arthritis KNEES AND HANDS  . DDD (degenerative disc disease), lumbar   . DDD (degenerative disc disease)   . Anxiety   . OSA on CPAP     uses cpap, pt does not know settings    Social History  Substance Use Topics  . Smoking status: Former Smoker -- 0.25 packs/day for 50 years    Types: Cigarettes    Quit date: 06/25/1980  . Smokeless tobacco: Never Used  . Alcohol Use: 0.6 oz/week    1 Glasses of wine per week     Comment: occasional  Family History  Problem Relation Age of Onset  . Cancer Mother     colon  . Cancer Father     lung   No Known Allergies  OBJECTIVE: Blood pressure 107/55, pulse 72, temperature 98.3 F (36.8 C), temperature source Oral, resp. rate 16, height 5\' 3"  (1.6 m), weight 161 lb 12.6 oz (73.386 kg), SpO2 100 %.  Physical Exam  Constitutional: She is oriented to person, place, and time.  She is alert and in good spirits.  Eyes: Conjunctivae are normal.  Cardiovascular: Normal rate and regular rhythm.   No murmur heard. Pulmonary/Chest: Breath sounds normal.  Abdominal: Soft. There is no tenderness.  Musculoskeletal:  Her left leg is in a soft brace.  Neurological: She is alert and oriented to person, place, and time.    Skin: No rash noted.  New right arm PICC.  Psychiatric: Mood and affect normal.    Lab Results Lab Results  Component Value Date   WBC 8.7 11/28/2015   HGB 9.2* 11/28/2015   HCT 27.2* 11/28/2015   MCV 88.9 11/28/2015   PLT 164 11/28/2015    Lab Results  Component Value Date   CREATININE 0.69 11/28/2015   BUN 17 11/28/2015   NA 139 11/28/2015   K 4.0 11/28/2015   CL 105 11/28/2015   CO2 26 11/28/2015    Lab Results  Component Value Date   ALT 35 11/15/2013   AST 22 11/15/2013   ALKPHOS 102 11/15/2013   BILITOT <0.2* 11/15/2013     Microbiology: Recent Results (from the past 240 hour(s))  Surgical pcr screen     Status: None   Collection Time: 11/20/15  8:30 AM  Result Value Ref Range Status   MRSA, PCR NEGATIVE NEGATIVE Final   Staphylococcus aureus NEGATIVE NEGATIVE Final    Comment:        The Xpert SA Assay (FDA approved for NASAL specimens in patients over 88 years of age), is one component of a comprehensive surveillance program.  Test performance has been validated by [facility] for patients greater than or equal to 18 year old. It is not intended to diagnose infection nor to guide or monitor treatment.   Body fluid culture     Status: None (Preliminary result)   Collection Time: 11/27/15  8:27 AM  Result Value Ref Range Status   Specimen Description SYNOVIAL LEFT KNEE  Final   Special Requests NONE  Final   Gram Stain   Final    CYTOSPIN NO WBC SEEN NO ORGANISMS SEEN Gram Stain Report Called to,Read Back By and Verified With: MATHEW ROBBINS RN AT [phone] ON02.13.17 BY SHUEA    Culture   Final    NO GROWTH < 24 HOURS Performed at [facility]    Report Status PENDING  Incomplete    Taylor Bickers, MD [place] [facility] [phone] pager   [phone] cell 11/28/2015, 2:49 PM

## 2015-11-28 NOTE — Progress Notes (Signed)
Physical Therapy Treatment Patient Details Name: [name] MRN: [id number] DOB: [birth year] Today's Date: 11/28/2015    History of Present Illness s/p failed L TKA, placement of antibiotic spacer 11/27/15    PT Comments    POD # 1 am session Pt OOB in recliner with spouse in room.  Pt aware she is PWB and knowledgeable to wear KI at all times.  Assisted with amb in hallway then performed some TE's followed by ICE.    Follow Up Recommendations  Home health PT;Supervision/Assistance - 24 hour     Equipment Recommendations  None recommended by PT    Recommendations for Other Services       Precautions / Restrictions Precautions Precautions: Knee Precaution Comments: pt aware to KI all times Required Braces or Orthoses: Knee Immobilizer - Left Knee Immobilizer - Left: On at all times Restrictions Weight Bearing Restrictions: Yes LLE Weight Bearing: Partial weight bearing LLE Partial Weight Bearing Percentage or Pounds: 25-50% Other Position/Activity Restrictions: pt aware she is PWB    Mobility  Bed Mobility               General bed mobility comments: pt OOB in recliner  Transfers Overall transfer level: Needs assistance Equipment used: Rolling walker (2 wheeled) Transfers: Sit to/from Stand Sit to Stand: Supervision         General transfer comment: cues  for PWB and increased time  Ambulation/Gait Ambulation/Gait assistance: Min guard;Min assist Ambulation Distance (Feet): 45 Feet Assistive device: Rolling walker (2 wheeled) Gait Pattern/deviations: Step-to pattern;Decreased stance time - left Gait velocity: increased time   General Gait Details: one VC safety with turns and PWB   Stairs            Wheelchair Mobility    Modified Rankin (Stroke Patients Only)       Balance                                    Cognition Arousal/Alertness: Awake/alert Behavior During Therapy: WFL for tasks assessed/performed Overall  Cognitive Status: Within Functional Limits for tasks assessed                      Exercises  10 reps AP, knee presses, SLR, ABd    General Comments        Pertinent Vitals/Pain Pain Assessment: 0-10 Pain Score: 5  Pain Location: L knee Pain Descriptors / Indicators: Sore Pain Intervention(s): Limited activity within patient's tolerance;Monitored during session;Repositioned;Ice applied    Home Living Family/patient expects to be discharged to:: Private residence Living Arrangements: Spouse/significant other Available Help at Discharge: Family Type of Home: House Home Access: Stairs to enter Entrance Stairs-Rails: Can reach both Home Layout: One level Home Equipment: Environmental consultant - 2 wheels;Cane - single point;Crutches;Bedside commode;Shower seat      Prior Function Level of Independence: Independent          PT Goals (current goals can now be found in the care plan section) Acute Rehab PT Goals Patient Stated Goal: to go home Progress towards PT goals: Progressing toward goals    Frequency  7X/week    PT Plan Current plan remains appropriate    Co-evaluation             End of Session Equipment Utilized During Treatment: Left knee immobilizer Activity Tolerance: Patient tolerated treatment well Patient left: in chair;with call bell/phone within reach;with family/visitor present  Time: 1035-1100 PT Time Calculation (min) (ACUTE ONLY): 25 min  Charges:  $Gait Training: 8-22 mins $Therapeutic Exercise: 8-22 mins                    G Codes:      Rica Koyanagi  PTA [facility]  Acute  Rehab Pager      [phone]

## 2015-11-28 NOTE — Progress Notes (Signed)
Occupational Therapy Evaluation Patient Details Name: [name] MRN: [id number] DOB: [birth year] Today's Date: 11/28/2015    History of Present Illness s/p failed L TKA, placement of antibiotic spacer 11/27/15   Clinical Impression   Patient presents to OT with decreased ADL independence s/p above procedure. Will benefit from skilled OT to maximize function and to facilitate safe discharge. OT will follow.    Follow Up Recommendations  No OT follow up;Supervision - Intermittent    Equipment Recommendations  None recommended by OT    Recommendations for Other Services       Precautions / Restrictions Precautions Precautions: Knee Required Braces or Orthoses: Knee Immobilizer - Left Knee Immobilizer - Left: On at all times Restrictions Weight Bearing Restrictions: Yes LLE Weight Bearing: Partial weight bearing LLE Partial Weight Bearing Percentage or Pounds: 25-50%      Mobility Bed Mobility               General bed mobility comments: up in chair  Transfers Overall transfer level: Needs assistance Equipment used: Rolling walker (2 wheeled) Transfers: Sit to/from Stand Sit to Stand: Supervision              Balance                                            ADL Overall ADL's : Needs assistance/impaired Eating/Feeding: Independent   Grooming: Wash/dry hands;Supervision/safety;Standing   Upper Body Bathing: Set up;Sitting   Lower Body Bathing: Minimal assistance;Sit to/from stand   Upper Body Dressing : Set up;Sitting   Lower Body Dressing: Minimal assistance;Sit to/from stand   Toilet Transfer: Supervision/safety;Ambulation;BSC;RW   Toileting- Clothing Manipulation and Hygiene: Supervision/safety;Sit to/from stand   Tub/ Shower Transfer: Walk-in shower;Supervision/safety;Ambulation;Shower seat;Rolling walker   Functional mobility during ADLs: Supervision/safety;Rolling walker General ADL Comments: Patient educated on LB  dressing techniques with reacher. Also has family who can assist. Patient educated on walk-in shower transfer and practiced it without difficulty. Patient also completed toileting task during session. She asked OT to follow up with her tomorrow to answer questions.     Vision     Perception     Praxis      Pertinent Vitals/Pain Pain Assessment: 0-10 Pain Score: 5  Pain Location: L knee Pain Descriptors / Indicators: Sore Pain Intervention(s): Limited activity within patient's tolerance;Monitored during session;Repositioned;Ice applied     Hand Dominance Right   Extremity/Trunk Assessment Upper Extremity Assessment Upper Extremity Assessment: Overall WFL for tasks assessed   Lower Extremity Assessment Lower Extremity Assessment: Defer to PT evaluation   Cervical / Trunk Assessment Cervical / Trunk Assessment: Normal   Communication Communication Communication: No difficulties   Cognition Arousal/Alertness: Awake/alert Behavior During Therapy: WFL for tasks assessed/performed Overall Cognitive Status: Within Functional Limits for tasks assessed                     General Comments       Exercises       Shoulder Instructions      Home Living Family/patient expects to be discharged to:: Private residence Living Arrangements: Spouse/significant other Available Help at Discharge: Family Type of Home: House Home Access: Stairs to enter Technical brewer of Steps: 3 Entrance Stairs-Rails: Can reach both Home Layout: One level     Bathroom Shower/Tub: Occupational psychologist: Handicapped height Bathroom Accessibility: Yes   Home  Equipment: Gilford Rile - 2 wheels;Cane - single point;Crutches;Bedside commode;Shower seat          Prior Functioning/Environment Level of Independence: Independent             OT Diagnosis: Acute pain   OT Problem List: Decreased strength;Decreased knowledge of use of DME or AE;Decreased knowledge of  precautions;Pain   OT Treatment/Interventions: Self-care/ADL training;DME and/or AE instruction;Therapeutic activities;Patient/family education    OT Goals(Current goals can be found in the care plan section) Acute Rehab OT Goals Patient Stated Goal: to go home OT Goal Formulation: With patient Time For Goal Achievement: 12/12/15 Potential to Achieve Goals: Good  OT Frequency: Min 2X/week   Barriers to D/C:            Co-evaluation              End of Session Equipment Utilized During Treatment: Rolling walker;Left knee immobilizer  Activity Tolerance: Patient tolerated treatment well Patient left: in chair;with call bell/phone within reach   Time: [id number] OT Time Calculation (min): 19 min Charges:  OT General Charges $OT Visit: 1 Procedure OT Evaluation $OT Eval Low Complexity: 1 Procedure G-Codes:    Ivon Roedel A 12-21-15, 11:59 AM

## 2015-11-28 NOTE — Progress Notes (Signed)
Pt stated that she will self administer her home CPAP when ready for bed.  Pt to notify RT if any assistance is required throughout the night.  RT to monitor and assess as needed.

## 2015-11-28 NOTE — Progress Notes (Signed)
Physical Therapy Treatment Patient Details Name: [name] MRN: [id number] DOB: 10/09/1951 Today's Date: 11/28/2015    History of Present Illness s/p failed L TKA, placement of antibiotic spacer 11/27/15    PT Comments    POD # 1 pm session. Assisted with amb in hallway with walker and PWB then practiced stairs twice with spouse.  Returned to room then assisted back to bed. Pt plans to D/C to home tomorrow.   Follow Up Recommendations  Home health PT;Supervision/Assistance - 24 hour     Equipment Recommendations  None recommended by PT    Recommendations for Other Services       Precautions / Restrictions Precautions Precautions: Knee Precaution Comments: pt aware to KI all times Required Braces or Orthoses: Knee Immobilizer - Left Knee Immobilizer - Left: On at all times Restrictions Weight Bearing Restrictions: Yes LLE Weight Bearing: Partial weight bearing LLE Partial Weight Bearing Percentage or Pounds: 25-50% Other Position/Activity Restrictions: pt aware she is PWB    Mobility  Bed Mobility Overal bed mobility: Needs Assistance Bed Mobility: Sit to Supine           General bed mobility comments: assisted back to bed MinGuard Assist  Transfers Overall transfer level: Needs assistance Equipment used: Rolling walker (2 wheeled) Transfers: Sit to/from Stand Sit to Stand: Supervision         General transfer comment: cues  for PWB and increased time  Ambulation/Gait Ambulation/Gait assistance: Min guard;Min assist Ambulation Distance (Feet): 45 Feet Assistive device: Rolling walker (2 wheeled) Gait Pattern/deviations: Step-to pattern;Decreased stance time - left Gait velocity: increased time   General Gait Details: one VC safety with turns and PWB   Stairs Stairs: Yes Stairs assistance: Min assist Stair Management: One rail Right;Step to pattern;Forwards Number of Stairs: 2 General stair comments: with spouse assist and maintain  PWB  Wheelchair Mobility    Modified Rankin (Stroke Patients Only)       Balance                                    Cognition Arousal/Alertness: Awake/alert Behavior During Therapy: WFL for tasks assessed/performed Overall Cognitive Status: Within Functional Limits for tasks assessed                      Exercises      General Comments        Pertinent Vitals/Pain Pain Assessment: 0-10 Pain Score: 5  Pain Location: L knee Pain Descriptors / Indicators: Sore Pain Intervention(s): Limited activity within patient's tolerance;Monitored during session;Repositioned;Ice applied    Home Living Family/patient expects to be discharged to:: Private residence Living Arrangements: Spouse/significant other Available Help at Discharge: Family Type of Home: House Home Access: Stairs to enter Entrance Stairs-Rails: Can reach both Home Layout: One level Home Equipment: Environmental consultant - 2 wheels;Cane - single point;Crutches;Bedside commode;Shower seat      Prior Function Level of Independence: Independent          PT Goals (current goals can now be found in the care plan section) Acute Rehab PT Goals Patient Stated Goal: to go home Progress towards PT goals: Progressing toward goals    Frequency  7X/week    PT Plan Current plan remains appropriate    Co-evaluation             End of Session Equipment Utilized During Treatment: Left knee immobilizer Activity Tolerance: Patient tolerated treatment  well Patient left: in chair;with call bell/phone within reach;with family/visitor present     Time: 1430-1445 PT Time Calculation (min) (ACUTE ONLY): 15 min  Charges:  $Gait Training: 8-22 mins $Therapeutic Exercise: 8-22 mins                    G Codes:      Nathanial Rancher 12/20/2015, 2:49 PM

## 2015-11-29 DIAGNOSIS — M00862 Arthritis due to other bacteria, left knee: Secondary | ICD-10-CM | POA: Diagnosis not present

## 2015-11-29 DIAGNOSIS — B9689 Other specified bacterial agents as the cause of diseases classified elsewhere: Secondary | ICD-10-CM | POA: Diagnosis not present

## 2015-11-29 DIAGNOSIS — E663 Overweight: Secondary | ICD-10-CM | POA: Diagnosis present

## 2015-11-29 DIAGNOSIS — T8454XA Infection and inflammatory reaction due to internal left knee prosthesis, initial encounter: Secondary | ICD-10-CM | POA: Diagnosis not present

## 2015-11-29 LAB — CBC
HCT: 26 % — ABNORMAL LOW (ref 36.0–46.0)
HEMOGLOBIN: 8.4 g/dL — AB (ref 12.0–15.0)
MCH: 29.6 pg (ref 26.0–34.0)
MCHC: 32.3 g/dL (ref 30.0–36.0)
MCV: 91.5 fL (ref 78.0–100.0)
Platelets: 165 10*3/uL (ref 150–400)
RBC: 2.84 MIL/uL — ABNORMAL LOW (ref 3.87–5.11)
RDW: 12.8 % (ref 11.5–15.5)
WBC: 7.1 10*3/uL (ref 4.0–10.5)

## 2015-11-29 LAB — BASIC METABOLIC PANEL
Anion gap: 8 (ref 5–15)
BUN: 20 mg/dL (ref 6–20)
CHLORIDE: 104 mmol/L (ref 101–111)
CO2: 27 mmol/L (ref 22–32)
CREATININE: 0.59 mg/dL (ref 0.44–1.00)
Calcium: 8.7 mg/dL — ABNORMAL LOW (ref 8.9–10.3)
GFR calc Af Amer: >60 mL/min (ref >60)
GFR calc non Af Amer: >60 mL/min (ref >60)
GLUCOSE: 128 mg/dL — AB (ref 65–99)
Potassium: 3.9 mmol/L (ref 3.5–5.1)
SODIUM: 139 mmol/L (ref 135–145)

## 2015-11-29 MED ORDER — ACETAMINOPHEN 325 MG PO TABS: 325.0000 mg | ORAL_TABLET | Freq: Four times a day (QID) | ORAL | Status: DC | PRN

## 2015-11-29 MED ORDER — VANCOMYCIN HCL IN DEXTROSE 1-5 GM/200ML-% IV SOLN: 1000.0000 mg | Freq: Two times a day (BID) | INTRAVENOUS | Status: DC

## 2015-11-29 MED ORDER — OXYCODONE HCL 5 MG PO TABS
5.0000 mg | ORAL_TABLET | ORAL | Status: DC
Start: 2015-11-29 — End: 2016-01-17

## 2015-11-29 MED ORDER — HEPARIN SOD (PORK) LOCK FLUSH 100 UNIT/ML IV SOLN
250.0000 [IU] | INTRAVENOUS | Status: AC | PRN
  Administered 2015-11-29: 250 [IU]

## 2015-11-29 MED ORDER — DEXTROSE 5 % IV SOLN: 1.0000 g | INTRAVENOUS | Status: DC

## 2015-11-29 MED ORDER — DOCUSATE SODIUM 100 MG PO CAPS: 100.0000 mg | ORAL_CAPSULE | Freq: Two times a day (BID) | ORAL | Status: DC

## 2015-11-29 MED ORDER — POLYETHYLENE GLYCOL 3350 17 G PO PACK: 17.0000 g | PACK | Freq: Two times a day (BID) | ORAL | Status: DC

## 2015-11-29 MED ORDER — METHOCARBAMOL 500 MG PO TABS: 500.0000 mg | ORAL_TABLET | Freq: Four times a day (QID) | ORAL | Status: DC | PRN

## 2015-11-29 MED ORDER — OXYCODONE HCL 5 MG PO TABS
5.0000 mg | ORAL_TABLET | ORAL | Status: DC
  Administered 2015-11-29 (×2): 15 mg via ORAL
  Filled 2015-11-29 (×2): qty 3

## 2015-11-29 MED ORDER — DEXTROSE 5 % IV SOLN
1.0000 g | INTRAVENOUS | Status: DC
  Administered 2015-11-29: 1 g via INTRAVENOUS
  Filled 2015-11-29: qty 10

## 2015-11-29 MED ORDER — ASPIRIN 325 MG PO TBEC: 325.0000 mg | DELAYED_RELEASE_TABLET | Freq: Two times a day (BID) | ORAL | Status: AC

## 2015-11-29 MED ORDER — ACETAMINOPHEN 325 MG PO TABS
325.0000 mg | ORAL_TABLET | Freq: Four times a day (QID) | ORAL | Status: DC | PRN
Start: 2015-11-29 — End: 2016-01-17

## 2015-11-29 NOTE — Progress Notes (Signed)
Physical Therapy Treatment Patient Details Name: VERNICIA ROCKWOOD MRN: [id number] DOB: [birth year] Today's Date: 11/29/2015    History of Present Illness s/p failed L TKA, placement of antibiotic spacer 11/27/15    PT Comments    Pt eager to D/C to home.  Able to self rise and amb a functional distance.  Pt aware she is PWB and to wear KI at all times.   Follow Up Recommendations  Home health PT;Supervision/Assistance - 24 hour     Equipment Recommendations  None recommended by PT    Recommendations for Other Services       Precautions / Restrictions Precautions Precautions: Knee Precaution Comments: pt aware to KI all times Required Braces or Orthoses: Knee Immobilizer - Left Knee Immobilizer - Left: On at all times Restrictions Weight Bearing Restrictions: Yes LLE Weight Bearing: Partial weight bearing LLE Partial Weight Bearing Percentage or Pounds: 25-50% Other Position/Activity Restrictions: pt aware she is PWB    Mobility  Bed Mobility               General bed mobility comments: Pt OOB in recliner  Transfers Overall transfer level: Needs assistance Equipment used: Rolling walker (2 wheeled) Transfers: Sit to/from Stand Sit to Stand: Supervision         General transfer comment: good safety cognition  Ambulation/Gait Ambulation/Gait assistance: Supervision Ambulation Distance (Feet): 85 Feet Assistive device: Rolling walker (2 wheeled) Gait Pattern/deviations: Step-to pattern;Decreased stance time - left     General Gait Details: one VC safety with turns and PWB   Stairs            Wheelchair Mobility    Modified Rankin (Stroke Patients Only)       Balance                                    Cognition Arousal/Alertness: Awake/alert Behavior During Therapy: WFL for tasks assessed/performed Overall Cognitive Status: Within Functional Limits for tasks assessed                      Exercises      General  Comments        Pertinent Vitals/Pain Pain Assessment: 0-10 Pain Score: 3  Pain Location: L knee Pain Descriptors / Indicators: Sore;Tender Pain Intervention(s): Monitored during session;Repositioned;Premedicated before session    Home Living                      Prior Function            PT Goals (current goals can now be found in the care plan section) Acute Rehab PT Goals Patient Stated Goal: to go home Progress towards PT goals: Progressing toward goals    Frequency  7X/week    PT Plan Current plan remains appropriate    Co-evaluation             End of Session Equipment Utilized During Treatment: Left knee immobilizer Activity Tolerance: Patient tolerated treatment well Patient left: in chair;with call bell/phone within reach;with family/visitor present     Time: 1100-1125 PT Time Calculation (min) (ACUTE ONLY): 25 min  Charges:  $Gait Training: 8-22 mins $Therapeutic Exercise: 8-22 mins                    G Codes:      Rica Koyanagi  PTA [facility]  Acute  Rehab Pager  [phone]  

## 2015-11-29 NOTE — Progress Notes (Signed)
     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  Discussed changing medication.  No events throughout the night. Discussed what took place during surgery as well as the subsequent plan procedure.  Ready to be discharged home if ID is good with the plan.  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 Follow up with OLIN,Angelly Spearing D in 2 weeks.  Contact information:  [facility] [address] [place] [id number]    Overweight (BMI 25-29.9) Estimated body mass index is 28.67 kg/(m^2) as calculated from the following:   Height as of this encounter: 5\' 3"  (1.6 m).   Weight as of this encounter: 73.386 kg (161 lb 12.6 oz). Patient also counseled that weight may inhibit the healing process Patient counseled that losing weight will help with future health issues         [place] Pugh. Constanza Mincy   PAC  11/29/2015, 9:25 AM

## 2015-11-29 NOTE — Progress Notes (Signed)
Occupational Therapy Treatment Patient Details Name: DENIS KOPPEL MRN: [id number] DOB: 1951-08-11 Today's Date: 11/29/2015    History of present illness s/p failed L TKA, placement of antibiotic spacer 11/27/15   OT comments  All OT goals met; no further OT needs. OT will sign off.  Follow Up Recommendations  No OT follow up;Supervision - Intermittent    Equipment Recommendations  None recommended by OT    Recommendations for Other Services      Precautions / Restrictions Precautions Precautions: Knee Required Braces or Orthoses: Knee Immobilizer - Left Knee Immobilizer - Left: On at all times       Mobility Bed Mobility                  Transfers                      Balance                                   ADL                                         General ADL Comments: Patient seen per her request today to answer questions. All questions answered. Verbal review of LB dressing techniques, shower transfer. Patient states she may be going home today. No further OT needs. Will sign off.      Vision                     Perception     Praxis      Cognition   Behavior During Therapy: WFL for tasks assessed/performed Overall Cognitive Status: Within Functional Limits for tasks assessed                       Extremity/Trunk Assessment               Exercises     Shoulder Instructions       General Comments      Pertinent Vitals/ Pain       Pain Assessment: No/denies pain  Home Living                                          Prior Functioning/Environment              Frequency       Progress Toward Goals  OT Goals(current goals can now be found in the care plan section)  Progress towards OT goals: Goals met/education completed, patient discharged from OT  Acute Rehab OT Goals Patient Stated Goal: to go home  Plan All goals met and education  completed, patient discharged from OT services    Co-evaluation                 End of Session     Activity Tolerance Patient tolerated treatment well   Patient Left in bed;with call bell/phone within reach;with bed alarm set   Nurse Communication          Time: 9038-3338 OT Time Calculation (min): 10 min  Charges: OT Treatments $Self Care/Home Management : 8-22 mins  Alizay Bronkema A 11/29/2015, 1:11 PM

## 2015-11-29 NOTE — Care Management Note (Signed)
Case Management Note  Patient Details  Name: [name] MRN: [id number] Date of Birth: Nov 03, 1950  Subjective/Objective:                  resection left total knee placement of spacers (Left)  Action/Plan: discharge planning Expected Discharge Date:                  Expected Discharge Plan:  [place]  In-House Referral:     Discharge planning Services  CM Consult  Post Acute Care Choice:    Choice offered to:  Patient  DME Arranged:  IV pump/equipment DME Agency:  [place]:  PT, RN [facility] Agency:  [place]  Status of Service:  Completed, signed off  Medicare Important Message Given:    Date Medicare IM Given:    Medicare IM give by:    Date Additional Medicare IM Given:    Additional Medicare Important Message give by:     If discussed at [place] of Stay Meetings, dates discussed:    Additional Comments: CM spoke with [employer] [employer] rep, Tim who states Dispensing optician for RN for IV admin. And relinquishes account to [facility] for HHPT/RN services.  CM notified [facility] and pt.  No other CM needs were communicated. Dellie Catholic, RN 11/29/2015, 11:45 AM

## 2015-11-29 NOTE — Progress Notes (Signed)
RN reviewed discharge instructions with patient and family. All questions answered.   Paperwork and prescriptions given.   NT rolled patient down in wheelchair with all belongings to patient car.

## 2015-11-29 NOTE — Progress Notes (Signed)
Pharmacy Antibiotic Note  [name] is a 65 y.o. female admitted on 11/27/2015 with Infection of prosthetic left knee joint   Pharmacy has been consulted for vancomycin dosing.  Plan: -Pt discharging today on vancomycin 1gm IV q12h -recommend obtaining a vanc trough in the next 1-2 days then at least once weekly thereafter or as clinically indicated -also recommend obtaining a Scr at least twice a week - continue ceftriaxone 1gm IV q24h  Height: 5\' 3"  (160 cm) Weight: 161 lb 12.6 oz (73.386 kg) IBW/kg (Calculated) : 52.4  Temp (24hrs), Avg:98.3 F (36.8 C), Min:97.9 F (36.6 C), Max:98.7 F (37.1 C)   Recent Labs Lab 11/28/15 0625 11/29/15 0425  WBC 8.7 7.1  CREATININE 0.69 0.59    Estimated Creatinine Clearance: 67.3 mL/min (by C-G formula based on Cr of 0.59).    No Known Allergies  Antimicrobials this admission: 2/13 vanc >> 2/15 ceftriaxone >>  Dose adjustments this admission: none  Microbiology results: 2/13 synovial left knee:   Thank you for allowing pharmacy to be a part of this patient's care.  Dolly Rias RPh 11/29/2015, 2:56 PM Pager [phone]

## 2015-11-29 NOTE — Progress Notes (Signed)
Patient ID: [name], female   DOB: Jan 08, 1951, 65 y.o.   MRN: [id number]         [facility] for [facility]ious Disease    Date of Admission:  11/27/2015           Day 3 vancomycin  Principal Problem:   Infection of prosthetic left knee joint (HCC) Active Problems:   Status post total left knee replacement   Left knee abx spacer   Overweight (BMI 25.0-29.9)   . amLODipine  10 mg Oral Daily   And  . irbesartan  300 mg Oral Daily   And  . hydrochlorothiazide  25 mg Oral Daily  . antiseptic oral rinse  7 mL Mouth Rinse q12n4p  . aspirin EC  325 mg Oral BID  . cefTRIAXone (ROCEPHIN)  IV  1 g Intravenous Q24H  . celecoxib  200 mg Oral Q12H  . chlorhexidine  15 mL Mouth Rinse BID  . docusate sodium  100 mg Oral BID  . ferrous sulfate  325 mg Oral TID PC  . oxyCODONE  5-15 mg Oral Q4H  . polyethylene glycol  17 g Oral BID  . sertraline  50 mg Oral q morning - 10a  . vancomycin  1,000 mg Intravenous Q12H    SUBJECTIVE: She is feeling better. She did well with physical therapy yesterday and her pain is under good control.  Review of Systems: Review of Systems  Constitutional: Negative for fever, chills and diaphoresis.  Musculoskeletal: Positive for joint pain.       Her left knee pain is under good control.    Past Medical History  Diagnosis Date  . Hypertension   . Normal cardiac stress test 08-10-2004  . Bladder tumor     benign  . Blood transfusion   . Urgency of urination   . Arthritis KNEES AND HANDS  . DDD (degenerative disc disease), lumbar   . DDD (degenerative disc disease)   . Anxiety   . OSA on CPAP     uses cpap, pt does not know settings    Social History  Substance Use Topics  . Smoking status: Former Smoker -- 0.25 packs/day for 50 years    Types: Cigarettes    Quit date: 06/25/1980  . Smokeless tobacco: Never Used  . Alcohol Use: 0.6 oz/week    1 Glasses of wine per week     Comment: occasional    Family History  Problem Relation  Age of Onset  . Cancer Mother     colon  . Cancer Father     lung   No Known Allergies  OBJECTIVE: Filed Vitals:   11/28/15 1400 11/28/15 2100 11/29/15 0300 11/29/15 0944  BP: 106/48 120/63 116/64 117/54  Pulse: 75 77 75   Temp: 97.8 F (36.6 C) 98.7 F (37.1 C) 97.9 F (36.6 C)   TempSrc: Oral Oral Oral   Resp: 16 16 16    Height:      Weight:      SpO2: 96% 97% 97%    Body mass index is 28.67 kg/(m^2).  Physical Exam  Constitutional:  She is smiling and in good spirits.  Musculoskeletal:  She has a soft brace on her left leg.    Lab Results Lab Results  Component Value Date   WBC 7.1 11/29/2015   HGB 8.4* 11/29/2015   HCT 26.0* 11/29/2015   MCV 91.5 11/29/2015   PLT 165 11/29/2015    Lab Results  Component Value Date  CREATININE 0.59 11/29/2015   BUN 20 11/29/2015   NA 139 11/29/2015   K 3.9 11/29/2015   CL 104 11/29/2015   CO2 27 11/29/2015    Lab Results  Component Value Date   ALT 35 11/15/2013   AST 22 11/15/2013   ALKPHOS 102 11/15/2013   BILITOT <0.2* 11/15/2013     Microbiology: Recent Results (from the past 240 hour(s))  Surgical pcr screen     Status: None   Collection Time: 11/20/15  8:30 AM  Result Value Ref Range Status   MRSA, PCR NEGATIVE NEGATIVE Final   Staphylococcus aureus NEGATIVE NEGATIVE Final    Comment:        The Xpert SA Assay (FDA approved for NASAL specimens in patients over 47 years of age), is one component of a comprehensive surveillance program.  Test performance has been validated by [facility] for patients greater than or equal to 28 year old. It is not intended to diagnose infection nor to guide or monitor treatment.   Body fluid culture     Status: None (Preliminary result)   Collection Time: 11/27/15  8:27 AM  Result Value Ref Range Status   Specimen Description SYNOVIAL LEFT KNEE  Final   Special Requests NONE  Final   Gram Stain   Final    CYTOSPIN NO WBC SEEN NO ORGANISMS SEEN Gram Stain  Report Called to,Read Back By and Verified With: MATHEW ROBBINS RN AT [phone] [id number] BY SHUEA    Culture   Final    NO GROWTH 2 DAYS Performed at [facility]    Report Status PENDING  Incomplete     ASSESSMENT: Her operative cultures are negative at 48 hours. I will continue vancomycin and add ceftriaxone for gram-negative rod coverage. I will check final cultures and make any adjustments in her antibiotic therapy as needed. He will need antibiotic therapy for 6 weeks through 01/08/2016. I will arrange follow-up in my clinic in one month.  PLAN: 1. Discharge home today on IV vancomycin and ceftriaxone 2. I will monitor final culture results 3. I will arrange follow-up in my clinic in one month  Michel Bickers, MD [facility] for [place] [phone] pager   [phone] cell 11/29/2015, 10:28 AM

## 2015-11-30 DIAGNOSIS — Z452 Encounter for adjustment and management of vascular access device: Secondary | ICD-10-CM | POA: Diagnosis not present

## 2015-11-30 DIAGNOSIS — Z5181 Encounter for therapeutic drug level monitoring: Secondary | ICD-10-CM | POA: Diagnosis not present

## 2015-11-30 DIAGNOSIS — I10 Essential (primary) hypertension: Secondary | ICD-10-CM | POA: Diagnosis not present

## 2015-11-30 DIAGNOSIS — G4733 Obstructive sleep apnea (adult) (pediatric): Secondary | ICD-10-CM | POA: Diagnosis not present

## 2015-11-30 DIAGNOSIS — T84033D Mechanical loosening of internal left knee prosthetic joint, subsequent encounter: Secondary | ICD-10-CM | POA: Diagnosis not present

## 2015-11-30 LAB — BODY FLUID CULTURE: CULTURE: NO GROWTH

## 2015-12-01 DIAGNOSIS — Z5181 Encounter for therapeutic drug level monitoring: Secondary | ICD-10-CM | POA: Diagnosis not present

## 2015-12-01 DIAGNOSIS — G4733 Obstructive sleep apnea (adult) (pediatric): Secondary | ICD-10-CM | POA: Diagnosis not present

## 2015-12-01 DIAGNOSIS — I10 Essential (primary) hypertension: Secondary | ICD-10-CM | POA: Diagnosis not present

## 2015-12-01 DIAGNOSIS — T84033D Mechanical loosening of internal left knee prosthetic joint, subsequent encounter: Secondary | ICD-10-CM | POA: Diagnosis not present

## 2015-12-01 DIAGNOSIS — Z452 Encounter for adjustment and management of vascular access device: Secondary | ICD-10-CM | POA: Diagnosis not present

## 2015-12-01 NOTE — Progress Notes (Signed)
     Subjective: 1 Day Post-Op Procedure(s) (LRB): resection left total knee placement of spacers (Left)   Patient reports pain as moderate, controlled mainly with medication.  No events throughout the night.  Dr, Alvan Dame discussed the procedure and plans for the patient.  Plan on ID consult.  Objective:   VITALS:    11/28/15   BP: 104/44  Pulse: 72  Temp: 98.3 F (36.8 C)  Resp: 16    Neurovascular intact Incision: dressing C/D/I  LABS  Recent Labs  11/28/15 0625  HGB 9.2*  HCT 27.2*  WBC 8.7  PLT 164     Recent Labs  11/28/15 0625  NA 139  K 4.0  BUN 17  CREATININE 0.69  GLUCOSE 118*     Assessment/Plan: 1 Days Post-Op Procedure(s) (LRB): resection left total knee placement of spacers (Left) Foley cath d/c'ed Advance diet Up with therapy D/C IV fluids Discharge home with home health eventually, when ready     West Pugh. Lelania Bia   PAC  12/01/2015, 9:56 AM

## 2015-12-01 NOTE — Discharge Summary (Signed)
Physician Discharge Summary  Patient ID: [name] MRN: [id number] DOB/AGE: 66-05-1951 66 y.o.  Admit date: 11/27/2015 Discharge date: 11/29/2015   Procedures:  Procedure(s) (LRB): resection left total knee placement of spacers (Left)  Attending Physician:  Dr. 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 Hypertension   . Normal cardiac stress test 08-10-2004  . Bladder tumor     benign  . Blood transfusion   . Urgency of urination   . Arthritis KNEES AND HANDS  . DDD (degenerative disc disease), lumbar   . DDD (degenerative disc disease)   . Anxiety   . OSA on CPAP     uses cpap, pt does not know settings    HPI:    [name], 65 y.o. female, has a history of pain and functional disability in the left knee(s) due to failed previous arthroplasty and patient has failed non-surgical conservative treatments for greater than 12 weeks to include NSAID's and/or analgesics and activity modification. The indications for the revision of the total knee arthroplasty are loosening of one or more components. Onset of symptoms was gradual starting 1+ years ago with gradually worsening course since that time. Prior procedures on the left knee(s) include arthroplasty. Patient currently rates pain in the left knee(s) at 8 out of 10 with activity. There is worsening of pain with activity and weight bearing, pain that interferes with activities of daily living, pain with passive range of motion and joint swelling. Patient has evidence of prosthetic loosening by imaging studies. This condition presents safety issues increasing the risk of falls. There is no current active infection. Risks, benefits and  expectations were discussed with the patient. Risks including but not limited to the risk of anesthesia, blood clots, nerve damage, blood vessel damage, failure of the prosthesis, infection and up to and including death. Patient understand the risks, benefits and expectations and wishes to proceed with surgery.   PCP: Horatio Pel, MD   Discharged Condition: good  Hospital Course:  Patient underwent the above stated procedure on 11/27/2015. Patient tolerated the procedure well and brought to the recovery room in good condition and subsequently to the floor.  POD #1  BP: 104/44 ; Pulse: 72 ; Temp: 98.3 F (36.8 C) ; Resp: 16 Patient reports pain as moderate, controlled mainly with medication. No events throughout the night. Dr, Alvan Dame discussed the procedure and plans for the patient. Plan on ID consult. Neurovascular intact and incision: dressing C/D/I  LABS  Basename    HGB     9.2  HCT     27.2   POD #2 BP: 116/64 ; Pulse: 75 ; Temp: 97.9 F (36.6 C) ; Resp: 16 Patient reports pain as moderate, not fully controlled and still needing some Dilaudid IV. Discussed changing medication. No events throughout the night. Discussed what took place during surgery as well as the subsequent plan procedure. Ready to be discharged home if ID is good with the plan. Dorsiflexion/plantar flexion intact, incision: dressing C/D/I, no cellulitis present and compartment soft.   LABS  Basename    HGB     8.4  HCT     26.0    Discharge Exam: General  appearance: alert, cooperative and no distress Extremities: Homans sign is negative, no sign of DVT, no edema, redness or tenderness in the calves or thighs and no ulcers, gangrene or trophic changes  Disposition: Home with follow up in 2 weeks   Follow-up Information    Follow up with Mauri Pole, MD. Schedule an appointment as soon as possible for a visit in 2 weeks.   Specialty:  Orthopedic Surgery   Contact information:   [address] [place] [address] [phone]       Follow up with [facility].   Why:  home health physical therapy and nurse for IV administration   Contact information:   [address] [place] [address] [phone]       Discharge Instructions    Call MD / Call 911    Complete by:  As directed   If you experience chest pain or shortness of breath, CALL 911 and be transported to the hospital emergency room.  If you develope a fever above 101 F, pus (white drainage) or increased drainage or redness at the wound, or calf pain, call your surgeon's office.     Change dressing    Complete by:  As directed   Maintain surgical dressing until follow up in the clinic. If the edges start to pull up, may reinforce with tape. If the dressing is no longer working, may remove and cover with gauze and tape, but must keep the area dry and clean.  Call with any questions or concerns.     Constipation Prevention    Complete by:  As directed   Drink plenty of fluids.  Prune juice may be helpful.  You may use a stool softener, such as Colace (over the counter) 100 mg twice a day.  Use MiraLax (over the counter) for constipation as needed.     Diet - low sodium heart healthy    Complete by:  As directed      Discharge instructions    Complete by:  As directed   Maintain surgical dressing until follow up in the clinic. If the edges start to pull up, may reinforce with tape. If the dressing is no longer working, may remove and cover with gauze and tape, but must keep the area dry and clean.  Follow up in 2 weeks at [facility]. Call with any questions or concerns.     Partial weight bearing    Complete by:  As directed   % Body Weight:  25-50  Laterality:  left  Extremity:  Lower     TED hose    Complete by:  As directed   Use stockings (TED hose) for 2 weeks on both leg(s).  You may remove them at night for sleeping.               Medication List    STOP taking these medications        HYDROcodone-acetaminophen 5-325 MG tablet  Commonly known as:  NORCO      TAKE these medications        acetaminophen 325 MG tablet  Commonly known as:  TYLENOL  Take 1-2 tablets (325-650 mg total) by mouth every 6 (six) hours as needed for mild pain or moderate pain.     ALPRAZolam 0.25 MG tablet  Commonly known as:  XANAX  Take 0.25 mg by mouth 3 (three) times daily as needed for anxiety.     aspirin 325 MG  EC tablet  Take 1 tablet (325 mg total) by mouth 2 (two) times daily.     cefTRIAXone 1 g in dextrose 5 % 50 mL  Inject 1 g into the vein daily. (per [facility] protocol)     celecoxib 200 MG capsule  Commonly known as:  CELEBREX  Take 200 mg by mouth every morning.     cholecalciferol 1000 units tablet  Commonly known as:  VITAMIN D  Take 1,000 Units by mouth every morning.     docusate sodium 100 MG capsule  Commonly known as:  COLACE  Take 1 capsule (100 mg total) by mouth 2 (two) times daily.     estrogens (conjugated) 0.45 MG tablet  Commonly known as:  PREMARIN  Take 0.45 mg by mouth every morning. Take daily for 21 days then do not take for 7 days.     EXFORGE HCT 10-320-25 MG Tabs  Generic drug:  Amlodipine-Valsartan-HCTZ  Take 1 tablet by mouth every morning.     methocarbamol 500 MG tablet  Commonly known as:  ROBAXIN  Take 1 tablet (500 mg total) by mouth every 6 (six) hours as needed for muscle spasms.     multivitamin with minerals Tabs tablet  Take 1 tablet by mouth every morning.     oxyCODONE 5 MG immediate release tablet  Commonly known as:  Oxy IR/ROXICODONE  Take 1-3 tablets (5-15 mg total) by mouth every 4 (four) hours.     polyethylene glycol packet  Commonly known as:  MIRALAX / GLYCOLAX  Take 17 g by mouth 2 (two) times daily.     sertraline 50 MG tablet  Commonly known as:  ZOLOFT  Take 50 mg by mouth every morning.     vancomycin 1 GM/200ML Soln  Commonly known as:   VANCOCIN  Inject 200 mLs (1,000 mg total) into the vein every 12 (twelve) hours. (per [facility] protocol)     zolpidem 12.5 MG CR tablet  Commonly known as:  AMBIEN CR  Take 12.5 mg by mouth at bedtime.         Signed: West Pugh. Caitland Porchia   PA-C  12/01/2015, 9:52 AM

## 2015-12-04 ENCOUNTER — Telehealth: Payer: Self-pay | Admitting: *Deleted

## 2015-12-04 DIAGNOSIS — I10 Essential (primary) hypertension: Secondary | ICD-10-CM | POA: Diagnosis not present

## 2015-12-04 DIAGNOSIS — Z5181 Encounter for therapeutic drug level monitoring: Secondary | ICD-10-CM | POA: Diagnosis not present

## 2015-12-04 DIAGNOSIS — G4733 Obstructive sleep apnea (adult) (pediatric): Secondary | ICD-10-CM | POA: Diagnosis not present

## 2015-12-04 DIAGNOSIS — Z452 Encounter for adjustment and management of vascular access device: Secondary | ICD-10-CM | POA: Diagnosis not present

## 2015-12-04 DIAGNOSIS — T84033D Mechanical loosening of internal left knee prosthetic joint, subsequent encounter: Secondary | ICD-10-CM | POA: Diagnosis not present

## 2015-12-04 LAB — ANAEROBIC CULTURE: Gram Stain: NONE SEEN

## 2015-12-04 NOTE — Telephone Encounter (Signed)
Incoming call from [place].  Patient reports temperature of 100.2 over the weekend, RN measured 99.2 today at her visit.  Patient's knee is warm to the touch, no streaking or increase in pain.  Patient taking aspirin '325mg'$  after surgery, but no other antipyretics.  Today's labs have elevated ESR/CRP (65/9.0), WBC normal 9.0, vanc trough of 9.1 ([facility] to adjust). Katelyn just wanted to make sure Dr. Megan Salon was aware. Landis Gandy, RN

## 2015-12-05 DIAGNOSIS — T84033D Mechanical loosening of internal left knee prosthetic joint, subsequent encounter: Secondary | ICD-10-CM | POA: Diagnosis not present

## 2015-12-05 DIAGNOSIS — G4733 Obstructive sleep apnea (adult) (pediatric): Secondary | ICD-10-CM | POA: Diagnosis not present

## 2015-12-05 DIAGNOSIS — Z452 Encounter for adjustment and management of vascular access device: Secondary | ICD-10-CM | POA: Diagnosis not present

## 2015-12-05 DIAGNOSIS — I10 Essential (primary) hypertension: Secondary | ICD-10-CM | POA: Diagnosis not present

## 2015-12-05 DIAGNOSIS — Z5181 Encounter for therapeutic drug level monitoring: Secondary | ICD-10-CM | POA: Diagnosis not present

## 2015-12-07 ENCOUNTER — Telehealth: Payer: Self-pay | Admitting: *Deleted

## 2015-12-07 NOTE — Telephone Encounter (Signed)
Thanks. I agree with the switch to daptomycin.

## 2015-12-07 NOTE — Telephone Encounter (Signed)
Call from [place] for [place]. Vancomycin 1 gram was adjusted to 1.5 every 12 hours and she now has a diffuse rash on arms, chest, and legs. Nurse states it does not appear to be Redmans syndrome. Spoke to Dr. Scharlene Gloss partner for Dr. Michel Bickers and he gave an order for Daptomycin 6 mg per kg, and asked that I advise him as well as Dr. Megan Salon. Myrtis Hopping CMA

## 2015-12-08 ENCOUNTER — Telehealth: Payer: Self-pay | Admitting: *Deleted

## 2015-12-08 NOTE — Telephone Encounter (Signed)
   How is the rash today? Rash is worse today.   RN reported the pt's comment.  Dr. Megan Salon requested that the pt be added at 0900 on Mon., Feb. 27 to his schedule to evaluate the rash.  The pt is to continue the oral Cipro but remain off the IV vancomycin.  Phone call to the pt.  RN shared Dr. Hale Bogus comments and the pt is able to come for appointment on Monday, Feb. 27.   (Oral generic linezolid is $114 / month per her Part D Medicare provider.)

## 2015-12-11 ENCOUNTER — Ambulatory Visit (INDEPENDENT_AMBULATORY_CARE_PROVIDER_SITE_OTHER): Payer: PPO | Admitting: Internal Medicine

## 2015-12-11 VITALS — BP 127/73 | HR 109 | Temp 98.1°F | Ht 63.5 in | Wt 163.0 lb

## 2015-12-11 DIAGNOSIS — T7840XA Allergy, unspecified, initial encounter: Secondary | ICD-10-CM | POA: Diagnosis not present

## 2015-12-11 DIAGNOSIS — Z452 Encounter for adjustment and management of vascular access device: Secondary | ICD-10-CM | POA: Diagnosis not present

## 2015-12-11 DIAGNOSIS — I10 Essential (primary) hypertension: Secondary | ICD-10-CM | POA: Diagnosis not present

## 2015-12-11 DIAGNOSIS — T84033D Mechanical loosening of internal left knee prosthetic joint, subsequent encounter: Secondary | ICD-10-CM | POA: Diagnosis not present

## 2015-12-11 DIAGNOSIS — G4733 Obstructive sleep apnea (adult) (pediatric): Secondary | ICD-10-CM | POA: Diagnosis not present

## 2015-12-11 DIAGNOSIS — Z5181 Encounter for therapeutic drug level monitoring: Secondary | ICD-10-CM | POA: Diagnosis not present

## 2015-12-11 NOTE — Progress Notes (Signed)
[facility] for Infectious Disease  Patient Active Problem List   Diagnosis Date Noted  . Allergic reaction 12/11/2015    Priority: High  . Infection of prosthetic left knee joint ([place]) 11/28/2015    Priority: High  . Left knee abx spacer 11/27/2015    Priority: High  . Status post total left knee replacement 11/18/2013    Priority: High  . Overweight (BMI 25.0-29.9) 11/29/2015  . Acute blood loss anemia 11/19/2013  . Osteoarthritis of right knee 11/18/2013  . Bladder tumor 12/27/2011  . Breast mass seen on mammogram 06/26/2011    Patient's Medications  New Prescriptions   No medications on file  Previous Medications   ACETAMINOPHEN (TYLENOL) 325 MG TABLET    Take 1-2 tablets (325-650 mg total) by mouth every 6 (six) hours as needed for mild pain or moderate pain.   ALPRAZOLAM (XANAX) 0.25 MG TABLET    Take 0.25 mg by mouth 3 (three) times daily as needed for anxiety.   AMLODIPINE-VALSARTAN-HCTZ (EXFORGE HCT) 10-320-25 MG TABS    Take 1 tablet by mouth every morning.   ASPIRIN EC 325 MG EC TABLET    Take 1 tablet (325 mg total) by mouth 2 (two) times daily.   CEFTRIAXONE 1 G IN DEXTROSE 5 % 50 ML    Inject 1 g into the vein daily. (per [facility] protocol)   CELECOXIB (CELEBREX) 200 MG CAPSULE    Take 200 mg by mouth every morning.    CHOLECALCIFEROL (VITAMIN D) 1000 UNITS TABLET    Take 1,000 Units by mouth every morning.   DOCUSATE SODIUM (COLACE) 100 MG CAPSULE    Take 1 capsule (100 mg total) by mouth 2 (two) times daily.   ESTROGENS, CONJUGATED, (PREMARIN) 0.45 MG TABLET    Take 0.45 mg by mouth every morning. Take daily for 21 days then do not take for 7 days.   METHOCARBAMOL (ROBAXIN) 500 MG TABLET    Take 1 tablet (500 mg total) by mouth every 6 (six) hours as needed for muscle spasms.   MULTIPLE VITAMIN (MULTIVITAMIN WITH MINERALS) TABS TABLET    Take 1 tablet by mouth every morning.   OXYCODONE (OXY IR/ROXICODONE) 5 MG IMMEDIATE RELEASE TABLET    Take 1-3  tablets (5-15 mg total) by mouth every 4 (four) hours.   POLYETHYLENE GLYCOL (MIRALAX / GLYCOLAX) PACKET    Take 17 g by mouth 2 (two) times daily.   SERTRALINE (ZOLOFT) 50 MG TABLET    Take 50 mg by mouth every morning.   ZOLPIDEM (AMBIEN CR) 12.5 MG CR TABLET    Take 12.5 mg by mouth at bedtime.   Modified Medications   No medications on file  Discontinued Medications   VANCOMYCIN (VANCOCIN) 1 GM/200ML SOLN    Inject 200 mLs (1,000 mg total) into the vein every 12 (twelve) hours. (per [facility] protocol)    Subjective: [name] is in for a work in visit. She is a 65 y.o. female with degenerative joint disease who underwent left total knee arthroplasty in 2008. Over the past year she has had progressive pain in her left knee. A bone scan last May was compatible with loosening of the prosthesis. She had to delay surgery on her knee because she had surgery on her left foot recently. She was admitted recently with a plan to undergo joint replacement but at the time of surgery Dr. Alvan Dame encountered "cloudy fluid" so he performed resection arthroplasty with spacer placement.  Synovial fluid white blood cell counts could not be obtained because specimen clotted. Operative Gram stain showed no organisms and cultures were negative. Nonetheless, I elected to treat her for possible culture negative prosthetic joint infection. She was discharged on vancomycin and ceftriaxone 2 weeks ago. She called 3 days ago after developing a diffuse splotchy red and pruritic rash. I asked her to stop vancomycin and continue ceftriaxone. She states that the rash is fading now.  Review of Systems: Review of Systems  Constitutional: Positive for malaise/fatigue. Negative for fever, chills, weight loss and diaphoresis.  HENT: Negative for sore throat.   Respiratory: Negative for cough, sputum production and shortness of breath.   Cardiovascular: Negative for chest pain.  Gastrointestinal: Negative for nausea, vomiting, abdominal  pain and diarrhea.  Musculoskeletal: Positive for joint pain. Negative for myalgias.  Skin: Positive for rash.  Neurological: Negative for headaches.    Past Medical History  Diagnosis Date  . Hypertension   . Normal cardiac stress test 08-10-2004  . Bladder tumor     benign  . Blood transfusion   . Urgency of urination   . Arthritis KNEES AND HANDS  . DDD (degenerative disc disease), lumbar   . DDD (degenerative disc disease)   . Anxiety   . OSA on CPAP     uses cpap, pt does not know settings    Social History  Substance Use Topics  . Smoking status: Former Smoker -- 0.25 packs/day for 50 years    Types: Cigarettes    Quit date: 06/25/1980  . Smokeless tobacco: Never Used  . Alcohol Use: 0.6 oz/week    1 Glasses of wine per week     Comment: occasional    Family History  Problem Relation Age of Onset  . Cancer Mother     colon  . Cancer Father     lung    No Known Allergies  Objective: Filed Vitals:   12/11/15 0846  BP: 127/73  Pulse: 109  Temp: 98.1 F (36.7 C)  TempSrc: Oral  Height: 5' 3.5" (1.613 m)  Weight: 163 lb (73.936 kg)   Body mass index is 28.42 kg/(m^2).  Physical Exam  Constitutional: She is oriented to person, place, and time. No distress.  HENT:  Mouth/Throat: No oropharyngeal exudate.  Eyes: Conjunctivae are normal.  Cardiovascular: Normal rate and regular rhythm.   No murmur heard. Pulmonary/Chest: Breath sounds normal.  Musculoskeletal:  Her left leg is in a soft brace.  Neurological: She is alert and oriented to person, place, and time.  Skin: Rash noted.  She has a diffuse red, maculopapular rash. Right arm PICC site appears normal.  Psychiatric: Mood and affect normal.    Lab Results    Problem List Items Addressed This Visit      High   Allergic reaction - Primary    I suspect that she has developed an allergic reaction to vancomycin. Her rash is fading after stopping vancomycin 3 days ago. I will call her in 48  hours. If she continues to improve I will have her continue ceftriaxone and start linezolid in place of vancomycin.          Michel Bickers, MD [facility] for [place] [facility] [phone] pager   [phone] cell 12/11/2015, 9:57 AM

## 2015-12-11 NOTE — Assessment & Plan Note (Signed)
I suspect that she has developed an allergic reaction to vancomycin. Her rash is fading after stopping vancomycin 3 days ago. I will call her in 48 hours. If she continues to improve I will have her continue ceftriaxone and start linezolid in place of vancomycin.

## 2015-12-12 DIAGNOSIS — Z471 Aftercare following joint replacement surgery: Secondary | ICD-10-CM | POA: Diagnosis not present

## 2015-12-12 DIAGNOSIS — T84093D Other mechanical complication of internal left knee prosthesis, subsequent encounter: Secondary | ICD-10-CM | POA: Diagnosis not present

## 2015-12-13 ENCOUNTER — Telehealth: Payer: Self-pay | Admitting: *Deleted

## 2015-12-13 ENCOUNTER — Telehealth: Payer: Self-pay | Admitting: Internal Medicine

## 2015-12-13 ENCOUNTER — Other Ambulatory Visit: Payer: Self-pay | Admitting: Internal Medicine

## 2015-12-13 DIAGNOSIS — T8454XA Infection and inflammatory reaction due to internal left knee prosthesis, initial encounter: Secondary | ICD-10-CM

## 2015-12-13 MED ORDER — LINEZOLID 600 MG PO TABS: 600.0000 mg | ORAL_TABLET | Freq: Two times a day (BID) | ORAL | Status: DC

## 2015-12-13 NOTE — Telephone Encounter (Signed)
I called [name] this morning. She is doing much better and her rash continues to fade off of vancomycin. She seems to be tolerating ceftriaxone well. I will start her on linezolid in place of vancomycin and continue ceftriaxone. She has been taking sertraline for several years for anxiety. However she has taken herself off cold [place] on several occasions without difficulty. I've asked her to wean off over the next few days and then not to take sertraline while she is on linezolid. She is in agreement with that plan. She will follow-up with me on 01/01/2016.

## 2015-12-13 NOTE — Telephone Encounter (Signed)
Question about continuing IV Rocephin?  RN shared Dr. Hale Bogus note from 12/11/15 and conversation with Dr. Megan Salon with [place], [place].  IV Rocephin will continue, Vancomycin is discontinued.  Pt will start oral linezolid.

## 2015-12-18 ENCOUNTER — Telehealth: Payer: Self-pay | Admitting: *Deleted

## 2015-12-18 ENCOUNTER — Other Ambulatory Visit: Payer: Self-pay | Admitting: Internal Medicine

## 2015-12-18 DIAGNOSIS — Z5181 Encounter for therapeutic drug level monitoring: Secondary | ICD-10-CM | POA: Diagnosis not present

## 2015-12-18 DIAGNOSIS — I10 Essential (primary) hypertension: Secondary | ICD-10-CM | POA: Diagnosis not present

## 2015-12-18 DIAGNOSIS — G4733 Obstructive sleep apnea (adult) (pediatric): Secondary | ICD-10-CM | POA: Diagnosis not present

## 2015-12-18 DIAGNOSIS — T84033D Mechanical loosening of internal left knee prosthetic joint, subsequent encounter: Secondary | ICD-10-CM | POA: Diagnosis not present

## 2015-12-18 DIAGNOSIS — T8454XA Infection and inflammatory reaction due to internal left knee prosthesis, initial encounter: Secondary | ICD-10-CM

## 2015-12-18 DIAGNOSIS — Z452 Encounter for adjustment and management of vascular access device: Secondary | ICD-10-CM | POA: Diagnosis not present

## 2015-12-18 MED ORDER — ONDANSETRON HCL 4 MG PO TABS
4.0000 mg | ORAL_TABLET | Freq: Three times a day (TID) | ORAL | Status: DC | PRN
Start: 2015-12-18 — End: 2016-01-17

## 2015-12-18 NOTE — Telephone Encounter (Signed)
Patient called and advised she is having sever nausea for several hours since starting new medication. She wants the doctor to call in some phenergan if possible. She uses [facility]. Advised patient will send the doctor a message and call her back once I get a response.

## 2015-12-18 NOTE — Telephone Encounter (Signed)
I ordered a prescription for Zofran 4 mg by mouth as needed every 8 hours, #60 with 1 refill.

## 2015-12-25 ENCOUNTER — Telehealth: Payer: Self-pay | Admitting: *Deleted

## 2015-12-25 DIAGNOSIS — I10 Essential (primary) hypertension: Secondary | ICD-10-CM | POA: Diagnosis not present

## 2015-12-25 DIAGNOSIS — Z452 Encounter for adjustment and management of vascular access device: Secondary | ICD-10-CM | POA: Diagnosis not present

## 2015-12-25 DIAGNOSIS — Z5181 Encounter for therapeutic drug level monitoring: Secondary | ICD-10-CM | POA: Diagnosis not present

## 2015-12-25 DIAGNOSIS — G4733 Obstructive sleep apnea (adult) (pediatric): Secondary | ICD-10-CM | POA: Diagnosis not present

## 2015-12-25 DIAGNOSIS — T84033D Mechanical loosening of internal left knee prosthetic joint, subsequent encounter: Secondary | ICD-10-CM | POA: Diagnosis not present

## 2015-12-25 NOTE — Telephone Encounter (Signed)
Received call from [place].  Patient's vancomycin has been changed to linezolid.  Because of potential interactions, patient is not taking sertraline while taking linezolid.  Patietn thought she could be off of the sertraline without issue, but she is experiencing increase in depressive symptoms. She wants to know what a safe alternative may be instead.  Please advise. Landis Gandy, RN

## 2015-12-26 NOTE — Telephone Encounter (Signed)
I spoke with [name] this morning. She states that ever since she stopped taking her sertraline 2 weeks ago she has become more depressed then starts crying spontaneously. She continues to take ceftriaxone and linezolid without difficulty. She is now completed about 30 days of antibiotic therapy. I reviewed the situation with our ID pharmacist, Onnie Boer. She will complete her antibiotic therapy in about 2 weeks. The dose of sertraline she was taking is fairly low in the chance that she will develop significant serotonin syndrome in the next 2 weeks is very low. I instructed her to restart sertraline now. She has a follow-up visit with me in 6 days. I asked her to call me if she developed any tremors, agitation, fevers or other new problems before that visit.

## 2016-01-01 ENCOUNTER — Ambulatory Visit (INDEPENDENT_AMBULATORY_CARE_PROVIDER_SITE_OTHER): Payer: PPO | Admitting: Internal Medicine

## 2016-01-01 ENCOUNTER — Encounter: Payer: Self-pay | Admitting: Internal Medicine

## 2016-01-01 VITALS — BP 127/71 | HR 85 | Temp 98.5°F | Ht 64.0 in | Wt 162.0 lb

## 2016-01-01 DIAGNOSIS — T8454XA Infection and inflammatory reaction due to internal left knee prosthesis, initial encounter: Secondary | ICD-10-CM

## 2016-01-01 NOTE — Progress Notes (Signed)
[facility] for Infectious Disease  Patient Active Problem List   Diagnosis Date Noted  . Allergic reaction 12/11/2015    Priority: High  . Infection of prosthetic left knee joint ([place]) 11/28/2015    Priority: High  . Left knee abx spacer 11/27/2015    Priority: High  . Status post total left knee replacement 11/18/2013    Priority: High  . Overweight (BMI 25.0-29.9) 11/29/2015  . Acute blood loss anemia 11/19/2013  . Osteoarthritis of right knee 11/18/2013  . Bladder tumor 12/27/2011  . Breast mass seen on mammogram 06/26/2011    Patient's Medications  New Prescriptions   No medications on file  Previous Medications   ACETAMINOPHEN (TYLENOL) 325 MG TABLET    Take 1-2 tablets (325-650 mg total) by mouth every 6 (six) hours as needed for mild pain or moderate pain.   ALPRAZOLAM (XANAX) 0.25 MG TABLET    Take 0.25 mg by mouth 3 (three) times daily as needed for anxiety.   AMLODIPINE-VALSARTAN-HCTZ (EXFORGE HCT) 10-320-25 MG TABS    Take 1 tablet by mouth every morning.   CELECOXIB (CELEBREX) 200 MG CAPSULE    Take 200 mg by mouth every morning.    CHOLECALCIFEROL (VITAMIN D) 1000 UNITS TABLET    Take 1,000 Units by mouth every morning.   DOCUSATE SODIUM (COLACE) 100 MG CAPSULE    Take 1 capsule (100 mg total) by mouth 2 (two) times daily.   ESTROGENS, CONJUGATED, (PREMARIN) 0.45 MG TABLET    Take 0.45 mg by mouth every morning. Reported on 01/01/2016   METHOCARBAMOL (ROBAXIN) 500 MG TABLET    Take 1 tablet (500 mg total) by mouth every 6 (six) hours as needed for muscle spasms.   MULTIPLE VITAMIN (MULTIVITAMIN WITH MINERALS) TABS TABLET    Take 1 tablet by mouth every morning.   ONDANSETRON (ZOFRAN) 4 MG TABLET    Take 1 tablet (4 mg total) by mouth every 8 (eight) hours as needed for nausea or vomiting.   OXYCODONE (OXY IR/ROXICODONE) 5 MG IMMEDIATE RELEASE TABLET    Take 1-3 tablets (5-15 mg total) by mouth every 4 (four) hours.   POLYETHYLENE GLYCOL (MIRALAX /  GLYCOLAX) PACKET    Take 17 g by mouth 2 (two) times daily.   SERTRALINE (ZOLOFT) 50 MG TABLET    Take 50 mg by mouth every morning.   ZOLPIDEM (AMBIEN CR) 12.5 MG CR TABLET    Take 12.5 mg by mouth at bedtime.   Modified Medications   No medications on file  Discontinued Medications   CEFTRIAXONE 1 G IN DEXTROSE 5 % 50 ML    Inject 1 g into the vein daily. (per [facility] protocol)   LINEZOLID (ZYVOX) 600 MG TABLET    Take 1 tablet (600 mg total) by mouth 2 (two) times daily.    Subjective: [name] is in for her hospital follow-up visit. She is being treated for culture negative left prosthetic knee infection. She underwent removal of her loosened prosthesis last month and had an antibiotic spacer placed. She was discharged on IV vancomycin and ceftriaxone but developed a diffuse pruritic rash. This resolved after stopping vancomycin and she was switched to linezolid. Because of a drug drug interaction I had her stop taking her sertraline while she was on linezolid. She called last week complaining of increased depression with spontaneous bouts of crying. I told her that it would be okay to restart low-dose sertraline. The following day her  orthopedic surgeon, Dr. Adriana Mccallum had her stop linezolid. She is feeling much better and is no longer feeling depressed. She has not had no problems tolerating her ceftriaxone or PICC. She is scheduled for revision arthroplasty on 01/15/2016. She is now completed 38 days of antibiotic therapy.  Review of Systems: Review of Systems  Constitutional: Negative for fever, chills, weight loss, malaise/fatigue and diaphoresis.  HENT: Negative for sore throat.   Respiratory: Negative for cough, sputum production and shortness of breath.   Cardiovascular: Negative for chest pain.  Gastrointestinal: Negative for nausea, vomiting and diarrhea.  Genitourinary: Negative for dysuria and frequency.  Musculoskeletal: Positive for joint pain. Negative for myalgias.  Skin:  Negative for rash.  Neurological: Negative for dizziness and headaches.  Psychiatric/Behavioral: Negative for depression and substance abuse. The patient is not nervous/anxious.     Past Medical History  Diagnosis Date  . Hypertension   . Normal cardiac stress test 08-10-2004  . Bladder tumor     benign  . Blood transfusion   . Urgency of urination   . Arthritis KNEES AND HANDS  . DDD (degenerative disc disease), lumbar   . DDD (degenerative disc disease)   . Anxiety   . OSA on CPAP     uses cpap, pt does not know settings    Social History  Substance Use Topics  . Smoking status: Former Smoker -- 0.25 packs/day for 50 years    Types: Cigarettes    Quit date: 06/25/1980  . Smokeless tobacco: Never Used  . Alcohol Use: 0.6 oz/week    1 Glasses of wine per week     Comment: occasional    Family History  Problem Relation Age of Onset  . Cancer Mother     colon  . Cancer Father     lung    No Known Allergies  Objective: Filed Vitals:   01/01/16 1350  BP: 127/71  Pulse: 85  Temp: 98.5 F (36.9 C)  TempSrc: Oral  Height: 5\' 4"  (1.626 m)  Weight: 162 lb (73.483 kg)   Body mass index is 27.79 kg/(m^2).  Physical Exam  Constitutional:  She is smiling and in good spirits.  Cardiovascular: Normal rate and regular rhythm.   No murmur heard. Pulmonary/Chest: Breath sounds normal.  Musculoskeletal:  Her left knee incision is healing nicely.  Skin: No rash noted.  PICC site appears normal.  Psychiatric: Mood and affect normal.    Lab Results Sedimentation rate 12/25/2015: 17 C-reactive protein 12/25/2015: 0.5   Problem List Items Addressed This Visit      High   Infection of prosthetic left knee joint ([place]) - Primary    She is doing well and her inflammatory markers have normalized. Her home nurse is coming out tomorrow. I will go ahead and have her remove PICC and stop ceftriaxone after tomorrow's dose. I talked to her about the risk of PICC-related  infection and/or blood clots. She had wanted to leave the PICC in through her upcoming surgery but after discussing these risk we decided to have it removed. She can follow-up here as needed.          Michel Bickers, MD [facility] [phone] pager   [phone] cell 01/01/2016, 2:07 PM

## 2016-01-01 NOTE — Assessment & Plan Note (Signed)
She is doing well and her inflammatory markers have normalized. Her home nurse is coming out tomorrow. I will go ahead and have her remove PICC and stop ceftriaxone after tomorrow's dose. I talked to her about the risk of PICC-related infection and/or blood clots. She had wanted to leave the PICC in through her upcoming surgery but after discussing these risk we decided to have it removed. She can follow-up here as needed.

## 2016-01-02 ENCOUNTER — Inpatient Hospital Stay: Payer: PPO | Admitting: Internal Medicine

## 2016-01-02 DIAGNOSIS — T84033D Mechanical loosening of internal left knee prosthetic joint, subsequent encounter: Secondary | ICD-10-CM | POA: Diagnosis not present

## 2016-01-02 DIAGNOSIS — I10 Essential (primary) hypertension: Secondary | ICD-10-CM | POA: Diagnosis not present

## 2016-01-02 DIAGNOSIS — Z452 Encounter for adjustment and management of vascular access device: Secondary | ICD-10-CM | POA: Diagnosis not present

## 2016-01-02 DIAGNOSIS — G4733 Obstructive sleep apnea (adult) (pediatric): Secondary | ICD-10-CM | POA: Diagnosis not present

## 2016-01-02 DIAGNOSIS — Z5181 Encounter for therapeutic drug level monitoring: Secondary | ICD-10-CM | POA: Diagnosis not present

## 2016-01-04 ENCOUNTER — Other Ambulatory Visit: Payer: PPO

## 2016-01-04 ENCOUNTER — Telehealth: Payer: Self-pay | Admitting: *Deleted

## 2016-01-04 DIAGNOSIS — E875 Hyperkalemia: Secondary | ICD-10-CM

## 2016-01-04 LAB — BASIC METABOLIC PANEL
BUN: 21 mg/dL (ref 7–25)
CO2: 26 mmol/L (ref 20–31)
Calcium: 9.6 mg/dL (ref 8.6–10.4)
Chloride: 101 mmol/L (ref 98–110)
Creat: 0.75 mg/dL (ref 0.50–0.99)
Glucose, Bld: 78 mg/dL (ref 65–99)
POTASSIUM: 4.3 mmol/L (ref 3.5–5.3)
SODIUM: 140 mmol/L (ref 135–146)

## 2016-01-04 NOTE — Patient Instructions (Addendum)
[name]  01/04/2016   Your procedure is scheduled on: 01/15/2016    Report to [facility] Main  Entrance take [place]  elevators to 3rd floor to  [place] at    [place] AM.  Call this number if you have problems the morning of surgery [phone]   Remember: ONLY 1 PERSON MAY GO WITH YOU TO SHORT STAY TO GET  READY MORNING OF [place].  Do not eat food or drink liquids :After Midnight.     Take these medicines the morning of surgery with A SIP OF WATER: , Oxycodone if needed,  Zoloft ( Sertraline)                                You may not have any metal on your body including hair pins and              piercings  Do not wear jewelry, make-up, lotions, powders or perfumes, deodorant             Do not wear nail polish.  Do not shave  48 hours prior to surgery.             Do not bring valuables to the hospital. [place].  Contacts, dentures or bridgework may not be worn into surgery.  Leave suitcase in the car. After surgery it may be brought to your room.        Special Instructions: coughing and deep breathing exercises, leg exercises               Please read over the following fact sheets you were given: _____________________________________________________________________             [facility] - Preparing for Surgery Before surgery, you can play an important role.  Because skin is not sterile, your skin needs to be as free of germs as possible.  You can reduce the number of germs on your skin by washing with CHG (chlorahexidine gluconate) soap before surgery.  CHG is an antiseptic cleaner which kills germs and bonds with the skin to continue killing germs even after washing. Please DO NOT use if you have an allergy to CHG or antibacterial soaps.  If your skin becomes reddened/irritated stop using the CHG and inform your nurse when you arrive at Short Stay. Do not shave (including legs  and underarms) for at least 48 hours prior to the first CHG shower.  You may shave your face/neck. Please follow these instructions carefully:  1.  Shower with CHG Soap the night before surgery and the  morning of Surgery.  2.  If you choose to wash your hair, wash your hair first as usual with your  normal  shampoo.  3.  After you shampoo, rinse your hair and body thoroughly to remove the  shampoo.                           4.  Use CHG as you would any other liquid soap.  You can apply chg directly  to the skin and wash  Gently with a scrungie or clean washcloth.  5.  Apply the CHG Soap to your body ONLY FROM THE NECK DOWN.   Do not use on face/ open                           Wound or open sores. Avoid contact with eyes, ears mouth and genitals (private parts).                       Wash face,  Genitals (private parts) with your normal soap.             6.  Wash thoroughly, paying special attention to the area where your surgery  will be performed.  7.  Thoroughly rinse your body with warm water from the neck down.  8.  DO NOT shower/wash with your normal soap after using and rinsing off  the CHG Soap.                9.  Pat yourself dry with a clean towel.            10.  Wear clean pajamas.            11.  Place clean sheets on your bed the night of your first shower and do not  sleep with pets. Day of Surgery : Do not apply any lotions/deodorants the morning of surgery.  Please wear clean clothes to the hospital/surgery center.  FAILURE TO FOLLOW THESE INSTRUCTIONS MAY RESULT IN THE CANCELLATION OF YOUR SURGERY PATIENT SIGNATURE_________________________________  NURSE SIGNATURE__________________________________  ________________________________________________________________________  WHAT IS A BLOOD TRANSFUSION? Blood Transfusion Information  A transfusion is the replacement of blood or some of its parts. Blood is made up of multiple cells which provide different  functions.  Red blood cells carry oxygen and are used for blood loss replacement.  White blood cells fight against infection.  Platelets control bleeding.  Plasma helps clot blood.  Other blood products are available for specialized needs, such as hemophilia or other clotting disorders. BEFORE THE TRANSFUSION  Who gives blood for transfusions?   Healthy volunteers who are fully evaluated to make sure their blood is safe. This is blood bank blood. Transfusion therapy is the safest it has ever been in the practice of medicine. Before blood is taken from a donor, a complete history is taken to make sure that person has no history of diseases nor engages in risky social behavior (examples are intravenous drug use or sexual activity with multiple partners). The donor's travel history is screened to minimize risk of transmitting infections, such as malaria. The donated blood is tested for signs of infectious diseases, such as HIV and hepatitis. The blood is then tested to be sure it is compatible with you in order to minimize the chance of a transfusion reaction. If you or a relative donates blood, this is often done in anticipation of surgery and is not appropriate for emergency situations. It takes many days to process the donated blood. RISKS AND COMPLICATIONS Although transfusion therapy is very safe and saves many lives, the main dangers of transfusion include:  1. Getting an infectious disease. 2. Developing a transfusion reaction. This is an allergic reaction to something in the blood you were given. Every precaution is taken to prevent this. The decision to have a blood transfusion has been considered carefully by your caregiver before blood is given. Blood is not given unless the benefits outweigh  the risks. AFTER THE TRANSFUSION  Right after receiving a blood transfusion, you will usually feel much better and more energetic. This is especially true if your red blood cells have gotten low  (anemic). The transfusion raises the level of the red blood cells which carry oxygen, and this usually causes an energy increase.  The nurse administering the transfusion will monitor you carefully for complications. HOME CARE INSTRUCTIONS  No special instructions are needed after a transfusion. You may find your energy is better. Speak with your caregiver about any limitations on activity for underlying diseases you may have. SEEK MEDICAL CARE IF:   Your condition is not improving after your transfusion.  You develop redness or irritation at the intravenous (IV) site. SEEK IMMEDIATE MEDICAL CARE IF:  Any of the following symptoms occur over the next 12 hours:  Shaking chills.  You have a temperature by mouth above 102 F (38.9 C), not controlled by medicine.  Chest, back, or muscle pain.  People around you feel you are not acting correctly or are confused.  Shortness of breath or difficulty breathing.  Dizziness and fainting.  You get a rash or develop hives.  You have a decrease in urine output.  Your urine turns a dark color or changes to pink, red, or brown. Any of the following symptoms occur over the next 10 days:  You have a temperature by mouth above 102 F (38.9 C), not controlled by medicine.  Shortness of breath.  Weakness after normal activity.  The white part of the eye turns yellow (jaundice).  You have a decrease in the amount of urine or are urinating less often.  Your urine turns a dark color or changes to pink, red, or brown. Document Released: 09/27/2000 Document Revised: 12/23/2011 Document Reviewed: 05/16/2008 ExitCare Patient Information 2014 [place].  _______________________________________________________________________  Incentive Spirometer  An incentive spirometer is a tool that can help keep your lungs clear and active. This tool measures how well you are filling your lungs with each breath. Taking long deep breaths may help  reverse or decrease the chance of developing breathing (pulmonary) problems (especially infection) following:  A long period of time when you are unable to move or be active. BEFORE THE PROCEDURE   If the spirometer includes an indicator to show your best effort, your nurse or respiratory therapist will set it to a desired goal.  If possible, sit up straight or lean slightly forward. Try not to slouch.  Hold the incentive spirometer in an upright position. INSTRUCTIONS FOR USE  3. Sit on the edge of your bed if possible, or sit up as far as you can in bed or on a chair. 4. Hold the incentive spirometer in an upright position. 5. Breathe out normally. 6. Place the mouthpiece in your mouth and seal your lips tightly around it. 7. Breathe in slowly and as deeply as possible, raising the piston or the ball toward the top of the column. 8. Hold your breath for 3-5 seconds or for as long as possible. Allow the piston or ball to fall to the bottom of the column. 9. Remove the mouthpiece from your mouth and breathe out normally. 10. Rest for a few seconds and repeat Steps 1 through 7 at least 10 times every 1-2 hours when you are awake. Take your time and take a few normal breaths between deep breaths. 11. The spirometer may include an indicator to show your best effort. Use the indicator as a goal to work  toward during each repetition. 12. After each set of 10 deep breaths, practice coughing to be sure your lungs are clear. If you have an incision (the cut made at the time of surgery), support your incision when coughing by placing a pillow or rolled up towels firmly against it. Once you are able to get out of bed, walk around indoors and cough well. You may stop using the incentive spirometer when instructed by your caregiver.  RISKS AND COMPLICATIONS  Take your time so you do not get dizzy or light-headed.  If you are in pain, you may need to take or ask for pain medication before doing  incentive spirometry. It is harder to take a deep breath if you are having pain. AFTER USE  Rest and breathe slowly and easily.  It can be helpful to keep track of a log of your progress. Your caregiver can provide you with a simple table to help with this. If you are using the spirometer at home, follow these instructions: [place] IF:   You are having difficultly using the spirometer.  You have trouble using the spirometer as often as instructed.  Your pain medication is not giving enough relief while using the spirometer.  You develop fever of 100.5 F (38.1 C) or higher. SEEK IMMEDIATE MEDICAL CARE IF:   You cough up bloody sputum that had not been present before.  You develop fever of 102 F (38.9 C) or greater.  You develop worsening pain at or near the incision site. MAKE SURE YOU:   Understand these instructions.  Will watch your condition.  Will get help right away if you are not doing well or get worse. Document Released: 02/10/2007 Document Revised: 12/23/2011 Document Reviewed: 04/13/2007 [facility] Patient Information 2014 [place], [place].   ________________________________________________________________________

## 2016-01-04 NOTE — Telephone Encounter (Signed)
PICC pulled 3/21 and patient discharged from [facility] per verbal order from Dr. Megan Salon.  [facility] RN Caitlyn drew labs before PICC pull, but they were not processed immediately.  Potassium = 7.4, but had prolonged contact with RBC.  Patient asymptomatic.  She will come to [facility] for repeat bmp today per Dr Megan Salon. Orders placed, appointment made. Landis Gandy, RN

## 2016-01-08 ENCOUNTER — Encounter: Payer: Self-pay | Admitting: Internal Medicine

## 2016-01-08 ENCOUNTER — Encounter (HOSPITAL_COMMUNITY): Payer: Self-pay

## 2016-01-08 ENCOUNTER — Encounter (HOSPITAL_COMMUNITY)
Admission: RE | Admit: 2016-01-08 | Discharge: 2016-01-08 | Disposition: A | Discharge status: Home / Self Care | Payer: PPO | Source: Ambulatory Visit | Attending: Orthopedic Surgery | Admitting: Orthopedic Surgery

## 2016-01-08 DIAGNOSIS — Z01810 Encounter for preprocedural cardiovascular examination: Secondary | ICD-10-CM | POA: Diagnosis not present

## 2016-01-08 DIAGNOSIS — I10 Essential (primary) hypertension: Secondary | ICD-10-CM | POA: Insufficient documentation

## 2016-01-08 DIAGNOSIS — Z01812 Encounter for preprocedural laboratory examination: Secondary | ICD-10-CM | POA: Insufficient documentation

## 2016-01-08 LAB — URINALYSIS, ROUTINE W REFLEX MICROSCOPIC
BILIRUBIN URINE: NEGATIVE
Glucose, UA: NEGATIVE mg/dL
Hgb urine dipstick: NEGATIVE
KETONES UR: NEGATIVE mg/dL
NITRITE: NEGATIVE
Protein, ur: NEGATIVE mg/dL
Specific Gravity, Urine: 1.01 (ref 1.005–1.030)
pH: 6.5 (ref 5.0–8.0)

## 2016-01-08 LAB — URINE MICROSCOPIC-ADD ON

## 2016-01-08 LAB — CBC
HCT: 33.6 % — ABNORMAL LOW (ref 36.0–46.0)
HEMOGLOBIN: 10.8 g/dL — AB (ref 12.0–15.0)
MCH: 28.5 pg (ref 26.0–34.0)
MCHC: 32.1 g/dL (ref 30.0–36.0)
MCV: 88.7 fL (ref 78.0–100.0)
PLATELETS: 248 10*3/uL (ref 150–400)
RBC: 3.79 MIL/uL — AB (ref 3.87–5.11)
RDW: 14.3 % (ref 11.5–15.5)
WBC: 7.4 10*3/uL (ref 4.0–10.5)

## 2016-01-08 LAB — PROTIME-INR
INR: 1.01 (ref 0.00–1.49)
Prothrombin Time: 13.1 seconds (ref 11.6–15.2)

## 2016-01-08 LAB — BASIC METABOLIC PANEL
Anion gap: 9 (ref 5–15)
BUN: 25 mg/dL — ABNORMAL HIGH (ref 6–20)
CHLORIDE: 105 mmol/L (ref 101–111)
CO2: 29 mmol/L (ref 22–32)
CREATININE: 0.66 mg/dL (ref 0.44–1.00)
Calcium: 10 mg/dL (ref 8.9–10.3)
GFR calc non Af Amer: >60 mL/min (ref >60)
GLUCOSE: 73 mg/dL (ref 65–99)
Potassium: 4.1 mmol/L (ref 3.5–5.1)
Sodium: 143 mmol/L (ref 135–145)

## 2016-01-08 LAB — SURGICAL PCR SCREEN
MRSA, PCR: NEGATIVE
STAPHYLOCOCCUS AUREUS: NEGATIVE

## 2016-01-08 LAB — APTT: aPTT: 32 seconds (ref 24–37)

## 2016-01-08 NOTE — Progress Notes (Signed)
EKG- 06/06/2017 on chart

## 2016-01-08 NOTE — Progress Notes (Signed)
Requested EKG doine 05/2015 from office of Dr Shelia Media.

## 2016-01-09 NOTE — H&P (Signed)
[name] is an 65 y.o. female.    Chief Complaint:   S/P failed left total knee arthroplasty, questionable septic versus aseptic failure, and placement of antibiotic spacer  Procedure:    Reimplantation of the left total knee arthroplasty  HPI: Pt is a 65 y.o. female is S/P resection left total knee and placement of antibiotic spacer on 11/27/2015.  She has been treated with IV antibiotics and been followed by Dr. Megan Salon.   She had a reaction with Vancomycin, but continued her cephalosporin.  She continues to do well and there is no sign of infection. To date not growth with her cultures.  Discussed options with Dr. Alvan Dame and she is schedule for a reimplantation of the left TKA.  Risks, benefits and expectations were discussed with the patient.  Risks including but not limited to the risk of anesthesia, blood clots, nerve damage, blood vessel damage, failure of the prosthesis, infection and up to and including death.  Patient understand the risks, benefits and expectations and wishes to proceed with surgery.   PCP: Horatio Pel, MD  D/C Plans:      Home with HHPT  Post-op Meds:       No Rx given  Tranexamic Acid:      To be given - IV   Decadron:      is to be given  FYI:     ASA  Norco   PMH: Past Medical History  Diagnosis Date  . Hypertension   . Normal cardiac stress test 08-10-2004  . Bladder tumor     benign  . Blood transfusion   . Urgency of urination   . Arthritis KNEES AND HANDS  . DDD (degenerative disc disease), lumbar   . DDD (degenerative disc disease)   . Anxiety   . OSA on CPAP     uses cpap, pt does not know settings    PSH: Past Surgical History  Procedure Laterality Date  . Knee arthroscopy  04-02-2010    RIGHT  . Ganglion cyst excision      LEFT WRIST  . Left shoulder arthroscopy/ debridement labrum/ sad/ open distal clavicle resection  03-25-2011  . Rotator cuff repair  12-23-2008    RIGHT-- W/ TISSUEMEND GRAFT  . Total knee  arthroplasty  09-16-2007    LEFT  . Knee arthroscopy w/ meniscectomy  02-20-2006    LEFT  . Cesarean section      X2  . Anterior cruciate ligament repair      LEFT  . Dilation and curettage of uterus      FOR MISSED AB  . Tubal ligation    . Breast surgery  07/10/11    left breast excisional biopsy-- benign  . Tonsillectomy  CHILD  . Transurethral resection of bladder tumor  12/27/2011    Procedure: TRANSURETHRAL RESECTION OF BLADDER TUMOR (TURBT);  Surgeon: Claybon Jabs, MD;  Location: [facility];  Service: Urology;  Laterality: N/A;  mytomicin c  gyrus   . Total knee arthroplasty Right 11/18/2013    Procedure: RIGHT TOTAL KNEE ARTHROPLASTY;  Surgeon: Tobi Bastos, MD;  Location: [facility];  Service: Orthopedics;  Laterality: Right;  . Mass excision Right 06/08/2015    Procedure: EXCISION OF MUCOID CYST RIGHT MIDDLE FINGER;  Surgeon: Daryll Brod, MD;  Location: [facility];  Service: Orthopedics;  Laterality: Right;  . Distal interphalangeal joint fusion Right 06/08/2015    Procedure: ARTHROTOMY RIGHT MIDDLE FINGER;  Surgeon: Daryll Brod, MD;  Location: [facility];  Service: Orthopedics;  Laterality: Right;  . Abdominal hysterectomy  1994    W/ LSO  . Left foot bone spur removed and tendon release plate inserted  sept 2016  . Total knee revision Left 11/27/2015    Procedure: resection left total knee placement of spacers;  Surgeon: Paralee Cancel, MD;  Location: [facility];  Service: Orthopedics;  Laterality: Left;    Social History:  reports that she quit smoking about 35 years ago. Her smoking use included Cigarettes. She has a 12.5 pack-year smoking history. She has never used smokeless tobacco. She reports that she drinks about 0.6 oz of alcohol per week. She reports that she does not use illicit drugs.  Allergies:  Allergies  Allergen Reactions  . Vancomycin Rash    Medications: No current facility-administered medications for this  encounter.   Current Outpatient Prescriptions  Medication Sig Dispense Refill  . Amlodipine-Valsartan-HCTZ (EXFORGE HCT) 10-320-25 MG TABS Take 1 tablet by mouth every morning.    . celecoxib (CELEBREX) 200 MG capsule Take 200 mg by mouth every morning.     . cholecalciferol (VITAMIN D) 1000 units tablet Take 1,000 Units by mouth every morning.    . estrogens, conjugated, (PREMARIN) 0.45 MG tablet Take 0.45 mg by mouth every morning. Reported on 01/01/2016    . Multiple Vitamin (MULTIVITAMIN WITH MINERALS) TABS tablet Take 1 tablet by mouth every morning.    Marland Kitchen oxyCODONE (OXY IR/ROXICODONE) 5 MG immediate release tablet Take 1-3 tablets (5-15 mg total) by mouth every 4 (four) hours. (Patient taking differently: Take 5 mg by mouth every 6 (six) hours as needed for moderate pain. ) 120 tablet 0  . sertraline (ZOLOFT) 50 MG tablet Take 50 mg by mouth every morning.    . zolpidem (AMBIEN CR) 12.5 MG CR tablet Take 12.5 mg by mouth at bedtime as needed for sleep.     Marland Kitchen acetaminophen (TYLENOL) 325 MG tablet Take 1-2 tablets (325-650 mg total) by mouth every 6 (six) hours as needed for mild pain or moderate pain. (Patient not taking: Reported on 01/01/2016)    . aspirin 325 MG EC tablet Take 325 mg by mouth daily.    Marland Kitchen docusate sodium (COLACE) 100 MG capsule Take 1 capsule (100 mg total) by mouth 2 (two) times daily. (Patient not taking: Reported on 01/01/2016) 10 capsule 0  . methocarbamol (ROBAXIN) 500 MG tablet Take 1 tablet (500 mg total) by mouth every 6 (six) hours as needed for muscle spasms. (Patient not taking: Reported on 01/01/2016) 40 tablet 0  . ondansetron (ZOFRAN) 4 MG tablet Take 1 tablet (4 mg total) by mouth every 8 (eight) hours as needed for nausea or vomiting. (Patient not taking: Reported on 01/01/2016) 60 tablet 1  . oxyCODONE (OXY IR/ROXICODONE) 5 MG immediate release tablet Take 5 mg by mouth every 4 (four) hours as needed for severe pain.    . polyethylene glycol (MIRALAX / GLYCOLAX)  packet Take 17 g by mouth 2 (two) times daily. (Patient not taking: Reported on 01/01/2016) 14 each 0    Results for orders placed or performed during the hospital encounter of 01/08/16 (from the past 48 hour(s))  Urinalysis, Routine w reflex microscopic (not at [facility])     Status: Abnormal   Collection Time: 01/08/16  8:15 AM  Result Value Ref Range   Color, Urine YELLOW YELLOW   APPearance CLEAR CLEAR   Specific Gravity, Urine 1.010 1.005 - 1.030   pH 6.5 5.0 -  8.0   Glucose, UA NEGATIVE NEGATIVE mg/dL   Hgb urine dipstick NEGATIVE NEGATIVE   Bilirubin Urine NEGATIVE NEGATIVE   Ketones, ur NEGATIVE NEGATIVE mg/dL   Protein, ur NEGATIVE NEGATIVE mg/dL   Nitrite NEGATIVE NEGATIVE   Leukocytes, UA TRACE (A) NEGATIVE  Surgical pcr screen     Status: None   Collection Time: 01/08/16  8:15 AM  Result Value Ref Range   MRSA, PCR NEGATIVE NEGATIVE   Staphylococcus aureus NEGATIVE NEGATIVE    Comment:        The Xpert SA Assay (FDA approved for NASAL specimens in patients over 47 years of age), is one component of a comprehensive surveillance program.  Test performance has been validated by [facility] for patients greater than or equal to 93 year old. It is not intended to diagnose infection nor to guide or monitor treatment.   Urine microscopic-add on     Status: Abnormal   Collection Time: 01/08/16  8:15 AM  Result Value Ref Range   Squamous Epithelial / LPF 0-5 (A) NONE SEEN   WBC, UA 0-5 0 - 5 WBC/hpf   RBC / HPF 0-5 0 - 5 RBC/hpf   Bacteria, UA RARE (A) NONE SEEN  APTT     Status: None   Collection Time: 01/08/16  8:30 AM  Result Value Ref Range   aPTT 32 24 - 37 seconds  Basic metabolic panel     Status: Abnormal   Collection Time: 01/08/16  8:30 AM  Result Value Ref Range   Sodium 143 135 - 145 mmol/L   Potassium 4.1 3.5 - 5.1 mmol/L   Chloride 105 101 - 111 mmol/L   CO2 29 22 - 32 mmol/L   Glucose, Bld 73 65 - 99 mg/dL   BUN 25 (H) 6 - 20 mg/dL   Creatinine, Ser  0.66 0.44 - 1.00 mg/dL   Calcium 10.0 8.9 - 10.3 mg/dL   GFR calc non Af Amer >60 >60 mL/min   GFR calc Af Amer >60 >60 mL/min    Comment: (NOTE) The eGFR has been calculated using the CKD EPI equation. This calculation has not been validated in all clinical situations. eGFR's persistently <60 mL/min signify possible Chronic Kidney Disease.    Anion gap 9 5 - 15  CBC     Status: Abnormal   Collection Time: 01/08/16  8:30 AM  Result Value Ref Range   WBC 7.4 4.0 - 10.5 K/uL   RBC 3.79 (L) 3.87 - 5.11 MIL/uL   Hemoglobin 10.8 (L) 12.0 - 15.0 g/dL   HCT 33.6 (L) 36.0 - 46.0 %   MCV 88.7 78.0 - 100.0 fL   MCH 28.5 26.0 - 34.0 pg   MCHC 32.1 30.0 - 36.0 g/dL   RDW 14.3 11.5 - 15.5 %   Platelets 248 150 - 400 K/uL  Protime-INR     Status: None   Collection Time: 01/08/16  8:30 AM  Result Value Ref Range   Prothrombin Time 13.1 11.6 - 15.2 seconds   INR 1.01 0.00 - 1.49  Type and screen Order type and screen if day of surgery is less than 15 days from draw of preadmission visit or order morning of surgery if day of surgery is greater than 6 days from preadmission visit.     Status: None   Collection Time: 01/08/16  8:30 AM  Result Value Ref Range   ABO/RH(D) A POS    Antibody Screen NEG    Sample Expiration 01/22/2016  Extend sample reason NO TRANSFUSIONS OR PREGNANCY IN THE PAST 3 MONTHS      Review of Systems  Constitutional: Negative.   HENT: Negative.   Eyes: Negative.   Respiratory: Negative.   Cardiovascular: Negative.   Gastrointestinal: Negative.   Genitourinary: Positive for urgency.  Musculoskeletal: Positive for joint pain.  Skin: Negative.   Neurological: Negative.   Endo/Heme/Allergies: Negative.   Psychiatric/Behavioral: The patient is nervous/anxious.        Physical Exam  Constitutional: She is oriented to person, place, and time. She appears well-developed.  HENT:  Head: Normocephalic.  Eyes: Pupils are equal, round, and reactive to light.    Neck: Neck supple. No JVD present. No tracheal deviation present. No thyromegaly present.  Cardiovascular: Normal rate, regular rhythm, normal heart sounds and intact distal pulses.   Respiratory: Effort normal and breath sounds normal. No stridor. No respiratory distress. She has no wheezes.  GI: Soft. There is no tenderness. There is no guarding.  Musculoskeletal:       Left knee: She exhibits decreased range of motion, laceration (healed previous incision) and bony tenderness. She exhibits no effusion, no ecchymosis, no deformity and no erythema. Tenderness found.  Lymphadenopathy:    She has no cervical adenopathy.  Neurological: She is alert and oriented to person, place, and time.  Skin: Skin is warm and dry.  Psychiatric: She has a normal mood and affect.       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 Alvan Dame at [facility]. Risks benefits and expectations were discussed with the patient. Patient understand risks, benefits and expectations and wishes to proceed.     [place] Pugh Jules Baty   PA-C  01/09/2016, 3:36 PM

## 2016-01-15 ENCOUNTER — Encounter: Payer: Self-pay | Admitting: Internal Medicine

## 2016-01-15 ENCOUNTER — Inpatient Hospital Stay (HOSPITAL_COMMUNITY): Payer: PPO | Admitting: Registered Nurse

## 2016-01-15 ENCOUNTER — Encounter (HOSPITAL_COMMUNITY)
Admission: RE | Disposition: A | Discharge status: Home Health | Payer: Self-pay | Source: Ambulatory Visit | Attending: Orthopedic Surgery

## 2016-01-15 ENCOUNTER — Encounter (HOSPITAL_COMMUNITY): Payer: Self-pay | Admitting: *Deleted

## 2016-01-15 ENCOUNTER — Inpatient Hospital Stay (HOSPITAL_COMMUNITY)
Admission: RE | Admit: 2016-01-15 | Discharge: 2016-01-17 | DRG: 467 | Disposition: A | Discharge status: Home Health | Payer: PPO | Source: Ambulatory Visit | Attending: Orthopedic Surgery | Admitting: Orthopedic Surgery

## 2016-01-15 DIAGNOSIS — Z96651 Presence of right artificial knee joint: Secondary | ICD-10-CM | POA: Diagnosis not present

## 2016-01-15 DIAGNOSIS — Z96652 Presence of left artificial knee joint: Secondary | ICD-10-CM | POA: Diagnosis not present

## 2016-01-15 DIAGNOSIS — T84093A Other mechanical complication of internal left knee prosthesis, initial encounter: Secondary | ICD-10-CM | POA: Diagnosis not present

## 2016-01-15 DIAGNOSIS — T8454XA Infection and inflammatory reaction due to internal left knee prosthesis, initial encounter: Secondary | ICD-10-CM | POA: Diagnosis not present

## 2016-01-15 DIAGNOSIS — Y792 Prosthetic and other implants, materials and accessory orthopedic devices associated with adverse incidents: Secondary | ICD-10-CM | POA: Diagnosis present

## 2016-01-15 DIAGNOSIS — Z01812 Encounter for preprocedural laboratory examination: Secondary | ICD-10-CM | POA: Diagnosis not present

## 2016-01-15 DIAGNOSIS — Z7982 Long term (current) use of aspirin: Secondary | ICD-10-CM

## 2016-01-15 DIAGNOSIS — Z6827 Body mass index (BMI) 27.0-27.9, adult: Secondary | ICD-10-CM

## 2016-01-15 DIAGNOSIS — G4733 Obstructive sleep apnea (adult) (pediatric): Secondary | ICD-10-CM | POA: Diagnosis present

## 2016-01-15 DIAGNOSIS — Z87891 Personal history of nicotine dependence: Secondary | ICD-10-CM

## 2016-01-15 DIAGNOSIS — M1712 Unilateral primary osteoarthritis, left knee: Secondary | ICD-10-CM | POA: Diagnosis not present

## 2016-01-15 DIAGNOSIS — I10 Essential (primary) hypertension: Secondary | ICD-10-CM | POA: Diagnosis present

## 2016-01-15 DIAGNOSIS — Z96659 Presence of unspecified artificial knee joint: Secondary | ICD-10-CM

## 2016-01-15 DIAGNOSIS — D62 Acute posthemorrhagic anemia: Secondary | ICD-10-CM | POA: Diagnosis not present

## 2016-01-15 DIAGNOSIS — F419 Anxiety disorder, unspecified: Secondary | ICD-10-CM | POA: Diagnosis not present

## 2016-01-15 DIAGNOSIS — E663 Overweight: Secondary | ICD-10-CM | POA: Diagnosis present

## 2016-01-15 HISTORY — PX: REIMPLANTATION OF TOTAL KNEE: SHX6052

## 2016-01-15 SURGERY — REVISION, TOTAL ARTHROPLASTY, KNEE
Anesthesia: General | Site: Knee | Laterality: Left

## 2016-01-15 MED ORDER — CEFAZOLIN SODIUM-DEXTROSE 2-3 GM-% IV SOLR
INTRAVENOUS | Status: AC
  Filled 2016-01-15: qty 50

## 2016-01-15 MED ORDER — AMLODIPINE-VALSARTAN-HCTZ 10-320-25 MG PO TABS: 1.0000 | ORAL_TABLET | Freq: Every morning | ORAL | Status: DC

## 2016-01-15 MED ORDER — AMLODIPINE BESYLATE 10 MG PO TABS
10.0000 mg | ORAL_TABLET | Freq: Every day | ORAL | Status: DC
  Administered 2016-01-17: 10 mg via ORAL
  Filled 2016-01-15 (×3): qty 1

## 2016-01-15 MED ORDER — FERROUS SULFATE 325 (65 FE) MG PO TABS
325.0000 mg | ORAL_TABLET | Freq: Three times a day (TID) | ORAL | Status: DC
  Administered 2016-01-16 – 2016-01-17 (×4): 325 mg via ORAL
  Filled 2016-01-15 (×10): qty 1

## 2016-01-15 MED ORDER — METHOCARBAMOL 500 MG PO TABS
500.0000 mg | ORAL_TABLET | Freq: Four times a day (QID) | ORAL | Status: DC | PRN
  Administered 2016-01-16 – 2016-01-17 (×4): 500 mg via ORAL
  Filled 2016-01-15 (×4): qty 1

## 2016-01-15 MED ORDER — OXYCODONE HCL 5 MG PO TABS
5.0000 mg | ORAL_TABLET | ORAL | Status: DC
  Administered 2016-01-15 – 2016-01-17 (×10): 15 mg via ORAL
  Filled 2016-01-15 (×11): qty 3

## 2016-01-15 MED ORDER — IRBESARTAN 300 MG PO TABS
300.0000 mg | ORAL_TABLET | Freq: Every day | ORAL | Status: DC
  Administered 2016-01-16 – 2016-01-17 (×2): 300 mg via ORAL
  Filled 2016-01-15 (×3): qty 1

## 2016-01-15 MED ORDER — MAGNESIUM CITRATE PO SOLN: 1.0000 | Freq: Once | ORAL | Status: DC | PRN

## 2016-01-15 MED ORDER — KETOROLAC TROMETHAMINE 30 MG/ML IJ SOLN
INTRAMUSCULAR | Status: DC | PRN
  Administered 2016-01-15: 30 mg via INTRAVENOUS

## 2016-01-15 MED ORDER — SUFENTANIL CITRATE 50 MCG/ML IV SOLN
INTRAVENOUS | Status: DC | PRN
  Administered 2016-01-15 (×4): 10 ug via INTRAVENOUS
  Administered 2016-01-15: 5 ug via INTRAVENOUS
  Administered 2016-01-15: 15 ug via INTRAVENOUS

## 2016-01-15 MED ORDER — METHOCARBAMOL 1000 MG/10ML IJ SOLN
500.0000 mg | Freq: Four times a day (QID) | INTRAVENOUS | Status: DC | PRN
  Administered 2016-01-15: 500 mg via INTRAVENOUS
  Filled 2016-01-15 (×2): qty 5

## 2016-01-15 MED ORDER — LACTATED RINGERS IV SOLN
INTRAVENOUS | Status: DC | PRN
  Administered 2016-01-15 (×3): via INTRAVENOUS

## 2016-01-15 MED ORDER — MIDAZOLAM HCL 2 MG/2ML IJ SOLN
INTRAMUSCULAR | Status: AC
  Filled 2016-01-15: qty 2

## 2016-01-15 MED ORDER — POTASSIUM CHLORIDE 2 MEQ/ML IV SOLN
INTRAVENOUS | Status: DC
  Administered 2016-01-15 – 2016-01-16 (×2): via INTRAVENOUS
  Filled 2016-01-15 (×7): qty 1000

## 2016-01-15 MED ORDER — TRANEXAMIC ACID 1000 MG/10ML IV SOLN
1000.0000 mg | Freq: Once | INTRAVENOUS | Status: AC
  Administered 2016-01-15: 1000 mg via INTRAVENOUS
  Filled 2016-01-15: qty 10

## 2016-01-15 MED ORDER — MEPERIDINE HCL 50 MG/ML IJ SOLN: 6.2500 mg | INTRAMUSCULAR | Status: DC | PRN

## 2016-01-15 MED ORDER — GLYCOPYRROLATE 0.2 MG/ML IJ SOLN
INTRAMUSCULAR | Status: AC
  Filled 2016-01-15: qty 2

## 2016-01-15 MED ORDER — PROPOFOL 10 MG/ML IV BOLUS
INTRAVENOUS | Status: AC
  Filled 2016-01-15: qty 20

## 2016-01-15 MED ORDER — AMLODIPINE BESYLATE 10 MG PO TABS
10.0000 mg | ORAL_TABLET | Freq: Once | ORAL | Status: AC
  Administered 2016-01-15: 10 mg via ORAL
  Filled 2016-01-15 (×2): qty 1

## 2016-01-15 MED ORDER — MENTHOL 3 MG MT LOZG: 1.0000 | LOZENGE | OROMUCOSAL | Status: DC | PRN

## 2016-01-15 MED ORDER — ROCURONIUM BROMIDE 100 MG/10ML IV SOLN
INTRAVENOUS | Status: AC
  Filled 2016-01-15: qty 1

## 2016-01-15 MED ORDER — DOCUSATE SODIUM 100 MG PO CAPS
100.0000 mg | ORAL_CAPSULE | Freq: Two times a day (BID) | ORAL | Status: DC
  Administered 2016-01-15 – 2016-01-17 (×4): 100 mg via ORAL

## 2016-01-15 MED ORDER — GLYCOPYRROLATE 0.2 MG/ML IJ SOLN
INTRAMUSCULAR | Status: DC | PRN
  Administered 2016-01-15: 0.4 mg via INTRAVENOUS

## 2016-01-15 MED ORDER — KETOROLAC TROMETHAMINE 30 MG/ML IJ SOLN
INTRAMUSCULAR | Status: AC
  Filled 2016-01-15: qty 1

## 2016-01-15 MED ORDER — ONDANSETRON HCL 4 MG PO TABS: 4.0000 mg | ORAL_TABLET | Freq: Four times a day (QID) | ORAL | Status: DC | PRN

## 2016-01-15 MED ORDER — PHENOL 1.4 % MT LIQD
1.0000 | OROMUCOSAL | Status: DC | PRN
Start: 2016-01-15 — End: 2016-01-17

## 2016-01-15 MED ORDER — SUCCINYLCHOLINE CHLORIDE 20 MG/ML IJ SOLN
INTRAMUSCULAR | Status: DC | PRN
  Administered 2016-01-15: 100 mg via INTRAVENOUS

## 2016-01-15 MED ORDER — HYDROMORPHONE HCL 1 MG/ML IJ SOLN
0.5000 mg | INTRAMUSCULAR | Status: DC | PRN
  Administered 2016-01-15: 2 mg via INTRAVENOUS
  Administered 2016-01-15 – 2016-01-16 (×2): 1 mg via INTRAVENOUS
  Filled 2016-01-15: qty 2
  Filled 2016-01-15 (×2): qty 1

## 2016-01-15 MED ORDER — DEXAMETHASONE SODIUM PHOSPHATE 10 MG/ML IJ SOLN
10.0000 mg | Freq: Once | INTRAMUSCULAR | Status: AC
  Administered 2016-01-16: 10 mg via INTRAVENOUS
  Filled 2016-01-15: qty 1

## 2016-01-15 MED ORDER — PROPOFOL 10 MG/ML IV BOLUS
INTRAVENOUS | Status: AC
  Filled 2016-01-15: qty 60

## 2016-01-15 MED ORDER — METOCLOPRAMIDE HCL 5 MG/ML IJ SOLN: 5.0000 mg | Freq: Three times a day (TID) | INTRAMUSCULAR | Status: DC | PRN

## 2016-01-15 MED ORDER — NEOSTIGMINE METHYLSULFATE 10 MG/10ML IV SOLN
INTRAVENOUS | Status: AC
  Filled 2016-01-15: qty 1

## 2016-01-15 MED ORDER — MIDAZOLAM HCL 5 MG/5ML IJ SOLN
INTRAMUSCULAR | Status: DC | PRN
  Administered 2016-01-15: 2 mg via INTRAVENOUS

## 2016-01-15 MED ORDER — DEXAMETHASONE SODIUM PHOSPHATE 10 MG/ML IJ SOLN
INTRAMUSCULAR | Status: AC
  Filled 2016-01-15: qty 1

## 2016-01-15 MED ORDER — SODIUM CHLORIDE 0.9 % IR SOLN
Status: DC | PRN
  Administered 2016-01-15: 1000 mL

## 2016-01-15 MED ORDER — METOCLOPRAMIDE HCL 10 MG PO TABS: 5.0000 mg | ORAL_TABLET | Freq: Three times a day (TID) | ORAL | Status: DC | PRN

## 2016-01-15 MED ORDER — BUPIVACAINE-EPINEPHRINE (PF) 0.25% -1:200000 IJ SOLN
INTRAMUSCULAR | Status: AC
  Filled 2016-01-15: qty 30

## 2016-01-15 MED ORDER — HYDROMORPHONE HCL 1 MG/ML IJ SOLN
INTRAMUSCULAR | Status: AC
  Administered 2016-01-15: 1 mg via INTRAVENOUS
  Filled 2016-01-15: qty 1

## 2016-01-15 MED ORDER — DEXAMETHASONE SODIUM PHOSPHATE 10 MG/ML IJ SOLN
10.0000 mg | Freq: Once | INTRAMUSCULAR | Status: AC
Start: 2016-01-15 — End: 2016-01-15
  Administered 2016-01-15: 10 mg via INTRAVENOUS

## 2016-01-15 MED ORDER — SODIUM CHLORIDE 0.9 % IJ SOLN
INTRAMUSCULAR | Status: AC
  Filled 2016-01-15: qty 20

## 2016-01-15 MED ORDER — HYDROMORPHONE HCL 1 MG/ML IJ SOLN
INTRAMUSCULAR | Status: DC | PRN
  Administered 2016-01-15 (×2): 1 mg via INTRAVENOUS

## 2016-01-15 MED ORDER — PHENYLEPHRINE 40 MCG/ML (10ML) SYRINGE FOR IV PUSH (FOR BLOOD PRESSURE SUPPORT)
PREFILLED_SYRINGE | INTRAVENOUS | Status: AC
  Filled 2016-01-15: qty 10

## 2016-01-15 MED ORDER — CELECOXIB 200 MG PO CAPS
200.0000 mg | ORAL_CAPSULE | Freq: Two times a day (BID) | ORAL | Status: DC
  Administered 2016-01-15 – 2016-01-17 (×4): 200 mg via ORAL
  Filled 2016-01-15 (×8): qty 1

## 2016-01-15 MED ORDER — ZOLPIDEM TARTRATE 5 MG PO TABS
5.0000 mg | ORAL_TABLET | Freq: Every evening | ORAL | Status: DC | PRN
  Administered 2016-01-15 – 2016-01-17 (×3): 5 mg via ORAL
  Filled 2016-01-15 (×3): qty 1

## 2016-01-15 MED ORDER — CEFAZOLIN SODIUM-DEXTROSE 2-4 GM/100ML-% IV SOLN
2.0000 g | Freq: Four times a day (QID) | INTRAVENOUS | Status: AC
  Administered 2016-01-15 – 2016-01-16 (×2): 2 g via INTRAVENOUS
  Filled 2016-01-15 (×2): qty 100

## 2016-01-15 MED ORDER — CEFAZOLIN SODIUM-DEXTROSE 2-4 GM/100ML-% IV SOLN
2.0000 g | INTRAVENOUS | Status: AC
  Administered 2016-01-15: 2 g via INTRAVENOUS
  Filled 2016-01-15: qty 100

## 2016-01-15 MED ORDER — ASPIRIN EC 325 MG PO TBEC
325.0000 mg | DELAYED_RELEASE_TABLET | Freq: Two times a day (BID) | ORAL | Status: DC
  Administered 2016-01-16 – 2016-01-17 (×3): 325 mg via ORAL
  Filled 2016-01-15 (×7): qty 1

## 2016-01-15 MED ORDER — ONDANSETRON HCL 4 MG/2ML IJ SOLN
INTRAMUSCULAR | Status: AC
  Filled 2016-01-15: qty 2

## 2016-01-15 MED ORDER — ONDANSETRON HCL 4 MG/2ML IJ SOLN
INTRAMUSCULAR | Status: DC | PRN
  Administered 2016-01-15: 4 mg via INTRAVENOUS

## 2016-01-15 MED ORDER — ONDANSETRON HCL 4 MG/2ML IJ SOLN: 4.0000 mg | Freq: Four times a day (QID) | INTRAMUSCULAR | Status: DC | PRN

## 2016-01-15 MED ORDER — HYDROMORPHONE HCL 1 MG/ML IJ SOLN
INTRAMUSCULAR | Status: AC
  Filled 2016-01-15: qty 1

## 2016-01-15 MED ORDER — ROCURONIUM BROMIDE 100 MG/10ML IV SOLN
INTRAVENOUS | Status: DC | PRN
  Administered 2016-01-15: 5 mg via INTRAVENOUS
  Administered 2016-01-15: 35 mg via INTRAVENOUS

## 2016-01-15 MED ORDER — PROPOFOL 10 MG/ML IV BOLUS
INTRAVENOUS | Status: DC | PRN
  Administered 2016-01-15: 50 mg via INTRAVENOUS
  Administered 2016-01-15: 170 mg via INTRAVENOUS

## 2016-01-15 MED ORDER — SODIUM CHLORIDE 0.9 % IJ SOLN
INTRAMUSCULAR | Status: AC
  Filled 2016-01-15: qty 50

## 2016-01-15 MED ORDER — FENTANYL CITRATE (PF) 100 MCG/2ML IJ SOLN
INTRAMUSCULAR | Status: DC | PRN
  Administered 2016-01-15: 100 ug via INTRAVENOUS

## 2016-01-15 MED ORDER — LABETALOL HCL 5 MG/ML IV SOLN
INTRAVENOUS | Status: AC
  Filled 2016-01-15: qty 4

## 2016-01-15 MED ORDER — HYDROCHLOROTHIAZIDE 25 MG PO TABS
25.0000 mg | ORAL_TABLET | Freq: Every day | ORAL | Status: DC
  Administered 2016-01-16 – 2016-01-17 (×2): 25 mg via ORAL
  Filled 2016-01-15 (×3): qty 1

## 2016-01-15 MED ORDER — PHENYLEPHRINE HCL 10 MG/ML IJ SOLN
INTRAMUSCULAR | Status: DC | PRN
  Administered 2016-01-15: 80 ug via INTRAVENOUS

## 2016-01-15 MED ORDER — DIPHENHYDRAMINE HCL 25 MG PO CAPS: 25.0000 mg | ORAL_CAPSULE | Freq: Four times a day (QID) | ORAL | Status: DC | PRN

## 2016-01-15 MED ORDER — CHLORHEXIDINE GLUCONATE 4 % EX LIQD: 60.0000 mL | Freq: Once | CUTANEOUS | Status: DC

## 2016-01-15 MED ORDER — HYDROMORPHONE HCL 1 MG/ML IJ SOLN
0.2500 mg | INTRAMUSCULAR | Status: DC | PRN
  Administered 2016-01-15 (×4): 0.5 mg via INTRAVENOUS

## 2016-01-15 MED ORDER — POLYETHYLENE GLYCOL 3350 17 G PO PACK
17.0000 g | PACK | Freq: Two times a day (BID) | ORAL | Status: DC
  Administered 2016-01-15 – 2016-01-17 (×3): 17 g via ORAL

## 2016-01-15 MED ORDER — SERTRALINE HCL 50 MG PO TABS
50.0000 mg | ORAL_TABLET | Freq: Every morning | ORAL | Status: DC
  Administered 2016-01-16 – 2016-01-17 (×2): 50 mg via ORAL
  Filled 2016-01-15 (×3): qty 1

## 2016-01-15 MED ORDER — ALUM & MAG HYDROXIDE-SIMETH 200-200-20 MG/5ML PO SUSP: 30.0000 mL | ORAL | Status: DC | PRN

## 2016-01-15 MED ORDER — BUPIVACAINE-EPINEPHRINE (PF) 0.25% -1:200000 IJ SOLN
INTRAMUSCULAR | Status: DC | PRN
  Administered 2016-01-15: 20 mL via PERINEURAL

## 2016-01-15 MED ORDER — BISACODYL 10 MG RE SUPP: 10.0000 mg | Freq: Every day | RECTAL | Status: DC | PRN

## 2016-01-15 MED ORDER — HYDROMORPHONE HCL 2 MG/ML IJ SOLN
INTRAMUSCULAR | Status: AC
  Filled 2016-01-15: qty 1

## 2016-01-15 MED ORDER — LABETALOL HCL 5 MG/ML IV SOLN
INTRAVENOUS | Status: DC | PRN
  Administered 2016-01-15: 2.5 mg via INTRAVENOUS
  Administered 2016-01-15: 5 mg via INTRAVENOUS

## 2016-01-15 MED ORDER — NEOSTIGMINE METHYLSULFATE 10 MG/10ML IV SOLN
INTRAVENOUS | Status: DC | PRN
  Administered 2016-01-15: 3 mg via INTRAVENOUS

## 2016-01-15 MED ORDER — SUFENTANIL CITRATE 50 MCG/ML IV SOLN
INTRAVENOUS | Status: AC
  Filled 2016-01-15: qty 1

## 2016-01-15 MED ORDER — LACTATED RINGERS IV SOLN: INTRAVENOUS | Status: DC

## 2016-01-15 MED ORDER — FENTANYL CITRATE (PF) 100 MCG/2ML IJ SOLN
INTRAMUSCULAR | Status: AC
  Filled 2016-01-15: qty 2

## 2016-01-15 SURGICAL SUPPLY — 70 items
ADAPTER BOLT FEMORAL +2/-2 (Knees) ×2 IMPLANT
AUGMENT FEM SZ2 4 LT DIST PFC (Knees) ×2 IMPLANT
BAG ZIPLOCK 12X15 (MISCELLANEOUS) ×2 IMPLANT
BANDAGE ACE 6X5 VEL STRL LF (GAUZE/BANDAGES/DRESSINGS) ×2 IMPLANT
BANDAGE ELASTIC 6 VELCRO ST LF (GAUZE/BANDAGES/DRESSINGS) ×2 IMPLANT
BANDAGE ESMARK 6X9 LF (GAUZE/BANDAGES/DRESSINGS) ×1 IMPLANT
BLADE SAW SGTL 13.0X1.19X90.0M (BLADE) ×2 IMPLANT
BLADE SAW SGTL 81X20 HD (BLADE) ×2 IMPLANT
BNDG ESMARK 6X9 LF (GAUZE/BANDAGES/DRESSINGS) ×2
BONE CEMENT GENTAMICIN (Cement) ×6 IMPLANT
BRUSH FEMORAL CANAL (MISCELLANEOUS) ×2 IMPLANT
CEMENT BONE GENTAMICIN 40 (Cement) ×3 IMPLANT
CEMENT RESTRICTOR DEPUY SZ 4 (Cement) ×4 IMPLANT
COMP FEM CEM PS LT SZ 2 (Knees) ×2 IMPLANT
COMPONENT FEM CEM PS LT SZ 2 (Knees) ×1 IMPLANT
CUFF TOURN SGL QUICK 34 (TOURNIQUET CUFF) ×1
CUFF TRNQT CYL 34X4X40X1 (TOURNIQUET CUFF) ×1 IMPLANT
DRAPE EXTREMITY T 121X128X90 (DRAPE) ×2 IMPLANT
DRAPE POUCH INSTRU U-SHP 10X18 (DRAPES) ×2 IMPLANT
DRAPE U-SHAPE 47X51 STRL (DRAPES) ×2 IMPLANT
DRSG AQUACEL AG ADV 3.5X10 (GAUZE/BANDAGES/DRESSINGS) ×2 IMPLANT
DRSG PAD ABDOMINAL 8X10 ST (GAUZE/BANDAGES/DRESSINGS) ×2 IMPLANT
DURAPREP 26ML APPLICATOR (WOUND CARE) ×2 IMPLANT
ELECT REM PT RETURN 9FT ADLT (ELECTROSURGICAL) ×2
ELECTRODE REM PT RTRN 9FT ADLT (ELECTROSURGICAL) ×1 IMPLANT
FACESHIELD WRAPAROUND (MASK) ×10 IMPLANT
FEMORAL ADAPTER (Orthopedic Implant) ×2 IMPLANT
GAUZE SPONGE 4X4 12PLY STRL (GAUZE/BANDAGES/DRESSINGS) ×4 IMPLANT
GAUZE XEROFORM 5X9 LF (GAUZE/BANDAGES/DRESSINGS) ×2 IMPLANT
GLOVE BIOGEL M 7.0 STRL (GLOVE) ×6 IMPLANT
GLOVE BIOGEL PI IND STRL 7.5 (GLOVE) ×1 IMPLANT
GLOVE BIOGEL PI IND STRL 8.5 (GLOVE) ×1 IMPLANT
GLOVE BIOGEL PI INDICATOR 7.5 (GLOVE) ×1
GLOVE BIOGEL PI INDICATOR 8.5 (GLOVE) ×1
GLOVE ECLIPSE 8.0 STRL XLNG CF (GLOVE) ×2 IMPLANT
GLOVE ORTHO TXT STRL SZ7.5 (GLOVE) ×4 IMPLANT
GOWN STRL REUS W/TWL LRG LVL3 (GOWN DISPOSABLE) ×2 IMPLANT
GOWN STRL REUS W/TWL XL LVL3 (GOWN DISPOSABLE) ×2 IMPLANT
HANDPIECE INTERPULSE COAX TIP (DISPOSABLE) ×1
IMMOBILIZER KNEE 20 (SOFTGOODS)
IMMOBILIZER KNEE 20 THIGH 36 (SOFTGOODS) IMPLANT
INSERT PFC SIG STB SZ 2 12.5MM (Knees) ×2 IMPLANT
LIQUID BAND (GAUZE/BANDAGES/DRESSINGS) ×2 IMPLANT
MANIFOLD NEPTUNE II (INSTRUMENTS) ×2 IMPLANT
NDL SAFETY ECLIPSE 18X1.5 (NEEDLE) ×1 IMPLANT
NEEDLE HYPO 18GX1.5 SHARP (NEEDLE) ×1
NS IRRIG 1000ML POUR BTL (IV SOLUTION) ×2 IMPLANT
PADDING CAST COTTON 6X4 STRL (CAST SUPPLIES) ×4 IMPLANT
PATELLA DOME PFC 38MM (Knees) ×2 IMPLANT
POSITIONER SURGICAL ARM (MISCELLANEOUS) ×2 IMPLANT
SET HNDPC FAN SPRY TIP SCT (DISPOSABLE) ×1 IMPLANT
SET PAD KNEE POSITIONER (MISCELLANEOUS) ×2 IMPLANT
SPONGE LAP 18X18 X RAY DECT (DISPOSABLE) ×2 IMPLANT
STAPLER VISISTAT 35W (STAPLE) IMPLANT
STEM TIBIA PFC 13X60MM (Stem) ×2 IMPLANT
SUCTION FRAZIER HANDLE 12FR (TUBING) ×1
SUCTION TUBE FRAZIER 12FR DISP (TUBING) ×1 IMPLANT
SUT VIC AB 1 CT1 36 (SUTURE) ×6 IMPLANT
SUT VIC AB 2-0 CT1 27 (SUTURE) ×3
SUT VIC AB 2-0 CT1 TAPERPNT 27 (SUTURE) ×3 IMPLANT
SYR 50ML LL SCALE MARK (SYRINGE) ×2 IMPLANT
TOWEL OR 17X26 10 PK STRL BLUE (TOWEL DISPOSABLE) ×2 IMPLANT
TOWER CARTRIDGE SMART MIX (DISPOSABLE) ×2 IMPLANT
TRAY FOLEY W/METER SILVER 14FR (SET/KITS/TRAYS/PACK) ×2 IMPLANT
TRAY FOLEY W/METER SILVER 16FR (SET/KITS/TRAYS/PACK) ×2 IMPLANT
TRAY SLEEVE CEM ML (Knees) ×2 IMPLANT
TRAY TIB SZ 2 REVISION (Knees) ×2 IMPLANT
WATER STERILE IRR 1500ML POUR (IV SOLUTION) ×4 IMPLANT
WRAP KNEE MAXI GEL POST OP (GAUZE/BANDAGES/DRESSINGS) ×2 IMPLANT
YANKAUER SUCT BULB TIP 10FT TU (MISCELLANEOUS) ×2 IMPLANT

## 2016-01-15 NOTE — Transfer of Care (Signed)
Immediate Anesthesia Transfer of Care Note  Patient: [name]  Procedure(s) Performed: Procedure(s): REIMPLANTATION OF LEFT TOTAL KNEE (Left)  Patient Location: PACU  Anesthesia Type:General  Level of Consciousness: awake, alert , oriented and patient cooperative  Airway & Oxygen Therapy: Patient Spontanous Breathing and Patient connected to face mask oxygen  Post-op Assessment: Report given to RN, Post -op Vital signs reviewed and stable and Patient moving all extremities X 4  Post vital signs: Reviewed and stable  Last Vitals:  Filed Vitals:   01/15/16 0935 01/15/16 1539  BP: 124/56 135/66  Pulse: 80 97  Temp: 36.8 C   Resp: 16 11    Complications: No apparent anesthesia complications

## 2016-01-15 NOTE — Anesthesia Procedure Notes (Signed)
Procedure Name: Intubation Date/Time: 01/15/2016 1:13 PM Performed by: Lissa Morales Pre-anesthesia Checklist: Patient identified, Emergency Drugs available, Suction available and Patient being monitored Patient Re-evaluated:Patient Re-evaluated prior to inductionOxygen Delivery Method: Circle System Utilized Preoxygenation: Pre-oxygenation with 100% oxygen Intubation Type: IV induction Ventilation: Mask ventilation without difficulty Laryngoscope Size: Glidescope, Mac and 3 Tube type: Oral Tube size: 7.0 mm Number of attempts: 1 Airway Equipment and Method: Stylet and Oral airway Placement Confirmation: ETT inserted through vocal cords under direct vision,  positive ETCO2 and breath sounds checked- equal and bilateral Secured at: 21 cm Tube secured with: Tape Dental Injury: Teeth and Oropharynx as per pre-operative assessment  Difficulty Due To: Difficulty was anticipated, Difficult Airway- due to anterior larynx and Difficult Airway- due to limited oral opening Future Recommendations: Recommend- induction with short-acting agent, and alternative techniques readily available Comments: Elective use of glidescope.Blade 4  Of glidescope slightly too big and partially obstructing view, Blade 3 for glidescope would have been better, stylet corkscrewwed thru cors without stylet. Bilateral breath sounds and ETCO2... Past intubations required bougie and several attempts

## 2016-01-15 NOTE — Anesthesia Preprocedure Evaluation (Signed)
Anesthesia Evaluation  Patient identified by MRN, date of birth, ID band Patient awake    Reviewed: Allergy & Precautions, NPO status , Patient's Chart, lab work & pertinent test results  History of Anesthesia Complications Negative for: history of anesthetic complications  Airway Mallampati: III  TM Distance: <3 FB Neck ROM: Full    Dental  (+) Teeth Intact   Pulmonary neg shortness of breath, sleep apnea , neg COPD, neg recent URI, former smoker,    breath sounds clear to auscultation       Cardiovascular hypertension, Pt. on medications (-) angina(-) Past MI and (-) CHF  Rhythm:Regular     Neuro/Psych PSYCHIATRIC DISORDERS Anxiety negative neurological ROS     GI/Hepatic negative GI ROS, Neg liver ROS,   Endo/Other  negative endocrine ROS  Renal/GU negative Renal ROS     Musculoskeletal  (+) Arthritis ,   Abdominal   Peds  Hematology  (+) anemia ,   Anesthesia Other Findings   Reproductive/Obstetrics                             Anesthesia Physical Anesthesia Plan  ASA: II  Anesthesia Plan: General   Post-op Pain Management:    Induction: Intravenous  Airway Management Planned: Oral ETT and Video Laryngoscope Planned  Additional Equipment: None  Intra-op Plan:   Post-operative Plan: Extubation in OR  Informed Consent: I have reviewed the patients History and Physical, chart, labs and discussed the procedure including the risks, benefits and alternatives for the proposed anesthesia with the patient or authorized representative who has indicated his/her understanding and acceptance.   Dental advisory given  Plan Discussed with: CRNA and Surgeon  Anesthesia Plan Comments:         Anesthesia Quick Evaluation

## 2016-01-15 NOTE — Brief Op Note (Signed)
01/15/2016  3:32 PM  PATIENT:  [name]  65 y.o. female  PRE-OPERATIVE DIAGNOSIS:  STATUS POST RESECTION OF LEFT TOTAL KNEE, PLACEMENT OF ANTIBIOTIC SPACERS  POST-OPERATIVE DIAGNOSIS: STATUS POST RESECTION OF LEFT TOTAL KNEE, PLACEMENT OF ANTIBIOTIC SPACERS   PROCEDURE:  Procedure(s): REIMPLANTATION OF LEFT TOTAL KNEE (Left)  SURGEON:  Surgeon(s) and Role:    * Paralee Cancel, MD - Primary  PHYSICIAN ASSISTANT: Danae Orleans, PA-C  ANESTHESIA:   general  EBL:  Total I/O In: 2000 [I.V.:2000] Out: 750 [Urine:700; Blood:50]  BLOOD ADMINISTERED:none  DRAINS: none   LOCAL MEDICATIONS USED:  MARCAINE     SPECIMEN:  No Specimen  DISPOSITION OF SPECIMEN:  N/A  COUNTS:  YES  TOURNIQUET:   Total Tourniquet Time Documented: Thigh (Left) - 57 minutes Total: Thigh (Left) - 57 minutes   DICTATION: .Other Dictation: Dictation Number [id number]  PLAN OF CARE: Admit to inpatient   PATIENT DISPOSITION:  PACU - hemodynamically stable.   Delay start of Pharmacological VTE agent (>24hrs) due to surgical blood loss or risk of bleeding: no

## 2016-01-15 NOTE — Anesthesia Postprocedure Evaluation (Signed)
Anesthesia Post Note  Patient: [name]  Procedure(s) Performed: Procedure(s) (LRB): REIMPLANTATION OF LEFT TOTAL KNEE (Left)  Patient location during evaluation: PACU Anesthesia Type: Spinal and MAC Level of consciousness: awake and alert Pain management: pain level controlled Vital Signs Assessment: post-procedure vital signs reviewed and stable Respiratory status: spontaneous breathing and respiratory function stable Cardiovascular status: blood pressure returned to baseline and stable Postop Assessment: spinal receding Anesthetic complications: no    Last Vitals:  Filed Vitals:   01/15/16 1640 01/15/16 1651  BP: 107/56 130/62  Pulse: 89 83  Temp:  36.9 C  Resp: 13 16    Last Pain:  Filed Vitals:   01/15/16 1709  PainSc: [place]

## 2016-01-15 NOTE — Progress Notes (Signed)
Pt brought home unit CPAP machine. CPAP is set up and ready for use. o2 humidity sterile water provided and oxygen extension provided as well. Education given to pt and husband on use of oxygen into flow meter. Told pt if have any issue to please notify RN or RT. Pt is stable at this time no complications noted. Husband at bedside.

## 2016-01-15 NOTE — Interval H&P Note (Signed)
History and Physical Interval Note:  01/15/2016 11:55 AM  [name]  has presented today for surgery, with the diagnosis of STATUS POST RESECTION OF LEFT TOTAL KNEE, PLACEMENT OF ANTIBIOTIC SPACERS  The various methods of treatment have been discussed with the patient and family. After consideration of risks, benefits and other options for treatment, the patient has consented to  Procedure(s): REIMPLANTATION OF LEFT TOTAL KNEE (Left) as a surgical intervention .  The patient's history has been reviewed, patient examined, no change in status, stable for surgery.  I have reviewed the patient's chart and labs.  Questions were answered to the patient's satisfaction.     Mauri Pole

## 2016-01-16 ENCOUNTER — Encounter (HOSPITAL_COMMUNITY): Payer: Self-pay | Admitting: Orthopedic Surgery

## 2016-01-16 LAB — CBC
HCT: 22.2 % — ABNORMAL LOW (ref 36.0–46.0)
HEMOGLOBIN: 7.4 g/dL — AB (ref 12.0–15.0)
MCH: 29.1 pg (ref 26.0–34.0)
MCHC: 33.3 g/dL (ref 30.0–36.0)
MCV: 87.4 fL (ref 78.0–100.0)
Platelets: 205 10*3/uL (ref 150–400)
RBC: 2.54 MIL/uL — ABNORMAL LOW (ref 3.87–5.11)
RDW: 13.9 % (ref 11.5–15.5)
WBC: 9.8 10*3/uL (ref 4.0–10.5)

## 2016-01-16 LAB — BASIC METABOLIC PANEL
Anion gap: 9 (ref 5–15)
BUN: 18 mg/dL (ref 6–20)
CALCIUM: 8.6 mg/dL — AB (ref 8.9–10.3)
CO2: 27 mmol/L (ref 22–32)
CREATININE: 0.8 mg/dL (ref 0.44–1.00)
Chloride: 104 mmol/L (ref 101–111)
GFR calc non Af Amer: >60 mL/min (ref >60)
Glucose, Bld: 142 mg/dL — ABNORMAL HIGH (ref 65–99)
Potassium: 4.3 mmol/L (ref 3.5–5.1)
SODIUM: 140 mmol/L (ref 135–145)

## 2016-01-16 LAB — PREPARE RBC (CROSSMATCH)

## 2016-01-16 MED ORDER — SODIUM CHLORIDE 0.9 % IV SOLN
Freq: Once | INTRAVENOUS | Status: AC
  Administered 2016-01-16 – 2016-01-17 (×2): via INTRAVENOUS

## 2016-01-16 MED ORDER — TRAMADOL HCL 50 MG PO TABS
50.0000 mg | ORAL_TABLET | Freq: Four times a day (QID) | ORAL | Status: DC
  Filled 2016-01-16: qty 2

## 2016-01-16 MED ORDER — TRAMADOL HCL 50 MG PO TABS
50.0000 mg | ORAL_TABLET | Freq: Four times a day (QID) | ORAL | Status: DC | PRN
  Administered 2016-01-16 – 2016-01-17 (×3): 100 mg via ORAL
  Filled 2016-01-16 (×2): qty 2

## 2016-01-16 NOTE — Evaluation (Signed)
Physical Therapy Evaluation Patient Details Name: [name] MRN: [id number] DOB: Mar 18, 1951 Today's Date: 01/16/2016   History of Present Illness  s/p  L TKA reimplantation   Clinical Impression  Pt is s/p TKA resulting in the deficits listed below (see PT Problem List). Pt will benefit from skilled PT to increase their independence and safety with mobility to allow discharge to the venue listed below.      Follow Up Recommendations Home health PT    Equipment Recommendations  None recommended by PT    Recommendations for Other Services       Precautions / Restrictions Precautions Precautions: Fall Restrictions Weight Bearing Restrictions: No Other Position/Activity Restrictions: WBAT      Mobility  Bed Mobility Overal bed mobility: Needs Assistance Bed Mobility: Supine to Sit     Supine to sit: Min guard     General bed mobility comments: incr time, light assist with LLE and then pt self assists LLE with RLE  Transfers Overall transfer level: Needs assistance Equipment used: Rolling walker (2 wheeled) Transfers: Sit to/from Stand Sit to Stand: Supervision;Min guard         General transfer comment: for safety  Ambulation/Gait Ambulation/Gait assistance: Supervision;Min guard Ambulation Distance (Feet): 160 Feet Assistive device: Rolling walker (2 wheeled) Gait Pattern/deviations: Step-to pattern;Antalgic     General Gait Details: cues for sequence, step length  Stairs            Wheelchair Mobility    Modified Rankin (Stroke Patients Only)       Balance                                             Pertinent Vitals/Pain Pain Assessment: No/denies pain    Home Living Family/patient expects to be discharged to:: Private residence Living Arrangements: Spouse/significant other Available Help at Discharge: Family Type of Home: House Home Access: Stairs to enter Entrance Stairs-Rails: Can reach  both;Right;Left Entrance Stairs-Number of Steps: 3 Home Layout: One level Home Equipment: Walker - 2 wheels;Cane - single point;Crutches;Bedside commode;Shower seat      Prior Function Level of Independence: Independent;Independent with assistive device(s)         Comments: using RW for last 6 weeks     Hand Dominance   Dominant Hand: Right    Extremity/Trunk Assessment   Upper Extremity Assessment: Defer to OT evaluation;Overall [facility] for tasks assessed           Lower Extremity Assessment: Defer to PT evaluation;LLE deficits/detail   LLE Deficits / Details: ankle WFL, knee AAROM 5 to 35* flexion; knee extension and hip flexion grossly 3/5  Cervical / Trunk Assessment: Normal  Communication   Communication: No difficulties  Cognition Arousal/Alertness: Awake/alert Behavior During Therapy: WFL for tasks assessed/performed Overall Cognitive Status: Within Functional Limits for tasks assessed                      General Comments      Exercises Total Joint Exercises Ankle Circles/Pumps: AROM;Both;5 reps Quad Sets: 5 reps;AROM;Both Heel Slides: AROM;5 reps;Left      Assessment/Plan    PT Assessment Patient needs continued PT services  PT Diagnosis Difficulty walking   PT Problem List Decreased strength;Decreased range of motion;Decreased activity tolerance;Decreased mobility  PT Treatment Interventions DME instruction;Gait training;Functional mobility training;Stair training;Therapeutic activities;Therapeutic exercise;Patient/family education   PT Goals (Current goals  can be found in the Care Plan section) Acute Rehab PT Goals Patient Stated Goal: return to IND PT Goal Formulation: With patient Time For Goal Achievement: 01/19/16 Potential to Achieve Goals: Good    Frequency 7X/week   Barriers to discharge        Co-evaluation               End of Session Equipment Utilized During Treatment: Gait belt Activity Tolerance: Patient  tolerated treatment well;No increased pain Patient left: in chair;with call bell/phone within reach;with chair alarm set           Time: [id number] PT Time Calculation (min) (ACUTE ONLY): 28 min   Charges:   PT Evaluation $PT Eval Low Complexity: 1 Procedure PT Treatments $Gait Training: 8-22 mins   PT G Codes:        Davinder Haff 2016-01-21, 1:25 PM

## 2016-01-16 NOTE — Progress Notes (Signed)
Occupational Therapy Evaluation Patient Details Name: [name] MRN: [id number] DOB: [birth year] Today's Date: 01/16/2016    History of Present Illness s/p  L TKA reimplantation    Clinical Impression   All OT education completed and pt questions answered. No further OT needs. Will sign off.    Follow Up Recommendations  No OT follow up;Supervision - Intermittent    Equipment Recommendations  None recommended by OT    Recommendations for Other Services       Precautions / Restrictions Precautions Precautions: Fall Restrictions Weight Bearing Restrictions: No Other Position/Activity Restrictions: WBAT      Mobility Bed Mobility               General bed mobility comments: NT -- up in recliner  Transfers Overall transfer level: Needs assistance Equipment used: Rolling walker (2 wheeled) Transfers: Sit to/from Stand Sit to Stand: Supervision              Balance                                            ADL Overall ADL's : Needs assistance/impaired Eating/Feeding: Independent   Grooming: Wash/dry hands;Oral care;Supervision/safety;Standing   Upper Body Bathing: Set up;Sitting   Lower Body Bathing: Supervison/ safety;Sit to/from stand   Upper Body Dressing : Set up;Sitting   Lower Body Dressing: Minimal assistance;Sit to/from stand   Toilet Transfer: Supervision/safety;Ambulation;Comfort height toilet;Grab bars;RW   Toileting- Clothing Manipulation and Hygiene: Supervision/safety;Sit to/from stand       Functional mobility during ADLs: Supervision/safety;Rolling walker General ADL Comments: Patient familiar with OT techniques. Reviewed LB dressing techniques, practiced toileting. Patient states she knows how to do shower transfer and does not need to practice. No further OT needs.     Vision     Perception     Praxis      Pertinent Vitals/Pain Pain Assessment: No/denies pain     Hand Dominance Right    Extremity/Trunk Assessment Upper Extremity Assessment Upper Extremity Assessment: Overall WFL for tasks assessed   Lower Extremity Assessment Lower Extremity Assessment: Defer to PT evaluation   Cervical / Trunk Assessment Cervical / Trunk Assessment: Normal   Communication Communication Communication: No difficulties   Cognition Arousal/Alertness: Awake/alert Behavior During Therapy: WFL for tasks assessed/performed Overall Cognitive Status: Within Functional Limits for tasks assessed                     General Comments       Exercises       Shoulder Instructions      Home Living Family/patient expects to be discharged to:: Private residence Living Arrangements: Spouse/significant other Available Help at Discharge: Family Type of Home: House Home Access: Stairs to enter Technical brewer of Steps: 3 Entrance Stairs-Rails: Can reach both;Right;Left Home Layout: One level     Bathroom Shower/Tub: Occupational psychologist: Handicapped height Bathroom Accessibility: Yes How Accessible: Accessible via walker Home Equipment: [place] - 2 wheels;Cane - single point;Crutches;Bedside commode;Shower seat          Prior Functioning/Environment Level of Independence: Independent;Independent with assistive device(s)        Comments: using RW for last 6 weeks    OT Diagnosis: Acute pain   OT Problem List: Decreased strength;Decreased range of motion;Pain   OT Treatment/Interventions:      OT Goals(Current goals can be  found in the care plan section) Acute Rehab OT Goals OT Goal Formulation: All assessment and education complete, DC therapy  OT Frequency:     Barriers to D/C:            Co-evaluation              End of Session Equipment Utilized During Treatment: Rolling walker Nurse Communication: Mobility status  Activity Tolerance: Patient tolerated treatment well Patient left: in chair;with call bell/phone within reach;with  chair alarm set;with family/visitor present   Time: [id number] OT Time Calculation (min): 12 min Charges:  OT General Charges $OT Visit: 1 Procedure OT Evaluation $OT Eval Low Complexity: 1 Procedure G-Codes:    [name] A 09-Feb-2016, 12:46 PM

## 2016-01-16 NOTE — Care Management Note (Signed)
Case Management Note  Patient Details  Name: [name] MRN: [id number] Date of Birth: Jan 01, 1951  Subjective/Objective:                  RESECTION OF LEFT TOTAL KNEE, PLACEMENT OF ANTIBIOTIC SPACERS Action/Plan: Discharge planning Expected Discharge Date:                  Expected Discharge Plan:  [place]  In-House Referral:     Discharge planning Services  CM Consult  Post Acute Care Choice:  Home Health Choice offered to:  Patient  DME Arranged:  N/A DME Agency:  NA  HH Arranged:  PT Annex Agency:  [place]  Status of Service:  Completed, signed off  Medicare Important Message Given:    Date Medicare IM Given:    Medicare IM give by:    Date Additional Medicare IM Given:    Additional Medicare Important Message give by:     If discussed at [place] of Stay Meetings, dates discussed:    Additional Comments: CM met with pt in room to offer choice of home health agency.  Pt chooses [facility] to render HHPT. Referral called to [facility] rep, Santiago Glad with request for [place] for PT.  Pt has all DME at home from previous surgery.  No other CM needs were communicated.   Dellie Catholic, RN 01/16/2016, 11:14 AM

## 2016-01-16 NOTE — Op Note (Signed)
[name], [name]                 ACCOUNT NO.:  [id number]  MEDICAL RECORD NO.:  [id number]  LOCATION:  [id number]                         FACILITY:  [facility]  PHYSICIAN:  Pietro Cassis. Alvan Dame, M.D.  DATE OF BIRTH:  Sep 27, 1951  DATE OF PROCEDURE:  01/15/2016 DATE OF DISCHARGE:                              OPERATIVE REPORT   PREOPERATIVE DIAGNOSES:  Status post resection, left total knee arthroplasty, placement of antibiotic spacer.  POSTOPERATIVE DIAGNOSES:  Status post resection, left total knee arthroplasty, placement of antibiotic spacer.  PROCEDURE:  Reimplantation, left total knee arthroplasty.  COMPONENTS USED:  DePuy knee system with a size 2 posterior stabilized femur with a 4 mm distal medial augment, 5-degree bolt and a 2-degree +2 adapter with a 13 x 60 mm cemented stem.  The tibia size was a size 2 MBT revision tray with a 29 cemented sleeve.  A 12.5 mm posterior stabilized insert and a 38 patellar button.  SURGEON:  Pietro Cassis. Alvan Dame, M.D.  ASSISTANT:  Danae Orleans, PA-C.  Note that, Mr. [name] was present for the entirety of the case from preoperative positioning, perioperative management of the operative extremity, general facilitation of the case prior to wound closure.  ANESTHESIA:  General.  BLOOD LOSS:  Probably about 200 mL.  DRAINS:  None.  COMPLICATIONS:  None.  TOURNIQUET TIME:  57 minutes at 250 mmHg.  INDICATIONS FOR PROCEDURE:  [name] is a 65 year old female, who was kindly referred for evaluation and management of a failed left total knee.  At the time of her explant and plan for revision surgery, her bone quality was noted to be significantly affected by either osteolysis or concern for infection.  I elected to treat her as an infection at least with antibiotic spacer to let her bone to settle down to get a better idea of actual bone quality and stock prior to reimplantation given her age.  She had successfully undergone this and is at this  point ready to proceed with reimplantation.  She had no recent fevers, chills, or night sweats.  Preoperative lab work was trending normal and was normal.  Consent was obtained after discussing risks of infection, DVT, component failure, need for future revision surgery.  PROCEDURE IN DETAIL:  The patient was brought to the operative theater. Time-out was performed identifying the patient, planned procedure, and extremity.  Left lower extremity was then prepped and draped in sterile fashion to allow for the leg to be in the leg holder.  The leg was exsanguinated.  Tourniquet elevated to 250 mmHg.  Her old incision had been demarcated on the skin.  Soft tissue planes were created.  A median arthrotomy was made.  The soft tissue envelope was exposed.  Synovectomy was carried out medially and laterally.  I worked to provide adequate exposure of the joint with preservation of the extensor mechanism.  No quadriceps snip required.  Once the knee was exposed, I removed the tibia and the femoral cemented components without significant bone loss or other complications.  Once these were removed, I debrided all the fibrous tissue off the patella, distal femur, and proximal tibia.  I then opened up the distal femur and  the proximal tibia with the intramedullary rod and reamed up to a 15 mm reamer.  Once this was complete, I irrigated the canal using pulse lavage canal brush irrigator.  Attention was now first directed to the proximal tibia using extramedullary guide.  I resected minimal bone off the lateral proximal tibia with the defect noted medially from her preoperative state including large proximal medial metaphyseal bone loss from her osteolysis or other process.  This was not specifically addressed as no further bone removed off the medial side, instead I would utilize the cemented sleeve to provide the proximal metaphyseal support and the lateral buttress of the lateral one- half  to two-thirds of the tibial surface for axial loading support.  Once I made this cut, we confirmed it was perpendicular in the coronal plane and parallel to the tibial spine.  Once this was done, I sized the tibial surface to be best fit with a size 2 tibial tray.  The tray was pinned into position, drilled for an MBT revision tray, and then broached for a 29 cemented broach.  A trial tibial component was then placed with 29 cemented sleeve trial utilized due to the fact that we knew that this was perpendicular to set rotation of the femoral component.  A 13 mm intramedullary rod was then passed in the femur with a cylindrical aperture opening.  I then revisited the distal femoral cut realizing a 4 mm distal medial augment was then required for 5 degrees of valgus on this distal cut.  I then sized the femur and used a size 2 femur, it would fit best. Anterior, posterior, chamfer cuts were all revisited followed by the box cut.  Based on the fact that we are going to use the distal augment, I did elect to use a cemented sleeve, I did use a 60 mm stem with no femoral sleeve.  Trial reduction now carried out with 2 femur, +2 bolt, 5-degree adapter, and a 13 x 60 mm stem.  At this point, with the 12.5 insert, I found the knee came to full extension and knee ligaments were stable from extension to flexion.  At this point, we revisited the patella, precut measurement was still in the teens.  I removed minimal amount of bone and then drilled holes for 38 patellar button.  At this point, all trial components were removed.  We irrigated the knee with normal saline solution, pulse lavage.  Final components were opened on the back field.  Final components were configured on the back table under direct supervision.  Once the components were configured, cement was mixed on the back table.  Once cement was ready and cement restrictor placed in the distal femur and proximal tibia, the final  components were cemented into place and brought to extension with a 12.5 insert until the cement had cured. Once the cement had fully cured, excessive cement was removed throughout the knee.  We selected a 12.5 posterior stabilized insert and I opened it.  The knee was then placed on the tibial tray, the knee was reduced. The knee was re-irrigated with normal saline solution.  We reapproximated the extensor mechanism using a combination of #1 Vicryl and 0 V-Loc suture.  The remainder of the wound was closed with 2-0 Vicryl and running 3-0 Monocryl.  The knee was cleaned, dried, and dressed sterilely using surgical glue and Aquacel dressing.  The patient was then brought to the recovery room in stable condition tolerating the procedure well.  Findings  were reviewed with her husband.     Pietro Cassis Alvan Dame, M.D.     MDO/MEDQ  D:  01/15/2016  T:  01/16/2016  Job:  [id number]

## 2016-01-16 NOTE — Progress Notes (Signed)
Patient ID: [name], female   DOB: Dec 07, 1950, 65 y.o.   MRN: [id number] Subjective: 1 Day Post-Op Procedure(s) (LRB): REIMPLANTATION OF LEFT TOTAL KNEE (Left)    Patient reports pain as mild.  Relatively comfortable sitting up in bed sipping coffee. No events  Objective:   VITALS:   Filed Vitals:   01/16/16 0607 01/16/16 1016  BP: 110/52 96/49  Pulse: 74 81  Temp: 98.3 F (36.8 C) 98.6 F (37 C)  Resp: 16 16    Neurovascular intact Incision: dressing C/D/I  LABS  Recent Labs  01/16/16 0403  HGB 7.4*  HCT 22.2*  WBC 9.8  PLT 205     Recent Labs  01/16/16 0403  NA 140  K 4.3  BUN 18  CREATININE 0.80  GLUCOSE 142*    No results for input(s): LABPT, INR in the last 72 hours.   Assessment/Plan: 1 Day Post-Op Procedure(s) (LRB): REIMPLANTATION OF LEFT TOTAL KNEE (Left)   Advance diet Up with therapy Plan for discharge tomorrow   Will most likely treat acute blood loss anemia with 2 units of PRBCs to be given tonight to prevent probable discharge to home tomorrow Repeat CBC in am

## 2016-01-16 NOTE — Progress Notes (Signed)
Physical Therapy Treatment Patient Details Name: [name] MRN: [id number] DOB: 21-Sep-1951 Today's Date: 01/16/2016    History of Present Illness s/p  L TKA reimplantation     PT Comments    POD # 1 pm session Assisted to bathroom then amb in hallway.  Assisted back to bed and performed some TKR TE's.    Follow Up Recommendations  Home health PT     Equipment Recommendations  None recommended by PT    Recommendations for Other Services       Precautions / Restrictions Precautions Precautions: Fall Restrictions Weight Bearing Restrictions: No Other Position/Activity Restrictions: WBAT    Mobility  Bed Mobility Overal bed mobility: Needs Assistance Bed Mobility: Sit to Supine     Supine to sit: Min guard     General bed mobility comments: incr time, light assist with LLE and then pt self assists LLE with RLE  Transfers Overall transfer level: Needs assistance Equipment used: Rolling walker (2 wheeled) Transfers: Sit to/from Stand Sit to Stand: Supervision;Min guard         General transfer comment: for safety  Ambulation/Gait Ambulation/Gait assistance: Supervision;Min guard Ambulation Distance (Feet): 115 Feet Assistive device: Rolling walker (2 wheeled) Gait Pattern/deviations: Step-to pattern;Antalgic     General Gait Details: cues for sequence, step length.  decreased distance this afternoon due to fatigue   Stairs            Wheelchair Mobility    Modified Rankin (Stroke Patients Only)       Balance                                    Cognition Arousal/Alertness: Awake/alert Behavior During Therapy: WFL for tasks assessed/performed Overall Cognitive Status: Within Functional Limits for tasks assessed                      Exercises Total Joint Exercises Ankle Circles/Pumps: AROM;Both;5 reps Quad Sets: 5 reps;AROM;Both Heel Slides: AROM;5 reps;Left    General Comments        Pertinent Vitals/Pain  Pain Assessment: No/denies pain    Home Living Family/patient expects to be discharged to:: Private residence Living Arrangements: Spouse/significant other Available Help at Discharge: Family Type of Home: House Home Access: Stairs to enter Entrance Stairs-Rails: Can reach both;Right;Left Home Layout: One level Home Equipment: Environmental consultant - 2 wheels;Cane - single point;Crutches;Bedside commode;Shower seat      Prior Function Level of Independence: Independent;Independent with assistive device(s)      Comments: using RW for last 6 weeks   PT Goals (current goals can now be found in the care plan section) Acute Rehab PT Goals Patient Stated Goal: return to IND PT Goal Formulation: With patient Time For Goal Achievement: 01/19/16 Potential to Achieve Goals: Good    Frequency  7X/week    PT Plan      Co-evaluation             End of Session Equipment Utilized During Treatment: Gait belt Activity Tolerance: Patient tolerated treatment well;No increased pain Patient left: in chair;with call bell/phone within reach;with chair alarm set     Time: 1435-1500 PT Time Calculation (min) (ACUTE ONLY): 25 min  Charges:  $Gait Training: 8-22 mins $Therapeutic Activity: 8-22 mins                    G Codes:      Rica Koyanagi  PTA [facility]  Acute  Rehab Pager      [phone]

## 2016-01-17 LAB — BASIC METABOLIC PANEL
ANION GAP: 7 (ref 5–15)
BUN: 19 mg/dL (ref 6–20)
CHLORIDE: 106 mmol/L (ref 101–111)
CO2: 27 mmol/L (ref 22–32)
Calcium: 8.8 mg/dL — ABNORMAL LOW (ref 8.9–10.3)
Creatinine, Ser: 0.67 mg/dL (ref 0.44–1.00)
GFR calc Af Amer: >60 mL/min (ref >60)
Glucose, Bld: 144 mg/dL — ABNORMAL HIGH (ref 65–99)
POTASSIUM: 4.2 mmol/L (ref 3.5–5.1)
SODIUM: 140 mmol/L (ref 135–145)

## 2016-01-17 LAB — CBC
HCT: 26.9 % — ABNORMAL LOW (ref 36.0–46.0)
HEMOGLOBIN: 9.1 g/dL — AB (ref 12.0–15.0)
MCH: 29.3 pg (ref 26.0–34.0)
MCHC: 33.8 g/dL (ref 30.0–36.0)
MCV: 86.5 fL (ref 78.0–100.0)
PLATELETS: 173 10*3/uL (ref 150–400)
RBC: 3.11 MIL/uL — AB (ref 3.87–5.11)
RDW: 13.7 % (ref 11.5–15.5)
WBC: 9.4 10*3/uL (ref 4.0–10.5)

## 2016-01-17 MED ORDER — OXYCODONE HCL 5 MG PO TABS: 5.0000 mg | ORAL_TABLET | ORAL | Status: DC | PRN

## 2016-01-17 MED ORDER — CELECOXIB 200 MG PO CAPS: 200.0000 mg | ORAL_CAPSULE | Freq: Two times a day (BID) | ORAL | Status: AC

## 2016-01-17 MED ORDER — DOCUSATE SODIUM 100 MG PO CAPS: 100.0000 mg | ORAL_CAPSULE | Freq: Two times a day (BID) | ORAL | Status: DC

## 2016-01-17 MED ORDER — POLYETHYLENE GLYCOL 3350 17 G PO PACK: 17.0000 g | PACK | Freq: Two times a day (BID) | ORAL | Status: DC

## 2016-01-17 MED ORDER — FERROUS SULFATE 325 (65 FE) MG PO TABS: 325.0000 mg | ORAL_TABLET | Freq: Three times a day (TID) | ORAL | Status: DC

## 2016-01-17 MED ORDER — ASPIRIN 325 MG PO TBEC: 325.0000 mg | DELAYED_RELEASE_TABLET | Freq: Two times a day (BID) | ORAL | Status: AC

## 2016-01-17 MED ORDER — METHOCARBAMOL 500 MG PO TABS: 500.0000 mg | ORAL_TABLET | Freq: Four times a day (QID) | ORAL | Status: DC | PRN

## 2016-01-17 NOTE — Discharge Instructions (Signed)

## 2016-01-17 NOTE — Progress Notes (Signed)
     Subjective: 2 Days Post-Op Procedure(s) (LRB): REIMPLANTATION OF LEFT TOTAL KNEE (Left)   Patient reports pain as mild, pain controlled better today.  Feels better after receiving 2 units of blood yesterday.  Ready to be discharged home.  Objective:   VITALS:   Filed Vitals:   01/17/16 0300 01/17/16 0604  BP: 120/58 124/61  Pulse: 71 73  Temp: 97.7 F (36.5 C) 98 F (36.7 C)  Resp: 16 15    Dorsiflexion/Plantar flexion intact Incision: dressing C/D/I No cellulitis present Compartment soft  LABS  Recent Labs  01/16/16 0403 01/17/16 0348  HGB 7.4* 9.1*  HCT 22.2* 26.9*  WBC 9.8 9.4  PLT 205 173     Recent Labs  01/16/16 0403 01/17/16 0348  NA 140 140  K 4.3 4.2  BUN 18 19  CREATININE 0.80 0.67  GLUCOSE 142* 144*     Assessment/Plan: 2 Days Post-Op Procedure(s) (LRB): REIMPLANTATION OF LEFT TOTAL KNEE (Left) Up with therapy Discharge home with home health  Follow up in 2 weeks at [facility]. Follow up with OLIN,Jazzalyn Loewenstein D in 2 weeks.  Contact information:  [facility] [address] [place] [id number]     Overweight (BMI 25-29.9) Estimated body mass index is 27.72 kg/(m^2) as calculated from the following:   Height as of this encounter: 5' 3.5" (1.613 m).   Weight as of this encounter: 72.122 kg (159 lb). Patient also counseled that weight may inhibit the healing process Patient counseled that losing weight will help with future health issues       [place] Pugh. Rut Betterton   PAC  01/17/2016, 9:30 AM

## 2016-01-17 NOTE — Progress Notes (Signed)
Physical Therapy Treatment Patient Details Name: [name] MRN: [id number] DOB: 28-Mar-1951 Today's Date: 01/17/2016    History of Present Illness s/p  L TKA reimplantation     PT Comments    POD # 2 Pt feeling much better after blood transfusion.  Assisted OOB to amb a greater distance.  Practiced stairs and performed TE's following HEP handout.   Follow Up Recommendations  Home health PT     Equipment Recommendations  None recommended by PT    Recommendations for Other Services       Precautions / Restrictions Precautions Precautions: Fall Restrictions Weight Bearing Restrictions: No Other Position/Activity Restrictions: WBAT    Mobility  Bed Mobility               General bed mobility comments: Pt OOB in recliner  Transfers Overall transfer level: Needs assistance Equipment used: Rolling walker (2 wheeled) Transfers: Sit to/from Stand Sit to Stand: Supervision         General transfer comment: for safety  Ambulation/Gait Ambulation/Gait assistance: Supervision Ambulation Distance (Feet): 140 Feet Assistive device: Rolling walker (2 wheeled) Gait Pattern/deviations: Step-to pattern;Step-through pattern Gait velocity: decreased   General Gait Details: cues for sequence, step length.   Tolerated increased distance.   Stairs Stairs: Yes Stairs assistance: Min guard   Number of Stairs: 2 General stair comments: 25% VC's on safety and proper sequencing  Wheelchair Mobility    Modified Rankin (Stroke Patients Only)       Balance                                    Cognition Arousal/Alertness: Awake/alert Behavior During Therapy: WFL for tasks assessed/performed Overall Cognitive Status: Within Functional Limits for tasks assessed                      Exercises   Total Knee Replacement TE's 10 reps B LE ankle pumps 10 reps towel squeezes 10 reps knee presses 10 reps heel slides  10 reps SAQ's 10 reps  SLR's 10 reps ABD Followed by [facility]    General Comments        Pertinent Vitals/Pain Pain Assessment: 0-10 Pain Score: 5  Pain Location: L knee Pain Descriptors / Indicators: Burning;Sore Pain Intervention(s): Monitored during session;Premedicated before session;Repositioned;Ice applied    Home Living                      Prior Function            PT Goals (current goals can now be found in the care plan section) Progress towards PT goals: Progressing toward goals    Frequency  7X/week    PT Plan      Co-evaluation             End of Session Equipment Utilized During Treatment: Gait belt Activity Tolerance: Patient tolerated treatment well;No increased pain Patient left: in chair;with call bell/phone within reach;with chair alarm set     Time: 0955-1020 PT Time Calculation (min) (ACUTE ONLY): 25 min  Charges:  $Gait Training: 8-22 mins $Therapeutic Exercise: 8-22 mins                    G Codes:      Rica Koyanagi  PTA [facility]  Acute  Rehab Pager      [phone]

## 2016-01-18 DIAGNOSIS — M5136 Other intervertebral disc degeneration, lumbar region: Secondary | ICD-10-CM | POA: Diagnosis not present

## 2016-01-18 DIAGNOSIS — F419 Anxiety disorder, unspecified: Secondary | ICD-10-CM | POA: Diagnosis not present

## 2016-01-18 DIAGNOSIS — Z96652 Presence of left artificial knee joint: Secondary | ICD-10-CM | POA: Diagnosis not present

## 2016-01-18 DIAGNOSIS — Z7982 Long term (current) use of aspirin: Secondary | ICD-10-CM | POA: Diagnosis not present

## 2016-01-18 DIAGNOSIS — T8454XD Infection and inflammatory reaction due to internal left knee prosthesis, subsequent encounter: Secondary | ICD-10-CM | POA: Diagnosis not present

## 2016-01-18 DIAGNOSIS — G4733 Obstructive sleep apnea (adult) (pediatric): Secondary | ICD-10-CM | POA: Diagnosis not present

## 2016-01-18 DIAGNOSIS — D303 Benign neoplasm of bladder: Secondary | ICD-10-CM | POA: Diagnosis not present

## 2016-01-18 DIAGNOSIS — R3915 Urgency of urination: Secondary | ICD-10-CM | POA: Diagnosis not present

## 2016-01-18 DIAGNOSIS — I10 Essential (primary) hypertension: Secondary | ICD-10-CM | POA: Diagnosis not present

## 2016-01-18 DIAGNOSIS — M1990 Unspecified osteoarthritis, unspecified site: Secondary | ICD-10-CM | POA: Diagnosis not present

## 2016-01-18 LAB — TYPE AND SCREEN
ABO/RH(D): A POS
ANTIBODY SCREEN: NEGATIVE
UNIT DIVISION: 0
Unit division: 0

## 2016-01-22 DIAGNOSIS — T8454XD Infection and inflammatory reaction due to internal left knee prosthesis, subsequent encounter: Secondary | ICD-10-CM | POA: Diagnosis not present

## 2016-01-22 DIAGNOSIS — F419 Anxiety disorder, unspecified: Secondary | ICD-10-CM | POA: Diagnosis not present

## 2016-01-22 DIAGNOSIS — G4733 Obstructive sleep apnea (adult) (pediatric): Secondary | ICD-10-CM | POA: Diagnosis not present

## 2016-01-22 DIAGNOSIS — M1990 Unspecified osteoarthritis, unspecified site: Secondary | ICD-10-CM | POA: Diagnosis not present

## 2016-01-22 DIAGNOSIS — M5136 Other intervertebral disc degeneration, lumbar region: Secondary | ICD-10-CM | POA: Diagnosis not present

## 2016-01-22 DIAGNOSIS — D303 Benign neoplasm of bladder: Secondary | ICD-10-CM | POA: Diagnosis not present

## 2016-01-22 DIAGNOSIS — R3915 Urgency of urination: Secondary | ICD-10-CM | POA: Diagnosis not present

## 2016-01-22 DIAGNOSIS — Z7982 Long term (current) use of aspirin: Secondary | ICD-10-CM | POA: Diagnosis not present

## 2016-01-22 DIAGNOSIS — Z96652 Presence of left artificial knee joint: Secondary | ICD-10-CM | POA: Diagnosis not present

## 2016-01-22 DIAGNOSIS — I10 Essential (primary) hypertension: Secondary | ICD-10-CM | POA: Diagnosis not present

## 2016-01-22 NOTE — Discharge Summary (Signed)
Physician Discharge Summary  Patient ID: [name] MRN: [id number] DOB/AGE: July 10, 1951 65 y.o.  Admit date: 01/15/2016 Discharge date: 01/17/2016   Procedures:  Procedure(s) (LRB): REIMPLANTATION OF LEFT TOTAL KNEE (Left)  Attending Physician:  Dr. Paralee Cancel   Admission Diagnoses:   S/P failed left total knee arthroplasty, questionable septic versus aseptic failure, and placement of antibiotic spacer  Discharge Diagnoses:  Principal Problem:   S/P revision left TKA Active Problems:   Overweight (BMI 25.0-29.9)  Past Medical History  Diagnosis Date  . Hypertension   . Normal cardiac stress test 08-10-2004  . Bladder tumor     benign  . Blood transfusion   . Urgency of urination   . Arthritis KNEES AND HANDS  . DDD (degenerative disc disease), lumbar   . DDD (degenerative disc disease)   . Anxiety   . OSA on CPAP     uses cpap, pt does not know settings    HPI:    Pt is a 65 y.o. female is S/P resection left total knee and placement of antibiotic spacer on 11/27/2015. She has been treated with IV antibiotics and been followed by Dr. Megan Salon. She had a reaction with Vancomycin, but continued her cephalosporin. She continues to do well and there is no sign of infection. To date not growth with her cultures. Discussed options with Dr. Alvan Dame and she is schedule for a reimplantation of the left TKA. Risks, benefits and expectations were discussed with the patient. Risks including but not limited to the risk of anesthesia, blood clots, nerve damage, blood vessel damage, failure of the prosthesis, infection and up to and including death. Patient understand the risks, benefits and expectations and wishes to proceed with surgery.   PCP: Horatio Pel, MD   Discharged Condition: good  Hospital Course:  Patient underwent the above stated procedure on 01/15/2016. Patient tolerated the procedure well and brought to the recovery room in good condition and subsequently  to the floor.  POD #1 BP: 96/49 ; Pulse: 81 ; Temp: 98.6 F (37 C) ; Resp: 16 Patient reports pain as mild. Relatively comfortable sitting up in bed sipping coffee. No events. Neurovascular intact and incision: dressing C/D/I  LABS  Basename    HGB     7.4  HCT     22.2   POD #2  BP: 124/61 ; Pulse: 73 ; Temp: 98 F (36.7 C) ; Resp: 15 Patient reports pain as mild, pain controlled better today. Feels better after receiving 2 units of blood yesterday. Ready to be discharged home. Dorsiflexion/plantar flexion intact, incision: dressing C/D/I, no cellulitis present and compartment soft.   LABS  Basename    HGB     9.1  HCT     26.9    Discharge Exam: General appearance: alert, cooperative and no distress Extremities: Homans sign is negative, no sign of DVT, no edema, redness or tenderness in the calves or thighs and no ulcers, gangrene or trophic changes  Disposition: Home with follow up in 2 weeks   Follow-up Information    Follow up with Mauri Pole, MD. Schedule an appointment as soon as possible for a visit in 2 weeks.   Specialty:  Orthopedic Surgery   Contact information:   [address] [place] [address] [phone]       Follow up with [facility].   Why:  Tharon Aquas has been requested for your physical therapist   Contact information:   [address]  [place] [address] [phone]       Discharge Instructions    Call MD / Call 911    Complete by:  As directed   If you experience chest pain or shortness of breath, CALL 911 and be transported to the hospital emergency room.  If you develope a fever above 101 F, pus (white drainage) or increased drainage or redness at the wound, or calf pain, call your surgeon's office.     Change dressing    Complete by:  As directed   Maintain surgical dressing until follow up in the clinic. If the edges start to pull up, may reinforce with tape. If the dressing is no  longer working, may remove and cover with gauze and tape, but must keep the area dry and clean.  Call with any questions or concerns.     Constipation Prevention    Complete by:  As directed   Drink plenty of fluids.  Prune juice may be helpful.  You may use a stool softener, such as Colace (over the counter) 100 mg twice a day.  Use MiraLax (over the counter) for constipation as needed.     Diet - low sodium heart healthy    Complete by:  As directed      Discharge instructions    Complete by:  As directed   Maintain surgical dressing until follow up in the clinic. If the edges start to pull up, may reinforce with tape. If the dressing is no longer working, may remove and cover with gauze and tape, but must keep the area dry and clean.  Follow up in 2 weeks at [facility]. Call with any questions or concerns.     Increase activity slowly as tolerated    Complete by:  As directed   Weight bearing as tolerated with assist device (walker, cane, etc) as directed, use it as long as suggested by your surgeon or therapist, typically at least 4-6 weeks.     TED hose    Complete by:  As directed   Use stockings (TED hose) for 2 weeks on both leg(s).  You may remove them at night for sleeping.             Medication List    STOP taking these medications        acetaminophen 325 MG tablet  Commonly known as:  TYLENOL     ondansetron 4 MG tablet  Commonly known as:  ZOFRAN      TAKE these medications        aspirin 325 MG EC tablet  Take 1 tablet (325 mg total) by mouth 2 (two) times daily.     celecoxib 200 MG capsule  Commonly known as:  CELEBREX  Take 1 capsule (200 mg total) by mouth every 12 (twelve) hours.     cholecalciferol 1000 units tablet  Commonly known as:  VITAMIN D  Take 1,000 Units by mouth every morning.     docusate sodium 100 MG capsule  Commonly known as:  COLACE  Take 1 capsule (100 mg total) by mouth 2 (two) times daily.     estrogens  (conjugated) 0.45 MG tablet  Commonly known as:  PREMARIN  Take 0.45 mg by mouth every morning. Reported on 01/01/2016     EXFORGE HCT 10-320-25 MG Tabs  Generic drug:  Amlodipine-Valsartan-HCTZ  Take 1 tablet by mouth every morning.     ferrous sulfate 325 (65 FE) MG tablet  Take 1 tablet (  325 mg total) by mouth 3 (three) times daily after meals.     methocarbamol 500 MG tablet  Commonly known as:  ROBAXIN  Take 1 tablet (500 mg total) by mouth every 6 (six) hours as needed for muscle spasms.     multivitamin with minerals Tabs tablet  Take 1 tablet by mouth every morning.     oxyCODONE 5 MG immediate release tablet  Commonly known as:  Oxy IR/ROXICODONE  Take 1-3 tablets (5-15 mg total) by mouth every 4 (four) hours as needed for severe pain.     polyethylene glycol packet  Commonly known as:  MIRALAX / GLYCOLAX  Take 17 g by mouth 2 (two) times daily.     sertraline 50 MG tablet  Commonly known as:  ZOLOFT  Take 50 mg by mouth every morning.     zolpidem 12.5 MG CR tablet  Commonly known as:  AMBIEN CR  Take 12.5 mg by mouth at bedtime as needed for sleep.         Signed: [place] Pugh. Alyssabeth Bruster   PA-C  01/22/2016, 3:10 PM

## 2016-01-24 DIAGNOSIS — Z96652 Presence of left artificial knee joint: Secondary | ICD-10-CM | POA: Diagnosis not present

## 2016-01-24 DIAGNOSIS — D303 Benign neoplasm of bladder: Secondary | ICD-10-CM | POA: Diagnosis not present

## 2016-01-24 DIAGNOSIS — Z7982 Long term (current) use of aspirin: Secondary | ICD-10-CM | POA: Diagnosis not present

## 2016-01-24 DIAGNOSIS — M5136 Other intervertebral disc degeneration, lumbar region: Secondary | ICD-10-CM | POA: Diagnosis not present

## 2016-01-24 DIAGNOSIS — R3915 Urgency of urination: Secondary | ICD-10-CM | POA: Diagnosis not present

## 2016-01-24 DIAGNOSIS — F419 Anxiety disorder, unspecified: Secondary | ICD-10-CM | POA: Diagnosis not present

## 2016-01-24 DIAGNOSIS — I10 Essential (primary) hypertension: Secondary | ICD-10-CM | POA: Diagnosis not present

## 2016-01-24 DIAGNOSIS — T8454XD Infection and inflammatory reaction due to internal left knee prosthesis, subsequent encounter: Secondary | ICD-10-CM | POA: Diagnosis not present

## 2016-01-24 DIAGNOSIS — M1990 Unspecified osteoarthritis, unspecified site: Secondary | ICD-10-CM | POA: Diagnosis not present

## 2016-01-24 DIAGNOSIS — G4733 Obstructive sleep apnea (adult) (pediatric): Secondary | ICD-10-CM | POA: Diagnosis not present

## 2016-01-25 DIAGNOSIS — M5136 Other intervertebral disc degeneration, lumbar region: Secondary | ICD-10-CM | POA: Diagnosis not present

## 2016-01-25 DIAGNOSIS — F419 Anxiety disorder, unspecified: Secondary | ICD-10-CM | POA: Diagnosis not present

## 2016-01-25 DIAGNOSIS — D303 Benign neoplasm of bladder: Secondary | ICD-10-CM | POA: Diagnosis not present

## 2016-01-25 DIAGNOSIS — T8454XD Infection and inflammatory reaction due to internal left knee prosthesis, subsequent encounter: Secondary | ICD-10-CM | POA: Diagnosis not present

## 2016-01-25 DIAGNOSIS — R3915 Urgency of urination: Secondary | ICD-10-CM | POA: Diagnosis not present

## 2016-01-25 DIAGNOSIS — G4733 Obstructive sleep apnea (adult) (pediatric): Secondary | ICD-10-CM | POA: Diagnosis not present

## 2016-01-25 DIAGNOSIS — Z96652 Presence of left artificial knee joint: Secondary | ICD-10-CM | POA: Diagnosis not present

## 2016-01-25 DIAGNOSIS — I10 Essential (primary) hypertension: Secondary | ICD-10-CM | POA: Diagnosis not present

## 2016-01-25 DIAGNOSIS — Z7982 Long term (current) use of aspirin: Secondary | ICD-10-CM | POA: Diagnosis not present

## 2016-01-25 DIAGNOSIS — M1990 Unspecified osteoarthritis, unspecified site: Secondary | ICD-10-CM | POA: Diagnosis not present

## 2016-01-29 DIAGNOSIS — I10 Essential (primary) hypertension: Secondary | ICD-10-CM | POA: Diagnosis not present

## 2016-01-29 DIAGNOSIS — F419 Anxiety disorder, unspecified: Secondary | ICD-10-CM | POA: Diagnosis not present

## 2016-01-29 DIAGNOSIS — T8454XD Infection and inflammatory reaction due to internal left knee prosthesis, subsequent encounter: Secondary | ICD-10-CM | POA: Diagnosis not present

## 2016-01-29 DIAGNOSIS — Z96652 Presence of left artificial knee joint: Secondary | ICD-10-CM | POA: Diagnosis not present

## 2016-01-29 DIAGNOSIS — M1990 Unspecified osteoarthritis, unspecified site: Secondary | ICD-10-CM | POA: Diagnosis not present

## 2016-01-29 DIAGNOSIS — Z7982 Long term (current) use of aspirin: Secondary | ICD-10-CM | POA: Diagnosis not present

## 2016-01-29 DIAGNOSIS — R3915 Urgency of urination: Secondary | ICD-10-CM | POA: Diagnosis not present

## 2016-01-29 DIAGNOSIS — G4733 Obstructive sleep apnea (adult) (pediatric): Secondary | ICD-10-CM | POA: Diagnosis not present

## 2016-01-29 DIAGNOSIS — D303 Benign neoplasm of bladder: Secondary | ICD-10-CM | POA: Diagnosis not present

## 2016-01-29 DIAGNOSIS — M5136 Other intervertebral disc degeneration, lumbar region: Secondary | ICD-10-CM | POA: Diagnosis not present

## 2016-01-31 DIAGNOSIS — F419 Anxiety disorder, unspecified: Secondary | ICD-10-CM | POA: Diagnosis not present

## 2016-01-31 DIAGNOSIS — I10 Essential (primary) hypertension: Secondary | ICD-10-CM | POA: Diagnosis not present

## 2016-01-31 DIAGNOSIS — D649 Anemia, unspecified: Secondary | ICD-10-CM | POA: Diagnosis not present

## 2016-01-31 DIAGNOSIS — R748 Abnormal levels of other serum enzymes: Secondary | ICD-10-CM | POA: Diagnosis not present

## 2016-01-31 DIAGNOSIS — N951 Menopausal and female climacteric states: Secondary | ICD-10-CM | POA: Diagnosis not present

## 2016-01-31 DIAGNOSIS — Z23 Encounter for immunization: Secondary | ICD-10-CM | POA: Diagnosis not present

## 2016-01-31 DIAGNOSIS — M25562 Pain in left knee: Secondary | ICD-10-CM | POA: Diagnosis not present

## 2016-01-31 DIAGNOSIS — Z79890 Hormone replacement therapy (postmenopausal): Secondary | ICD-10-CM | POA: Diagnosis not present

## 2016-02-02 DIAGNOSIS — M25562 Pain in left knee: Secondary | ICD-10-CM | POA: Diagnosis not present

## 2016-02-07 DIAGNOSIS — M25562 Pain in left knee: Secondary | ICD-10-CM | POA: Diagnosis not present

## 2016-02-09 DIAGNOSIS — M25562 Pain in left knee: Secondary | ICD-10-CM | POA: Diagnosis not present

## 2016-02-12 DIAGNOSIS — M25562 Pain in left knee: Secondary | ICD-10-CM | POA: Diagnosis not present

## 2016-02-21 DIAGNOSIS — M25562 Pain in left knee: Secondary | ICD-10-CM | POA: Diagnosis not present

## 2016-02-23 DIAGNOSIS — M25562 Pain in left knee: Secondary | ICD-10-CM | POA: Diagnosis not present

## 2016-02-26 DIAGNOSIS — M25562 Pain in left knee: Secondary | ICD-10-CM | POA: Diagnosis not present

## 2016-02-26 DIAGNOSIS — M150 Primary generalized (osteo)arthritis: Secondary | ICD-10-CM | POA: Diagnosis not present

## 2016-02-26 DIAGNOSIS — M5136 Other intervertebral disc degeneration, lumbar region: Secondary | ICD-10-CM | POA: Diagnosis not present

## 2016-03-01 DIAGNOSIS — M25562 Pain in left knee: Secondary | ICD-10-CM | POA: Diagnosis not present

## 2016-03-04 DIAGNOSIS — M25562 Pain in left knee: Secondary | ICD-10-CM | POA: Diagnosis not present

## 2016-03-05 DIAGNOSIS — Z471 Aftercare following joint replacement surgery: Secondary | ICD-10-CM | POA: Diagnosis not present

## 2016-03-05 DIAGNOSIS — T84093D Other mechanical complication of internal left knee prosthesis, subsequent encounter: Secondary | ICD-10-CM | POA: Diagnosis not present

## 2016-03-06 DIAGNOSIS — M25562 Pain in left knee: Secondary | ICD-10-CM | POA: Diagnosis not present

## 2016-03-12 DIAGNOSIS — M25562 Pain in left knee: Secondary | ICD-10-CM | POA: Diagnosis not present

## 2016-03-15 DIAGNOSIS — M25562 Pain in left knee: Secondary | ICD-10-CM | POA: Diagnosis not present

## 2016-04-17 DIAGNOSIS — Z471 Aftercare following joint replacement surgery: Secondary | ICD-10-CM | POA: Diagnosis not present

## 2016-04-17 DIAGNOSIS — T84093D Other mechanical complication of internal left knee prosthesis, subsequent encounter: Secondary | ICD-10-CM | POA: Diagnosis not present

## 2016-05-29 DIAGNOSIS — N390 Urinary tract infection, site not specified: Secondary | ICD-10-CM | POA: Diagnosis not present

## 2016-05-29 DIAGNOSIS — Z0000 Encounter for general adult medical examination without abnormal findings: Secondary | ICD-10-CM | POA: Diagnosis not present

## 2016-05-29 DIAGNOSIS — I10 Essential (primary) hypertension: Secondary | ICD-10-CM | POA: Diagnosis not present

## 2016-05-29 DIAGNOSIS — E559 Vitamin D deficiency, unspecified: Secondary | ICD-10-CM | POA: Diagnosis not present

## 2016-05-29 DIAGNOSIS — Z79890 Hormone replacement therapy (postmenopausal): Secondary | ICD-10-CM | POA: Diagnosis not present

## 2016-05-29 DIAGNOSIS — R748 Abnormal levels of other serum enzymes: Secondary | ICD-10-CM | POA: Diagnosis not present

## 2016-06-04 DIAGNOSIS — I10 Essential (primary) hypertension: Secondary | ICD-10-CM | POA: Diagnosis not present

## 2016-06-04 DIAGNOSIS — M159 Polyosteoarthritis, unspecified: Secondary | ICD-10-CM | POA: Diagnosis not present

## 2016-06-04 DIAGNOSIS — Z0000 Encounter for general adult medical examination without abnormal findings: Secondary | ICD-10-CM | POA: Diagnosis not present

## 2016-06-04 DIAGNOSIS — M79671 Pain in right foot: Secondary | ICD-10-CM | POA: Diagnosis not present

## 2016-06-07 DIAGNOSIS — Z800 Family history of malignant neoplasm of digestive organs: Secondary | ICD-10-CM | POA: Diagnosis not present

## 2016-06-07 DIAGNOSIS — R1010 Upper abdominal pain, unspecified: Secondary | ICD-10-CM | POA: Diagnosis not present

## 2016-06-07 DIAGNOSIS — R748 Abnormal levels of other serum enzymes: Secondary | ICD-10-CM | POA: Diagnosis not present

## 2016-06-24 DIAGNOSIS — G4733 Obstructive sleep apnea (adult) (pediatric): Secondary | ICD-10-CM | POA: Diagnosis not present

## 2016-07-10 DIAGNOSIS — M7731 Calcaneal spur, right foot: Secondary | ICD-10-CM | POA: Diagnosis not present

## 2016-07-10 DIAGNOSIS — M722 Plantar fascial fibromatosis: Secondary | ICD-10-CM | POA: Diagnosis not present

## 2016-07-16 ENCOUNTER — Encounter: Payer: Self-pay | Admitting: Diagnostic Neuroimaging

## 2016-07-16 ENCOUNTER — Ambulatory Visit (INDEPENDENT_AMBULATORY_CARE_PROVIDER_SITE_OTHER): Payer: PPO | Admitting: Diagnostic Neuroimaging

## 2016-07-16 VITALS — BP 124/70 | HR 81 | Ht 63.5 in | Wt 165.0 lb

## 2016-07-16 DIAGNOSIS — R51 Headache: Secondary | ICD-10-CM | POA: Diagnosis not present

## 2016-07-16 DIAGNOSIS — R519 Headache, unspecified: Secondary | ICD-10-CM

## 2016-07-16 NOTE — Progress Notes (Signed)
[facility]  PATIENT: [name] DOB: 02/22/1951  REFERRING CLINICIAN: Audie Pinto HISTORY FROM: patient  REASON FOR VISIT: new consult    HISTORICAL  CHIEF COMPLAINT:  Chief Complaint  Patient presents with  . Trigeminal Neuralgia    rm 7, New Pt, "headaches on right side of head x 6 months, some relief with pressing on sides of head"    HISTORY OF PRESENT ILLNESS:   65 year old right-handed female here for evaluation of intermittent headaches. For past 6 was patient has felt intermittent stabbing sensation on the right side of her head, just above her right ear. Sometimes this radiates into the back of her head and neck. Attacks are described as stabbing sensation, lasting 1-2 minutes at a time, 4-5 attacks per hour, lasting over one hour time frame. These may occur 2-3 times per week. Symptoms are worse in the evening. No specific triggering factors. Sometimes she has right facial twitching. No history of head trauma, dental procedures, infections involving the right side of her head. Symptoms are gradually improving spontaneously. No nausea or vomiting. No photophobia or phonophobia. No history of migraine. Patient denies any jaw claudication, eye pain, facial pain.  Patient has had chronic knee problems with left knee replacement 2008, right knee replacement 2015, left knee revision surgery in 2017. Patient is on chronic narcotics (hydrocodone) for knee pain. She also has osteoarthritis affecting her fingers and distal interphalangeal joints. She sees a rheumatologist.     REVIEW OF SYSTEMS: Full 14 system review of systems performed and negative with exception of: Joint pain headache ringing in ears left greater than right, weight gain.  ALLERGIES: Allergies  Allergen Reactions  . Vancomycin Rash    HOME MEDICATIONS: Outpatient Medications Prior to Visit  Medication Sig Dispense Refill  . Amlodipine-Valsartan-HCTZ (EXFORGE HCT) 10-320-25 MG TABS  Take 1 tablet by mouth every morning.    . celecoxib (CELEBREX) 200 MG capsule Take 1 capsule (200 mg total) by mouth every 12 (twelve) hours. 60 capsule 0  . cholecalciferol (VITAMIN D) 1000 units tablet Take 1,000 Units by mouth every morning.    . estrogens, conjugated, (PREMARIN) 0.45 MG tablet Take 0.45 mg by mouth every morning. Reported on 01/01/2016    . sertraline (ZOLOFT) 50 MG tablet Take 50 mg by mouth every morning.    . zolpidem (AMBIEN CR) 12.5 MG CR tablet Take 12.5 mg by mouth at bedtime as needed for sleep.     . Multiple Vitamin (MULTIVITAMIN WITH MINERALS) TABS tablet Take 1 tablet by mouth every morning.    . docusate sodium (COLACE) 100 MG capsule Take 1 capsule (100 mg total) by mouth 2 (two) times daily. 10 capsule 0  . ferrous sulfate 325 (65 FE) MG tablet Take 1 tablet (325 mg total) by mouth 3 (three) times daily after meals.  3  . methocarbamol (ROBAXIN) 500 MG tablet Take 1 tablet (500 mg total) by mouth every 6 (six) hours as needed for muscle spasms. 50 tablet 0  . oxyCODONE (OXY IR/ROXICODONE) 5 MG immediate release tablet Take 1-3 tablets (5-15 mg total) by mouth every 4 (four) hours as needed for severe pain. 120 tablet 0  . polyethylene glycol (MIRALAX / GLYCOLAX) packet Take 17 g by mouth 2 (two) times daily. 14 each 0   No facility-administered medications prior to visit.     PAST MEDICAL HISTORY: Past Medical History:  Diagnosis Date  . Anxiety   . Arthritis KNEES AND HANDS  .  Bladder tumor    benign  . Blood transfusion   . DDD (degenerative disc disease)   . DDD (degenerative disc disease), lumbar   . Hypertension   . Normal cardiac stress test 08-10-2004  . OSA on CPAP    uses cpap, pt does not know settings  . Urgency of urination     PAST SURGICAL HISTORY: Past Surgical History:  Procedure Laterality Date  . ABDOMINAL HYSTERECTOMY  1994   W/ LSO  . ANTERIOR CRUCIATE LIGAMENT REPAIR     LEFT  . BREAST SURGERY  07/10/11   left breast  excisional biopsy-- benign  . [place]   X2  . DILATION AND CURETTAGE OF UTERUS     FOR MISSED AB  . DISTAL INTERPHALANGEAL JOINT FUSION Right 06/08/2015   Procedure: ARTHROTOMY RIGHT MIDDLE FINGER;  Surgeon: Daryll Brod, MD;  Location: [facility];  Service: Orthopedics;  Laterality: Right;  . GANGLION CYST EXCISION     LEFT WRIST  . KNEE ARTHROSCOPY  04-02-2010   RIGHT  . KNEE ARTHROSCOPY W/ MENISCECTOMY  02-20-2006   LEFT  . left foot bone spur removed and tendon release plate inserted  sept 2016  . LEFT SHOULDER ARTHROSCOPY/ DEBRIDEMENT LABRUM/ SAD/ OPEN DISTAL CLAVICLE RESECTION  03-25-2011  . MASS EXCISION Right 06/08/2015   Procedure: EXCISION OF MUCOID CYST RIGHT MIDDLE FINGER;  Surgeon: Daryll Brod, MD;  Location: [facility];  Service: Orthopedics;  Laterality: Right;  . REIMPLANTATION OF TOTAL KNEE Left 01/15/2016   Procedure: REIMPLANTATION OF LEFT TOTAL KNEE;  Surgeon: Paralee Cancel, MD;  Location: [facility];  Service: Orthopedics;  Laterality: Left;  . ROTATOR CUFF REPAIR  12-23-2008   RIGHT-- W/ TISSUEMEND GRAFT  . TONSILLECTOMY  CHILD  . TOTAL KNEE ARTHROPLASTY  09-16-2007   LEFT  . TOTAL KNEE ARTHROPLASTY Right 11/18/2013   Procedure: RIGHT TOTAL KNEE ARTHROPLASTY;  Surgeon: Tobi Bastos, MD;  Location: [facility];  Service: Orthopedics;  Laterality: Right;  . TOTAL KNEE REVISION Left 11/27/2015   Procedure: resection left total knee placement of spacers;  Surgeon: Paralee Cancel, MD;  Location: [facility];  Service: Orthopedics;  Laterality: Left;  . TRANSURETHRAL RESECTION OF BLADDER TUMOR  12/27/2011   Procedure: TRANSURETHRAL RESECTION OF BLADDER TUMOR (TURBT);  Surgeon: Claybon Jabs, MD;  Location: [facility];  Service: Urology;  Laterality: N/A;  mytomicin c  gyrus   . TUBAL LIGATION      FAMILY HISTORY: Family History  Problem Relation Age of Onset  . Cancer Mother     colon  . Hypertension Mother   . Heart  murmur Mother   . Cancer Father     lung  . Hypertension Father   . Diabetes Father   . Cancer Brother     rectal    SOCIAL HISTORY:  Social History   Social History  . Marital status: Married    Spouse name: Clare Gandy  . Number of children: 2  . Years of education: 13   Occupational History  .      [employer]   Social History Main Topics  . Smoking status: Former Smoker    Packs/day: 0.25    Years: 50.00    Types: Cigarettes    Quit date: 06/25/1980  . Smokeless tobacco: Never Used  . Alcohol use 0.6 oz/week    1 Glasses of wine per week     Comment: occasional  . Drug use: No  .  Sexual activity: Yes    Birth control/ protection: Post-menopausal   Other Topics Concern  . Not on file   Social History Narrative   Lives at home with spouse   Caffeine - coffee, 1 cup daily     PHYSICAL EXAM   GENERAL EXAM/CONSTITUTIONAL: Vitals:  Vitals:   07/16/16 0848  BP: 124/70  Pulse: 81  Weight: 165 lb (74.8 kg)  Height: 5' 3.5" (1.613 m)     Body mass index is 28.77 kg/m.  Visual Acuity Screening   Right eye Left eye Both eyes  Without correction:     With correction: 20/40 20/30      Patient is in no distress; well developed, nourished and groomed; neck is supple  CARDIOVASCULAR:  Examination of carotid arteries is normal; no carotid bruits  Regular rate and rhythm, no murmurs  Examination of peripheral vascular system by observation and palpation is normal  DUSKY FINGERTIPS, BUT WARM TO TOUCH  DIP ARTHRITIS IN FINGERS WITH SWAN-NECK DEFORMITY  EYES:  Ophthalmoscopic exam of optic discs and posterior segments is normal; no papilledema or hemorrhages  MUSCULOSKELETAL:  Gait, strength, tone, movements noted in Neurologic exam below  NEUROLOGIC: MENTAL STATUS:  No flowsheet data found.  awake, alert, oriented to person, place and time  recent and remote memory intact  normal attention and concentration  language fluent, comprehension intact,  naming intact,   fund of knowledge appropriate  CRANIAL NERVE:   2nd - no papilledema on fundoscopic exam  2nd, 3rd, 4th, 6th - pupils equal and reactive to light, visual fields full to confrontation, extraocular muscles intact, no nystagmus  5th - facial sensation symmetric  7th - facial strength symmetric  8th - hearing intact  9th - palate elevates symmetrically, uvula midline  11th - shoulder shrug symmetric  12th - tongue protrusion midline  MOTOR:   normal bulk and tone, full strength in the BUE, BLE  SENSORY:   normal and symmetric to light touch, temperature, vibration  COORDINATION:   finger-nose-finger, fine finger movements normal  REFLEXES:   deep tendon reflexes present and symmetric  EXCEPT ABSENT IN RIGHT ANKLE AND LEFT KNEE  GAIT/STATION:   narrow based gait; romberg is negative    DIAGNOSTIC DATA (LABS, IMAGING, TESTING) - I reviewed patient records, labs, notes, testing and imaging myself where available.  Lab Results  Component Value Date   WBC 9.4 01/17/2016   HGB 9.1 (L) 01/17/2016   HCT 26.9 (L) 01/17/2016   MCV 86.5 01/17/2016   PLT 173 01/17/2016      Component Value Date/Time   NA 140 01/17/2016 0348   K 4.2 01/17/2016 0348   CL 106 01/17/2016 0348   CO2 27 01/17/2016 0348   GLUCOSE 144 (H) 01/17/2016 0348   BUN 19 01/17/2016 0348   CREATININE 0.67 01/17/2016 0348   CREATININE 0.75 01/04/2016 1151   CALCIUM 8.8 (L) 01/17/2016 0348   PROT 7.1 11/15/2013 0930   ALBUMIN 3.9 11/15/2013 0930   AST 22 11/15/2013 0930   ALT 35 11/15/2013 0930   ALKPHOS 102 11/15/2013 0930   BILITOT <0.2 (L) 11/15/2013 0930   GFRNONAA >60 01/17/2016 0348   GFRAA >60 01/17/2016 0348   No results found for: CHOL, HDL, LDLCALC, LDLDIRECT, TRIG, CHOLHDL No results found for: HGBA1C No results found for: VITAMINB12 No results found for: TSH       ASSESSMENT AND PLAN  65 y.o. year old female here with new onset right-sided  intermittent headaches for past 6  months with features consistent with paroxysmal hemicrania. Symptoms are spontaneously improving. I offered to check additional testing and try medication the patient would like to hold off for now. She will contact me with any change or worsening of symptoms, and then we will order additional testing and have her come in for expedited follow-up. Patient feels comfortable with assessment and plan.   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 Right-sided headache      PLAN: - offered to check MRI brain and cervical spine, and trial of gabapentin and indomethacin; however since symptoms are spontaneously improving, patient requesting to hold off for now and monitor symptoms  Return if symptoms worsen or fail to improve. Return as needed.     Penni Bombard, MD [id number], [id number] AM Certified in Neurology, Neurophysiology and Neuroimaging  [facility] [address] [place], [place] [address] [phone]

## 2016-07-16 NOTE — Patient Instructions (Addendum)
Thank you for coming to see us at [facility]. I hope we have been able to provide you high quality care today.  You may receive a patient satisfaction survey over the next few weeks. We would appreciate your feedback and comments so that we may continue to improve ourselves and the health of our patients.  - consider MRI brain and cervical spine, and trial of gabapentin and indomethacin - since your symptoms are spontaneously improving, of to hold off for now and monitor symptoms   ~~~~~~~~~~~~~~~~~~~~~~~~~~~~~~~~~~~~~~~~~~~~~~~~~~~~~~~~~~~~~~~~~  DR. PENUMALLI'S GUIDE TO HAPPY AND HEALTHY LIVING These are some of my general health and wellness recommendations. Some of them may apply to you better than others. Please use common sense as you try these suggestions and feel free to ask me any questions.   ACTIVITY/FITNESS Mental, social, emotional and physical stimulation are very important for brain and body health. Try learning a new activity (arts, music, language, sports, games).  Keep moving your body to the best of your abilities. You can do this at home, inside or outside, the park, community center, gym or anywhere you like. Consider a physical therapist or personal trainer to get started. Consider the app Sworkit. Fitness trackers such as smart-watches, smart-phones or Fitbits can help as well.   NUTRITION Eat more plants: colorful vegetables, nuts, seeds and berries.  Eat less sugar, salt, preservatives and processed foods.  Avoid toxins such as cigarettes and alcohol.  Drink water when you are thirsty. Warm water with a slice of lemon is an excellent morning drink to start the day.  Consider these websites for more information The Nutrition Source (https://www.hsph.[employer].edu/nutritionsource) Precision Nutrition (www.precisionnutrition.com/blog/infographics)   RELAXATION Consider practicing mindfulness meditation or other relaxation techniques such as  deep breathing, prayer, yoga, tai chi, massage. See website mindful.org or the apps Headspace or Calm to help get started.   SLEEP Try to get at least 7-8+ hours sleep per day. Regular exercise and reduced caffeine will help you sleep better. Practice good sleep hygeine techniques. See website sleep.org for more information.   PLANNING Prepare estate planning, living will, healthcare POA documents. Sometimes this is best planned with the help of an attorney. Theconversationproject.org and agingwithdignity.org are excellent resources.  

## 2016-07-29 DIAGNOSIS — M150 Primary generalized (osteo)arthritis: Secondary | ICD-10-CM | POA: Diagnosis not present

## 2016-07-29 DIAGNOSIS — M5136 Other intervertebral disc degeneration, lumbar region: Secondary | ICD-10-CM | POA: Diagnosis not present

## 2016-10-28 DIAGNOSIS — Z6827 Body mass index (BMI) 27.0-27.9, adult: Secondary | ICD-10-CM | POA: Diagnosis not present

## 2016-10-28 DIAGNOSIS — E663 Overweight: Secondary | ICD-10-CM | POA: Diagnosis not present

## 2016-10-28 DIAGNOSIS — M180 Bilateral primary osteoarthritis of first carpometacarpal joints: Secondary | ICD-10-CM | POA: Diagnosis not present

## 2016-10-28 DIAGNOSIS — M5136 Other intervertebral disc degeneration, lumbar region: Secondary | ICD-10-CM | POA: Diagnosis not present

## 2016-10-28 DIAGNOSIS — M150 Primary generalized (osteo)arthritis: Secondary | ICD-10-CM | POA: Diagnosis not present

## 2016-11-25 DIAGNOSIS — Z1231 Encounter for screening mammogram for malignant neoplasm of breast: Secondary | ICD-10-CM | POA: Diagnosis not present

## 2016-11-28 DIAGNOSIS — N6320 Unspecified lump in the left breast, unspecified quadrant: Secondary | ICD-10-CM | POA: Diagnosis not present

## 2016-12-10 DIAGNOSIS — M722 Plantar fascial fibromatosis: Secondary | ICD-10-CM | POA: Diagnosis not present

## 2017-01-27 DIAGNOSIS — M180 Bilateral primary osteoarthritis of first carpometacarpal joints: Secondary | ICD-10-CM | POA: Diagnosis not present

## 2017-01-27 DIAGNOSIS — M5136 Other intervertebral disc degeneration, lumbar region: Secondary | ICD-10-CM | POA: Diagnosis not present

## 2017-01-27 DIAGNOSIS — M150 Primary generalized (osteo)arthritis: Secondary | ICD-10-CM | POA: Diagnosis not present

## 2017-01-27 DIAGNOSIS — Z6828 Body mass index (BMI) 28.0-28.9, adult: Secondary | ICD-10-CM | POA: Diagnosis not present

## 2017-01-27 DIAGNOSIS — E663 Overweight: Secondary | ICD-10-CM | POA: Diagnosis not present

## 2017-02-18 DIAGNOSIS — Z96652 Presence of left artificial knee joint: Secondary | ICD-10-CM | POA: Diagnosis not present

## 2017-02-18 DIAGNOSIS — Z471 Aftercare following joint replacement surgery: Secondary | ICD-10-CM | POA: Diagnosis not present

## 2017-02-26 ENCOUNTER — Ambulatory Visit: Payer: Medicare Other | Admitting: Podiatry

## 2017-03-20 DIAGNOSIS — M25572 Pain in left ankle and joints of left foot: Secondary | ICD-10-CM | POA: Diagnosis not present

## 2017-03-20 DIAGNOSIS — M7671 Peroneal tendinitis, right leg: Secondary | ICD-10-CM | POA: Diagnosis not present

## 2017-03-20 DIAGNOSIS — M7672 Peroneal tendinitis, left leg: Secondary | ICD-10-CM | POA: Diagnosis not present

## 2017-04-01 DIAGNOSIS — J029 Acute pharyngitis, unspecified: Secondary | ICD-10-CM | POA: Diagnosis not present

## 2017-04-01 DIAGNOSIS — L821 Other seborrheic keratosis: Secondary | ICD-10-CM | POA: Diagnosis not present

## 2017-04-01 DIAGNOSIS — L820 Inflamed seborrheic keratosis: Secondary | ICD-10-CM | POA: Diagnosis not present

## 2017-04-01 DIAGNOSIS — D239 Other benign neoplasm of skin, unspecified: Secondary | ICD-10-CM | POA: Diagnosis not present

## 2017-04-01 DIAGNOSIS — C44319 Basal cell carcinoma of skin of other parts of face: Secondary | ICD-10-CM | POA: Diagnosis not present

## 2017-04-01 DIAGNOSIS — J309 Allergic rhinitis, unspecified: Secondary | ICD-10-CM | POA: Diagnosis not present

## 2017-04-01 DIAGNOSIS — F419 Anxiety disorder, unspecified: Secondary | ICD-10-CM | POA: Diagnosis not present

## 2017-04-01 DIAGNOSIS — R232 Flushing: Secondary | ICD-10-CM | POA: Diagnosis not present

## 2017-04-01 DIAGNOSIS — D1801 Hemangioma of skin and subcutaneous tissue: Secondary | ICD-10-CM | POA: Diagnosis not present

## 2017-04-01 DIAGNOSIS — L918 Other hypertrophic disorders of the skin: Secondary | ICD-10-CM | POA: Diagnosis not present

## 2017-05-05 DIAGNOSIS — M5136 Other intervertebral disc degeneration, lumbar region: Secondary | ICD-10-CM | POA: Diagnosis not present

## 2017-05-05 DIAGNOSIS — E663 Overweight: Secondary | ICD-10-CM | POA: Diagnosis not present

## 2017-05-05 DIAGNOSIS — M180 Bilateral primary osteoarthritis of first carpometacarpal joints: Secondary | ICD-10-CM | POA: Diagnosis not present

## 2017-05-05 DIAGNOSIS — M150 Primary generalized (osteo)arthritis: Secondary | ICD-10-CM | POA: Diagnosis not present

## 2017-05-05 DIAGNOSIS — Z6828 Body mass index (BMI) 28.0-28.9, adult: Secondary | ICD-10-CM | POA: Diagnosis not present

## 2017-06-05 DIAGNOSIS — R749 Abnormal serum enzyme level, unspecified: Secondary | ICD-10-CM | POA: Diagnosis not present

## 2017-06-05 DIAGNOSIS — I10 Essential (primary) hypertension: Secondary | ICD-10-CM | POA: Diagnosis not present

## 2017-06-05 DIAGNOSIS — Z1159 Encounter for screening for other viral diseases: Secondary | ICD-10-CM | POA: Diagnosis not present

## 2017-06-05 DIAGNOSIS — C44319 Basal cell carcinoma of skin of other parts of face: Secondary | ICD-10-CM | POA: Diagnosis not present

## 2017-06-05 DIAGNOSIS — E559 Vitamin D deficiency, unspecified: Secondary | ICD-10-CM | POA: Diagnosis not present

## 2017-06-05 DIAGNOSIS — R945 Abnormal results of liver function studies: Secondary | ICD-10-CM | POA: Diagnosis not present

## 2017-06-09 DIAGNOSIS — I10 Essential (primary) hypertension: Secondary | ICD-10-CM | POA: Diagnosis not present

## 2017-06-09 DIAGNOSIS — Z23 Encounter for immunization: Secondary | ICD-10-CM | POA: Diagnosis not present

## 2017-06-09 DIAGNOSIS — M79671 Pain in right foot: Secondary | ICD-10-CM | POA: Diagnosis not present

## 2017-06-09 DIAGNOSIS — R748 Abnormal levels of other serum enzymes: Secondary | ICD-10-CM | POA: Diagnosis not present

## 2017-06-09 DIAGNOSIS — Z0001 Encounter for general adult medical examination with abnormal findings: Secondary | ICD-10-CM | POA: Diagnosis not present

## 2017-06-09 DIAGNOSIS — G4733 Obstructive sleep apnea (adult) (pediatric): Secondary | ICD-10-CM | POA: Diagnosis not present

## 2017-06-09 DIAGNOSIS — N6002 Solitary cyst of left breast: Secondary | ICD-10-CM | POA: Diagnosis not present

## 2017-06-09 DIAGNOSIS — M5030 Other cervical disc degeneration, unspecified cervical region: Secondary | ICD-10-CM | POA: Diagnosis not present

## 2017-06-09 DIAGNOSIS — G4700 Insomnia, unspecified: Secondary | ICD-10-CM | POA: Diagnosis not present

## 2017-06-09 DIAGNOSIS — M159 Polyosteoarthritis, unspecified: Secondary | ICD-10-CM | POA: Diagnosis not present

## 2017-06-09 DIAGNOSIS — F419 Anxiety disorder, unspecified: Secondary | ICD-10-CM | POA: Diagnosis not present

## 2017-06-09 DIAGNOSIS — E559 Vitamin D deficiency, unspecified: Secondary | ICD-10-CM | POA: Diagnosis not present

## 2017-06-09 DIAGNOSIS — N6320 Unspecified lump in the left breast, unspecified quadrant: Secondary | ICD-10-CM | POA: Diagnosis not present

## 2017-06-09 DIAGNOSIS — R5383 Other fatigue: Secondary | ICD-10-CM | POA: Diagnosis not present

## 2017-06-24 DIAGNOSIS — I10 Essential (primary) hypertension: Secondary | ICD-10-CM | POA: Diagnosis not present

## 2017-07-21 DIAGNOSIS — Z23 Encounter for immunization: Secondary | ICD-10-CM | POA: Diagnosis not present

## 2017-07-21 DIAGNOSIS — I10 Essential (primary) hypertension: Secondary | ICD-10-CM | POA: Diagnosis not present

## 2017-07-28 DIAGNOSIS — Z471 Aftercare following joint replacement surgery: Secondary | ICD-10-CM | POA: Diagnosis not present

## 2017-07-28 DIAGNOSIS — Z96652 Presence of left artificial knee joint: Secondary | ICD-10-CM | POA: Diagnosis not present

## 2017-07-28 DIAGNOSIS — M25562 Pain in left knee: Secondary | ICD-10-CM | POA: Diagnosis not present

## 2017-08-04 DIAGNOSIS — E663 Overweight: Secondary | ICD-10-CM | POA: Diagnosis not present

## 2017-08-04 DIAGNOSIS — M180 Bilateral primary osteoarthritis of first carpometacarpal joints: Secondary | ICD-10-CM | POA: Diagnosis not present

## 2017-08-04 DIAGNOSIS — M5136 Other intervertebral disc degeneration, lumbar region: Secondary | ICD-10-CM | POA: Diagnosis not present

## 2017-08-04 DIAGNOSIS — M150 Primary generalized (osteo)arthritis: Secondary | ICD-10-CM | POA: Diagnosis not present

## 2017-08-04 DIAGNOSIS — Z6828 Body mass index (BMI) 28.0-28.9, adult: Secondary | ICD-10-CM | POA: Diagnosis not present

## 2017-10-27 DIAGNOSIS — Z6830 Body mass index (BMI) 30.0-30.9, adult: Secondary | ICD-10-CM | POA: Diagnosis not present

## 2017-10-27 DIAGNOSIS — E669 Obesity, unspecified: Secondary | ICD-10-CM | POA: Diagnosis not present

## 2017-10-27 DIAGNOSIS — M5136 Other intervertebral disc degeneration, lumbar region: Secondary | ICD-10-CM | POA: Diagnosis not present

## 2017-10-27 DIAGNOSIS — M180 Bilateral primary osteoarthritis of first carpometacarpal joints: Secondary | ICD-10-CM | POA: Diagnosis not present

## 2017-10-27 DIAGNOSIS — M150 Primary generalized (osteo)arthritis: Secondary | ICD-10-CM | POA: Diagnosis not present

## 2017-11-06 DIAGNOSIS — R748 Abnormal levels of other serum enzymes: Secondary | ICD-10-CM | POA: Diagnosis not present

## 2017-11-10 DIAGNOSIS — K760 Fatty (change of) liver, not elsewhere classified: Secondary | ICD-10-CM | POA: Diagnosis not present

## 2017-11-20 DIAGNOSIS — R748 Abnormal levels of other serum enzymes: Secondary | ICD-10-CM | POA: Diagnosis not present

## 2017-12-04 DIAGNOSIS — M67861 Other specified disorders of synovium, right knee: Secondary | ICD-10-CM | POA: Diagnosis not present

## 2017-12-04 DIAGNOSIS — M25571 Pain in right ankle and joints of right foot: Secondary | ICD-10-CM | POA: Diagnosis not present

## 2017-12-04 DIAGNOSIS — M79671 Pain in right foot: Secondary | ICD-10-CM | POA: Diagnosis not present

## 2017-12-04 DIAGNOSIS — M67969 Unspecified disorder of synovium and tendon, unspecified lower leg: Secondary | ICD-10-CM | POA: Diagnosis not present

## 2017-12-10 DIAGNOSIS — M5431 Sciatica, right side: Secondary | ICD-10-CM | POA: Diagnosis not present

## 2017-12-11 DIAGNOSIS — G5701 Lesion of sciatic nerve, right lower limb: Secondary | ICD-10-CM | POA: Diagnosis not present

## 2017-12-19 DIAGNOSIS — M67962 Unspecified disorder of synovium and tendon, left lower leg: Secondary | ICD-10-CM | POA: Diagnosis not present

## 2017-12-19 DIAGNOSIS — M67969 Unspecified disorder of synovium and tendon, unspecified lower leg: Secondary | ICD-10-CM | POA: Diagnosis not present

## 2017-12-19 DIAGNOSIS — M67961 Unspecified disorder of synovium and tendon, right lower leg: Secondary | ICD-10-CM | POA: Diagnosis not present

## 2017-12-22 DIAGNOSIS — Z1231 Encounter for screening mammogram for malignant neoplasm of breast: Secondary | ICD-10-CM | POA: Diagnosis not present

## 2017-12-22 DIAGNOSIS — M67961 Unspecified disorder of synovium and tendon, right lower leg: Secondary | ICD-10-CM | POA: Diagnosis not present

## 2017-12-24 DIAGNOSIS — M67969 Unspecified disorder of synovium and tendon, unspecified lower leg: Secondary | ICD-10-CM | POA: Diagnosis not present

## 2017-12-31 DIAGNOSIS — M67961 Unspecified disorder of synovium and tendon, right lower leg: Secondary | ICD-10-CM | POA: Diagnosis not present

## 2017-12-31 DIAGNOSIS — M67969 Unspecified disorder of synovium and tendon, unspecified lower leg: Secondary | ICD-10-CM | POA: Diagnosis not present

## 2018-01-01 DIAGNOSIS — Z01419 Encounter for gynecological examination (general) (routine) without abnormal findings: Secondary | ICD-10-CM | POA: Diagnosis not present

## 2018-01-01 DIAGNOSIS — R1030 Lower abdominal pain, unspecified: Secondary | ICD-10-CM | POA: Diagnosis not present

## 2018-01-06 DIAGNOSIS — M67969 Unspecified disorder of synovium and tendon, unspecified lower leg: Secondary | ICD-10-CM | POA: Diagnosis not present

## 2018-01-12 DIAGNOSIS — M67961 Unspecified disorder of synovium and tendon, right lower leg: Secondary | ICD-10-CM | POA: Diagnosis not present

## 2018-01-12 DIAGNOSIS — M67969 Unspecified disorder of synovium and tendon, unspecified lower leg: Secondary | ICD-10-CM | POA: Diagnosis not present

## 2018-01-16 DIAGNOSIS — M67961 Unspecified disorder of synovium and tendon, right lower leg: Secondary | ICD-10-CM | POA: Diagnosis not present

## 2018-01-16 DIAGNOSIS — M67969 Unspecified disorder of synovium and tendon, unspecified lower leg: Secondary | ICD-10-CM | POA: Diagnosis not present

## 2018-01-26 DIAGNOSIS — M150 Primary generalized (osteo)arthritis: Secondary | ICD-10-CM | POA: Diagnosis not present

## 2018-01-26 DIAGNOSIS — E663 Overweight: Secondary | ICD-10-CM | POA: Diagnosis not present

## 2018-01-26 DIAGNOSIS — Z6829 Body mass index (BMI) 29.0-29.9, adult: Secondary | ICD-10-CM | POA: Diagnosis not present

## 2018-01-26 DIAGNOSIS — M180 Bilateral primary osteoarthritis of first carpometacarpal joints: Secondary | ICD-10-CM | POA: Diagnosis not present

## 2018-01-26 DIAGNOSIS — M5136 Other intervertebral disc degeneration, lumbar region: Secondary | ICD-10-CM | POA: Diagnosis not present

## 2018-03-10 DIAGNOSIS — B029 Zoster without complications: Secondary | ICD-10-CM | POA: Diagnosis not present

## 2018-03-12 DIAGNOSIS — B029 Zoster without complications: Secondary | ICD-10-CM | POA: Diagnosis not present

## 2018-03-16 DIAGNOSIS — G4700 Insomnia, unspecified: Secondary | ICD-10-CM | POA: Diagnosis not present

## 2018-03-16 DIAGNOSIS — B029 Zoster without complications: Secondary | ICD-10-CM | POA: Diagnosis not present

## 2018-04-27 DIAGNOSIS — E663 Overweight: Secondary | ICD-10-CM | POA: Diagnosis not present

## 2018-04-27 DIAGNOSIS — Z6829 Body mass index (BMI) 29.0-29.9, adult: Secondary | ICD-10-CM | POA: Diagnosis not present

## 2018-04-27 DIAGNOSIS — M150 Primary generalized (osteo)arthritis: Secondary | ICD-10-CM | POA: Diagnosis not present

## 2018-04-27 DIAGNOSIS — M5136 Other intervertebral disc degeneration, lumbar region: Secondary | ICD-10-CM | POA: Diagnosis not present

## 2018-04-27 DIAGNOSIS — M180 Bilateral primary osteoarthritis of first carpometacarpal joints: Secondary | ICD-10-CM | POA: Diagnosis not present

## 2018-06-22 DIAGNOSIS — F419 Anxiety disorder, unspecified: Secondary | ICD-10-CM | POA: Diagnosis not present

## 2018-06-22 DIAGNOSIS — Z0000 Encounter for general adult medical examination without abnormal findings: Secondary | ICD-10-CM | POA: Diagnosis not present

## 2018-06-22 DIAGNOSIS — E559 Vitamin D deficiency, unspecified: Secondary | ICD-10-CM | POA: Diagnosis not present

## 2018-06-22 DIAGNOSIS — Z6830 Body mass index (BMI) 30.0-30.9, adult: Secondary | ICD-10-CM | POA: Diagnosis not present

## 2018-06-22 DIAGNOSIS — G4733 Obstructive sleep apnea (adult) (pediatric): Secondary | ICD-10-CM | POA: Diagnosis not present

## 2018-06-22 DIAGNOSIS — Z23 Encounter for immunization: Secondary | ICD-10-CM | POA: Diagnosis not present

## 2018-06-22 DIAGNOSIS — B078 Other viral warts: Secondary | ICD-10-CM | POA: Diagnosis not present

## 2018-06-22 DIAGNOSIS — G4700 Insomnia, unspecified: Secondary | ICD-10-CM | POA: Diagnosis not present

## 2018-06-22 DIAGNOSIS — M5030 Other cervical disc degeneration, unspecified cervical region: Secondary | ICD-10-CM | POA: Diagnosis not present

## 2018-06-22 DIAGNOSIS — R0789 Other chest pain: Secondary | ICD-10-CM | POA: Diagnosis not present

## 2018-06-22 DIAGNOSIS — I10 Essential (primary) hypertension: Secondary | ICD-10-CM | POA: Diagnosis not present

## 2018-06-22 DIAGNOSIS — Z780 Asymptomatic menopausal state: Secondary | ICD-10-CM | POA: Diagnosis not present

## 2018-06-23 DIAGNOSIS — E559 Vitamin D deficiency, unspecified: Secondary | ICD-10-CM | POA: Diagnosis not present

## 2018-06-23 DIAGNOSIS — I10 Essential (primary) hypertension: Secondary | ICD-10-CM | POA: Diagnosis not present

## 2018-06-23 DIAGNOSIS — R945 Abnormal results of liver function studies: Secondary | ICD-10-CM | POA: Diagnosis not present

## 2018-07-02 DIAGNOSIS — R0609 Other forms of dyspnea: Secondary | ICD-10-CM | POA: Diagnosis not present

## 2018-07-02 DIAGNOSIS — Z0189 Encounter for other specified special examinations: Secondary | ICD-10-CM | POA: Diagnosis not present

## 2018-07-02 DIAGNOSIS — R0789 Other chest pain: Secondary | ICD-10-CM | POA: Diagnosis not present

## 2018-07-02 DIAGNOSIS — I10 Essential (primary) hypertension: Secondary | ICD-10-CM | POA: Diagnosis not present

## 2018-07-09 DIAGNOSIS — R079 Chest pain, unspecified: Secondary | ICD-10-CM | POA: Diagnosis not present

## 2018-07-09 DIAGNOSIS — I10 Essential (primary) hypertension: Secondary | ICD-10-CM | POA: Diagnosis not present

## 2018-07-20 DIAGNOSIS — I10 Essential (primary) hypertension: Secondary | ICD-10-CM | POA: Diagnosis not present

## 2018-07-20 DIAGNOSIS — R0789 Other chest pain: Secondary | ICD-10-CM | POA: Diagnosis not present

## 2018-07-22 DIAGNOSIS — G4733 Obstructive sleep apnea (adult) (pediatric): Secondary | ICD-10-CM | POA: Diagnosis not present

## 2018-07-28 DIAGNOSIS — M5136 Other intervertebral disc degeneration, lumbar region: Secondary | ICD-10-CM | POA: Diagnosis not present

## 2018-07-28 DIAGNOSIS — Z6830 Body mass index (BMI) 30.0-30.9, adult: Secondary | ICD-10-CM | POA: Diagnosis not present

## 2018-07-28 DIAGNOSIS — E669 Obesity, unspecified: Secondary | ICD-10-CM | POA: Diagnosis not present

## 2018-07-28 DIAGNOSIS — M150 Primary generalized (osteo)arthritis: Secondary | ICD-10-CM | POA: Diagnosis not present

## 2018-07-28 DIAGNOSIS — M180 Bilateral primary osteoarthritis of first carpometacarpal joints: Secondary | ICD-10-CM | POA: Diagnosis not present

## 2018-08-06 DIAGNOSIS — M76892 Other specified enthesopathies of left lower limb, excluding foot: Secondary | ICD-10-CM | POA: Diagnosis not present

## 2018-08-06 DIAGNOSIS — M25562 Pain in left knee: Secondary | ICD-10-CM | POA: Diagnosis not present

## 2018-08-13 DIAGNOSIS — H6121 Impacted cerumen, right ear: Secondary | ICD-10-CM | POA: Diagnosis not present

## 2018-08-13 DIAGNOSIS — I10 Essential (primary) hypertension: Secondary | ICD-10-CM | POA: Diagnosis not present

## 2018-08-20 DIAGNOSIS — I10 Essential (primary) hypertension: Secondary | ICD-10-CM | POA: Diagnosis not present

## 2018-08-22 DIAGNOSIS — G4733 Obstructive sleep apnea (adult) (pediatric): Secondary | ICD-10-CM | POA: Diagnosis not present

## 2018-09-17 DIAGNOSIS — I10 Essential (primary) hypertension: Secondary | ICD-10-CM | POA: Diagnosis not present

## 2018-09-17 DIAGNOSIS — M542 Cervicalgia: Secondary | ICD-10-CM | POA: Diagnosis not present

## 2018-09-21 DIAGNOSIS — G4733 Obstructive sleep apnea (adult) (pediatric): Secondary | ICD-10-CM | POA: Diagnosis not present

## 2018-10-22 DIAGNOSIS — G4733 Obstructive sleep apnea (adult) (pediatric): Secondary | ICD-10-CM | POA: Diagnosis not present

## 2018-10-26 DIAGNOSIS — G4700 Insomnia, unspecified: Secondary | ICD-10-CM | POA: Diagnosis not present

## 2018-10-26 DIAGNOSIS — Z01419 Encounter for gynecological examination (general) (routine) without abnormal findings: Secondary | ICD-10-CM | POA: Diagnosis not present

## 2018-10-26 DIAGNOSIS — I10 Essential (primary) hypertension: Secondary | ICD-10-CM | POA: Diagnosis not present

## 2018-10-26 DIAGNOSIS — Z13820 Encounter for screening for osteoporosis: Secondary | ICD-10-CM | POA: Diagnosis not present

## 2018-10-26 DIAGNOSIS — Z1212 Encounter for screening for malignant neoplasm of rectum: Secondary | ICD-10-CM | POA: Diagnosis not present

## 2018-10-28 DIAGNOSIS — E669 Obesity, unspecified: Secondary | ICD-10-CM | POA: Diagnosis not present

## 2018-10-28 DIAGNOSIS — Z6830 Body mass index (BMI) 30.0-30.9, adult: Secondary | ICD-10-CM | POA: Diagnosis not present

## 2018-10-28 DIAGNOSIS — M180 Bilateral primary osteoarthritis of first carpometacarpal joints: Secondary | ICD-10-CM | POA: Diagnosis not present

## 2018-10-28 DIAGNOSIS — M150 Primary generalized (osteo)arthritis: Secondary | ICD-10-CM | POA: Diagnosis not present

## 2018-10-28 DIAGNOSIS — M5136 Other intervertebral disc degeneration, lumbar region: Secondary | ICD-10-CM | POA: Diagnosis not present

## 2018-11-22 DIAGNOSIS — G4733 Obstructive sleep apnea (adult) (pediatric): Secondary | ICD-10-CM | POA: Diagnosis not present

## 2018-11-23 DIAGNOSIS — M7751 Other enthesopathy of right foot: Secondary | ICD-10-CM | POA: Diagnosis not present

## 2018-11-23 DIAGNOSIS — Z4789 Encounter for other orthopedic aftercare: Secondary | ICD-10-CM | POA: Diagnosis not present

## 2018-11-23 DIAGNOSIS — M25871 Other specified joint disorders, right ankle and foot: Secondary | ICD-10-CM | POA: Diagnosis not present

## 2018-12-02 DIAGNOSIS — R69 Illness, unspecified: Secondary | ICD-10-CM | POA: Diagnosis not present

## 2018-12-02 DIAGNOSIS — I10 Essential (primary) hypertension: Secondary | ICD-10-CM | POA: Diagnosis not present

## 2018-12-02 DIAGNOSIS — G4700 Insomnia, unspecified: Secondary | ICD-10-CM | POA: Diagnosis not present

## 2018-12-02 DIAGNOSIS — Z780 Asymptomatic menopausal state: Secondary | ICD-10-CM | POA: Diagnosis not present

## 2018-12-02 DIAGNOSIS — M1990 Unspecified osteoarthritis, unspecified site: Secondary | ICD-10-CM | POA: Diagnosis not present

## 2018-12-02 DIAGNOSIS — G8929 Other chronic pain: Secondary | ICD-10-CM | POA: Diagnosis not present

## 2018-12-02 DIAGNOSIS — E669 Obesity, unspecified: Secondary | ICD-10-CM | POA: Diagnosis not present

## 2018-12-02 DIAGNOSIS — Z6835 Body mass index (BMI) 35.0-35.9, adult: Secondary | ICD-10-CM | POA: Diagnosis not present

## 2018-12-02 DIAGNOSIS — G4730 Sleep apnea, unspecified: Secondary | ICD-10-CM | POA: Diagnosis not present

## 2018-12-02 DIAGNOSIS — Z79890 Hormone replacement therapy (postmenopausal): Secondary | ICD-10-CM | POA: Diagnosis not present

## 2018-12-14 DIAGNOSIS — R69 Illness, unspecified: Secondary | ICD-10-CM | POA: Diagnosis not present

## 2018-12-21 DIAGNOSIS — G4733 Obstructive sleep apnea (adult) (pediatric): Secondary | ICD-10-CM | POA: Diagnosis not present

## 2019-01-21 DIAGNOSIS — G4733 Obstructive sleep apnea (adult) (pediatric): Secondary | ICD-10-CM | POA: Diagnosis not present

## 2019-02-02 DIAGNOSIS — M180 Bilateral primary osteoarthritis of first carpometacarpal joints: Secondary | ICD-10-CM | POA: Diagnosis not present

## 2019-02-02 DIAGNOSIS — M5136 Other intervertebral disc degeneration, lumbar region: Secondary | ICD-10-CM | POA: Diagnosis not present

## 2019-02-02 DIAGNOSIS — M150 Primary generalized (osteo)arthritis: Secondary | ICD-10-CM | POA: Diagnosis not present

## 2019-02-20 DIAGNOSIS — G4733 Obstructive sleep apnea (adult) (pediatric): Secondary | ICD-10-CM | POA: Diagnosis not present

## 2019-03-23 DIAGNOSIS — G4733 Obstructive sleep apnea (adult) (pediatric): Secondary | ICD-10-CM | POA: Diagnosis not present

## 2019-03-29 DIAGNOSIS — Z1231 Encounter for screening mammogram for malignant neoplasm of breast: Secondary | ICD-10-CM | POA: Diagnosis not present

## 2019-04-22 DIAGNOSIS — G4733 Obstructive sleep apnea (adult) (pediatric): Secondary | ICD-10-CM | POA: Diagnosis not present

## 2019-05-04 DIAGNOSIS — M150 Primary generalized (osteo)arthritis: Secondary | ICD-10-CM | POA: Diagnosis not present

## 2019-05-04 DIAGNOSIS — M5136 Other intervertebral disc degeneration, lumbar region: Secondary | ICD-10-CM | POA: Diagnosis not present

## 2019-05-04 DIAGNOSIS — Z6830 Body mass index (BMI) 30.0-30.9, adult: Secondary | ICD-10-CM | POA: Diagnosis not present

## 2019-05-04 DIAGNOSIS — M180 Bilateral primary osteoarthritis of first carpometacarpal joints: Secondary | ICD-10-CM | POA: Diagnosis not present

## 2019-05-04 DIAGNOSIS — E669 Obesity, unspecified: Secondary | ICD-10-CM | POA: Diagnosis not present

## 2019-05-05 DIAGNOSIS — M79644 Pain in right finger(s): Secondary | ICD-10-CM | POA: Diagnosis not present

## 2019-05-05 DIAGNOSIS — G5601 Carpal tunnel syndrome, right upper limb: Secondary | ICD-10-CM | POA: Diagnosis not present

## 2019-05-05 DIAGNOSIS — M180 Bilateral primary osteoarthritis of first carpometacarpal joints: Secondary | ICD-10-CM | POA: Diagnosis not present

## 2019-05-05 DIAGNOSIS — M1811 Unilateral primary osteoarthritis of first carpometacarpal joint, right hand: Secondary | ICD-10-CM | POA: Diagnosis not present

## 2019-05-23 DIAGNOSIS — G4733 Obstructive sleep apnea (adult) (pediatric): Secondary | ICD-10-CM | POA: Diagnosis not present

## 2019-05-24 DIAGNOSIS — Z96652 Presence of left artificial knee joint: Secondary | ICD-10-CM | POA: Diagnosis not present

## 2019-05-24 DIAGNOSIS — M25562 Pain in left knee: Secondary | ICD-10-CM | POA: Diagnosis not present

## 2019-06-01 DIAGNOSIS — G4733 Obstructive sleep apnea (adult) (pediatric): Secondary | ICD-10-CM | POA: Diagnosis not present

## 2019-06-23 DIAGNOSIS — I10 Essential (primary) hypertension: Secondary | ICD-10-CM | POA: Diagnosis not present

## 2019-06-23 DIAGNOSIS — G4733 Obstructive sleep apnea (adult) (pediatric): Secondary | ICD-10-CM | POA: Diagnosis not present

## 2019-06-28 DIAGNOSIS — G4733 Obstructive sleep apnea (adult) (pediatric): Secondary | ICD-10-CM | POA: Diagnosis not present

## 2019-06-28 DIAGNOSIS — M436 Torticollis: Secondary | ICD-10-CM | POA: Diagnosis not present

## 2019-06-28 DIAGNOSIS — I10 Essential (primary) hypertension: Secondary | ICD-10-CM | POA: Diagnosis not present

## 2019-06-28 DIAGNOSIS — G4700 Insomnia, unspecified: Secondary | ICD-10-CM | POA: Diagnosis not present

## 2019-06-28 DIAGNOSIS — Z0000 Encounter for general adult medical examination without abnormal findings: Secondary | ICD-10-CM | POA: Diagnosis not present

## 2019-06-28 DIAGNOSIS — R600 Localized edema: Secondary | ICD-10-CM | POA: Diagnosis not present

## 2019-06-28 DIAGNOSIS — R69 Illness, unspecified: Secondary | ICD-10-CM | POA: Diagnosis not present

## 2019-06-28 DIAGNOSIS — R251 Tremor, unspecified: Secondary | ICD-10-CM | POA: Diagnosis not present

## 2019-06-28 DIAGNOSIS — Z800 Family history of malignant neoplasm of digestive organs: Secondary | ICD-10-CM | POA: Diagnosis not present

## 2019-07-23 DIAGNOSIS — G4733 Obstructive sleep apnea (adult) (pediatric): Secondary | ICD-10-CM | POA: Diagnosis not present

## 2019-08-04 DIAGNOSIS — M180 Bilateral primary osteoarthritis of first carpometacarpal joints: Secondary | ICD-10-CM | POA: Diagnosis not present

## 2019-08-04 DIAGNOSIS — R200 Anesthesia of skin: Secondary | ICD-10-CM | POA: Diagnosis not present

## 2019-08-05 DIAGNOSIS — Z01419 Encounter for gynecological examination (general) (routine) without abnormal findings: Secondary | ICD-10-CM | POA: Diagnosis not present

## 2019-08-05 DIAGNOSIS — R69 Illness, unspecified: Secondary | ICD-10-CM | POA: Diagnosis not present

## 2019-08-05 DIAGNOSIS — Z23 Encounter for immunization: Secondary | ICD-10-CM | POA: Diagnosis not present

## 2019-08-11 DIAGNOSIS — M180 Bilateral primary osteoarthritis of first carpometacarpal joints: Secondary | ICD-10-CM | POA: Diagnosis not present

## 2019-08-11 DIAGNOSIS — Z6831 Body mass index (BMI) 31.0-31.9, adult: Secondary | ICD-10-CM | POA: Diagnosis not present

## 2019-08-11 DIAGNOSIS — M5136 Other intervertebral disc degeneration, lumbar region: Secondary | ICD-10-CM | POA: Diagnosis not present

## 2019-08-11 DIAGNOSIS — M150 Primary generalized (osteo)arthritis: Secondary | ICD-10-CM | POA: Diagnosis not present

## 2019-08-11 DIAGNOSIS — E669 Obesity, unspecified: Secondary | ICD-10-CM | POA: Diagnosis not present

## 2019-08-19 DIAGNOSIS — H5213 Myopia, bilateral: Secondary | ICD-10-CM | POA: Diagnosis not present

## 2019-08-19 DIAGNOSIS — Z0100 Encounter for examination of eyes and vision without abnormal findings: Secondary | ICD-10-CM | POA: Diagnosis not present

## 2019-08-23 DIAGNOSIS — G4733 Obstructive sleep apnea (adult) (pediatric): Secondary | ICD-10-CM | POA: Diagnosis not present

## 2019-08-27 DIAGNOSIS — G5601 Carpal tunnel syndrome, right upper limb: Secondary | ICD-10-CM | POA: Diagnosis not present

## 2019-09-13 DIAGNOSIS — M79641 Pain in right hand: Secondary | ICD-10-CM | POA: Diagnosis not present

## 2019-09-16 DIAGNOSIS — Z1159 Encounter for screening for other viral diseases: Secondary | ICD-10-CM | POA: Diagnosis not present

## 2019-09-21 DIAGNOSIS — Z800 Family history of malignant neoplasm of digestive organs: Secondary | ICD-10-CM | POA: Diagnosis not present

## 2019-09-22 ENCOUNTER — Other Ambulatory Visit: Payer: Self-pay | Admitting: Gastroenterology

## 2019-09-22 DIAGNOSIS — Z800 Family history of malignant neoplasm of digestive organs: Secondary | ICD-10-CM

## 2019-09-22 DIAGNOSIS — G4733 Obstructive sleep apnea (adult) (pediatric): Secondary | ICD-10-CM | POA: Diagnosis not present

## 2019-09-23 DIAGNOSIS — H4921 Sixth [abducent] nerve palsy, right eye: Secondary | ICD-10-CM | POA: Diagnosis not present

## 2019-10-23 DIAGNOSIS — G4733 Obstructive sleep apnea (adult) (pediatric): Secondary | ICD-10-CM | POA: Diagnosis not present

## 2019-11-05 ENCOUNTER — Inpatient Hospital Stay: Admission: RE | Admit: 2019-11-05 | Payer: PPO | Source: Ambulatory Visit

## 2019-11-11 DIAGNOSIS — M150 Primary generalized (osteo)arthritis: Secondary | ICD-10-CM | POA: Diagnosis not present

## 2019-11-11 DIAGNOSIS — M180 Bilateral primary osteoarthritis of first carpometacarpal joints: Secondary | ICD-10-CM | POA: Diagnosis not present

## 2019-11-11 DIAGNOSIS — M5136 Other intervertebral disc degeneration, lumbar region: Secondary | ICD-10-CM | POA: Diagnosis not present

## 2019-11-16 ENCOUNTER — Ambulatory Visit
Admission: RE | Admit: 2019-11-16 | Discharge: 2019-11-16 | Disposition: A | Discharge status: Home / Self Care | Payer: Medicare HMO | Source: Ambulatory Visit | Attending: Gastroenterology | Admitting: Gastroenterology

## 2019-11-16 DIAGNOSIS — Z1211 Encounter for screening for malignant neoplasm of colon: Secondary | ICD-10-CM | POA: Diagnosis not present

## 2019-11-16 DIAGNOSIS — Z800 Family history of malignant neoplasm of digestive organs: Secondary | ICD-10-CM

## 2020-01-05 DIAGNOSIS — R69 Illness, unspecified: Secondary | ICD-10-CM | POA: Diagnosis not present

## 2020-01-20 DIAGNOSIS — Z85828 Personal history of other malignant neoplasm of skin: Secondary | ICD-10-CM | POA: Diagnosis not present

## 2020-01-20 DIAGNOSIS — D2261 Melanocytic nevi of right upper limb, including shoulder: Secondary | ICD-10-CM | POA: Diagnosis not present

## 2020-01-20 DIAGNOSIS — L723 Sebaceous cyst: Secondary | ICD-10-CM | POA: Diagnosis not present

## 2020-01-20 DIAGNOSIS — D485 Neoplasm of uncertain behavior of skin: Secondary | ICD-10-CM | POA: Diagnosis not present

## 2020-01-20 DIAGNOSIS — L578 Other skin changes due to chronic exposure to nonionizing radiation: Secondary | ICD-10-CM | POA: Diagnosis not present

## 2020-01-20 DIAGNOSIS — L821 Other seborrheic keratosis: Secondary | ICD-10-CM | POA: Diagnosis not present

## 2020-01-20 DIAGNOSIS — L738 Other specified follicular disorders: Secondary | ICD-10-CM | POA: Diagnosis not present

## 2020-01-20 DIAGNOSIS — R202 Paresthesia of skin: Secondary | ICD-10-CM | POA: Diagnosis not present

## 2020-01-20 DIAGNOSIS — D225 Melanocytic nevi of trunk: Secondary | ICD-10-CM | POA: Diagnosis not present

## 2020-01-20 DIAGNOSIS — C44519 Basal cell carcinoma of skin of other part of trunk: Secondary | ICD-10-CM | POA: Diagnosis not present

## 2020-01-27 DIAGNOSIS — H5021 Vertical strabismus, right eye: Secondary | ICD-10-CM | POA: Diagnosis not present

## 2020-02-10 DIAGNOSIS — M150 Primary generalized (osteo)arthritis: Secondary | ICD-10-CM | POA: Diagnosis not present

## 2020-02-10 DIAGNOSIS — Z6831 Body mass index (BMI) 31.0-31.9, adult: Secondary | ICD-10-CM | POA: Diagnosis not present

## 2020-02-10 DIAGNOSIS — M180 Bilateral primary osteoarthritis of first carpometacarpal joints: Secondary | ICD-10-CM | POA: Diagnosis not present

## 2020-02-10 DIAGNOSIS — M5136 Other intervertebral disc degeneration, lumbar region: Secondary | ICD-10-CM | POA: Diagnosis not present

## 2020-02-10 DIAGNOSIS — E669 Obesity, unspecified: Secondary | ICD-10-CM | POA: Diagnosis not present

## 2020-02-11 DIAGNOSIS — C44519 Basal cell carcinoma of skin of other part of trunk: Secondary | ICD-10-CM | POA: Diagnosis not present

## 2020-03-21 DIAGNOSIS — G8929 Other chronic pain: Secondary | ICD-10-CM | POA: Diagnosis not present

## 2020-03-21 DIAGNOSIS — G4700 Insomnia, unspecified: Secondary | ICD-10-CM | POA: Diagnosis not present

## 2020-03-21 DIAGNOSIS — Z008 Encounter for other general examination: Secondary | ICD-10-CM | POA: Diagnosis not present

## 2020-03-21 DIAGNOSIS — I10 Essential (primary) hypertension: Secondary | ICD-10-CM | POA: Diagnosis not present

## 2020-03-21 DIAGNOSIS — Z6832 Body mass index (BMI) 32.0-32.9, adult: Secondary | ICD-10-CM | POA: Diagnosis not present

## 2020-03-21 DIAGNOSIS — R32 Unspecified urinary incontinence: Secondary | ICD-10-CM | POA: Diagnosis not present

## 2020-03-21 DIAGNOSIS — R69 Illness, unspecified: Secondary | ICD-10-CM | POA: Diagnosis not present

## 2020-03-21 DIAGNOSIS — M1990 Unspecified osteoarthritis, unspecified site: Secondary | ICD-10-CM | POA: Diagnosis not present

## 2020-03-21 DIAGNOSIS — Z79891 Long term (current) use of opiate analgesic: Secondary | ICD-10-CM | POA: Diagnosis not present

## 2020-03-21 DIAGNOSIS — Z791 Long term (current) use of non-steroidal anti-inflammatories (NSAID): Secondary | ICD-10-CM | POA: Diagnosis not present

## 2020-03-21 DIAGNOSIS — E669 Obesity, unspecified: Secondary | ICD-10-CM | POA: Diagnosis not present

## 2020-04-03 DIAGNOSIS — Z1231 Encounter for screening mammogram for malignant neoplasm of breast: Secondary | ICD-10-CM | POA: Diagnosis not present

## 2020-04-18 DIAGNOSIS — R928 Other abnormal and inconclusive findings on diagnostic imaging of breast: Secondary | ICD-10-CM | POA: Diagnosis not present

## 2020-05-11 DIAGNOSIS — E669 Obesity, unspecified: Secondary | ICD-10-CM | POA: Diagnosis not present

## 2020-05-11 DIAGNOSIS — Z6831 Body mass index (BMI) 31.0-31.9, adult: Secondary | ICD-10-CM | POA: Diagnosis not present

## 2020-05-11 DIAGNOSIS — M5136 Other intervertebral disc degeneration, lumbar region: Secondary | ICD-10-CM | POA: Diagnosis not present

## 2020-05-11 DIAGNOSIS — M180 Bilateral primary osteoarthritis of first carpometacarpal joints: Secondary | ICD-10-CM | POA: Diagnosis not present

## 2020-05-11 DIAGNOSIS — M150 Primary generalized (osteo)arthritis: Secondary | ICD-10-CM | POA: Diagnosis not present

## 2020-06-29 DIAGNOSIS — I10 Essential (primary) hypertension: Secondary | ICD-10-CM | POA: Diagnosis not present

## 2020-06-29 DIAGNOSIS — E559 Vitamin D deficiency, unspecified: Secondary | ICD-10-CM | POA: Diagnosis not present

## 2020-06-29 DIAGNOSIS — N390 Urinary tract infection, site not specified: Secondary | ICD-10-CM | POA: Diagnosis not present

## 2020-07-04 DIAGNOSIS — Z6831 Body mass index (BMI) 31.0-31.9, adult: Secondary | ICD-10-CM | POA: Diagnosis not present

## 2020-07-04 DIAGNOSIS — R3915 Urgency of urination: Secondary | ICD-10-CM | POA: Diagnosis not present

## 2020-07-04 DIAGNOSIS — R079 Chest pain, unspecified: Secondary | ICD-10-CM | POA: Diagnosis not present

## 2020-07-04 DIAGNOSIS — M542 Cervicalgia: Secondary | ICD-10-CM | POA: Diagnosis not present

## 2020-07-04 DIAGNOSIS — G4733 Obstructive sleep apnea (adult) (pediatric): Secondary | ICD-10-CM | POA: Diagnosis not present

## 2020-07-04 DIAGNOSIS — I10 Essential (primary) hypertension: Secondary | ICD-10-CM | POA: Diagnosis not present

## 2020-07-04 DIAGNOSIS — R69 Illness, unspecified: Secondary | ICD-10-CM | POA: Diagnosis not present

## 2020-07-04 DIAGNOSIS — Z0000 Encounter for general adult medical examination without abnormal findings: Secondary | ICD-10-CM | POA: Diagnosis not present

## 2020-07-04 DIAGNOSIS — G4700 Insomnia, unspecified: Secondary | ICD-10-CM | POA: Diagnosis not present

## 2020-07-04 DIAGNOSIS — R0789 Other chest pain: Secondary | ICD-10-CM | POA: Diagnosis not present

## 2020-07-10 DIAGNOSIS — R300 Dysuria: Secondary | ICD-10-CM | POA: Diagnosis not present

## 2020-07-10 DIAGNOSIS — N3001 Acute cystitis with hematuria: Secondary | ICD-10-CM | POA: Diagnosis not present

## 2020-07-24 DIAGNOSIS — Z6830 Body mass index (BMI) 30.0-30.9, adult: Secondary | ICD-10-CM | POA: Diagnosis not present

## 2020-07-24 DIAGNOSIS — M180 Bilateral primary osteoarthritis of first carpometacarpal joints: Secondary | ICD-10-CM | POA: Diagnosis not present

## 2020-07-24 DIAGNOSIS — M150 Primary generalized (osteo)arthritis: Secondary | ICD-10-CM | POA: Diagnosis not present

## 2020-07-24 DIAGNOSIS — E669 Obesity, unspecified: Secondary | ICD-10-CM | POA: Diagnosis not present

## 2020-07-24 DIAGNOSIS — M5136 Other intervertebral disc degeneration, lumbar region: Secondary | ICD-10-CM | POA: Diagnosis not present

## 2020-08-08 DIAGNOSIS — R69 Illness, unspecified: Secondary | ICD-10-CM | POA: Diagnosis not present

## 2020-08-08 DIAGNOSIS — Z01419 Encounter for gynecological examination (general) (routine) without abnormal findings: Secondary | ICD-10-CM | POA: Diagnosis not present

## 2020-08-15 DIAGNOSIS — L820 Inflamed seborrheic keratosis: Secondary | ICD-10-CM | POA: Diagnosis not present

## 2020-08-15 DIAGNOSIS — L910 Hypertrophic scar: Secondary | ICD-10-CM | POA: Diagnosis not present

## 2020-09-12 ENCOUNTER — Institutional Professional Consult (permissible substitution): Payer: Medicare HMO | Admitting: Neurology

## 2020-10-12 ENCOUNTER — Encounter: Payer: Self-pay | Admitting: Neurology

## 2020-10-12 ENCOUNTER — Other Ambulatory Visit: Payer: Self-pay | Admitting: Neurology

## 2020-10-16 ENCOUNTER — Institutional Professional Consult (permissible substitution): Payer: Medicare HMO | Admitting: Neurology

## 2020-10-24 DIAGNOSIS — Z6830 Body mass index (BMI) 30.0-30.9, adult: Secondary | ICD-10-CM | POA: Diagnosis not present

## 2020-10-24 DIAGNOSIS — M150 Primary generalized (osteo)arthritis: Secondary | ICD-10-CM | POA: Diagnosis not present

## 2020-10-24 DIAGNOSIS — M180 Bilateral primary osteoarthritis of first carpometacarpal joints: Secondary | ICD-10-CM | POA: Diagnosis not present

## 2020-10-24 DIAGNOSIS — H5213 Myopia, bilateral: Secondary | ICD-10-CM | POA: Diagnosis not present

## 2020-10-24 DIAGNOSIS — M5136 Other intervertebral disc degeneration, lumbar region: Secondary | ICD-10-CM | POA: Diagnosis not present

## 2020-10-24 DIAGNOSIS — E669 Obesity, unspecified: Secondary | ICD-10-CM | POA: Diagnosis not present

## 2020-11-11 DIAGNOSIS — R69 Illness, unspecified: Secondary | ICD-10-CM | POA: Diagnosis not present

## 2020-11-11 DIAGNOSIS — M159 Polyosteoarthritis, unspecified: Secondary | ICD-10-CM | POA: Diagnosis not present

## 2020-11-11 DIAGNOSIS — I10 Essential (primary) hypertension: Secondary | ICD-10-CM | POA: Diagnosis not present

## 2020-12-05 DIAGNOSIS — R238 Other skin changes: Secondary | ICD-10-CM | POA: Diagnosis not present

## 2020-12-06 DIAGNOSIS — L980 Pyogenic granuloma: Secondary | ICD-10-CM | POA: Diagnosis not present

## 2020-12-11 DIAGNOSIS — R69 Illness, unspecified: Secondary | ICD-10-CM | POA: Diagnosis not present

## 2020-12-11 DIAGNOSIS — I10 Essential (primary) hypertension: Secondary | ICD-10-CM | POA: Diagnosis not present

## 2020-12-11 DIAGNOSIS — M159 Polyosteoarthritis, unspecified: Secondary | ICD-10-CM | POA: Diagnosis not present

## 2020-12-11 DIAGNOSIS — M5030 Other cervical disc degeneration, unspecified cervical region: Secondary | ICD-10-CM | POA: Diagnosis not present

## 2021-01-09 DIAGNOSIS — G4733 Obstructive sleep apnea (adult) (pediatric): Secondary | ICD-10-CM | POA: Diagnosis not present

## 2021-01-22 DIAGNOSIS — Z6831 Body mass index (BMI) 31.0-31.9, adult: Secondary | ICD-10-CM | POA: Diagnosis not present

## 2021-01-22 DIAGNOSIS — M180 Bilateral primary osteoarthritis of first carpometacarpal joints: Secondary | ICD-10-CM | POA: Diagnosis not present

## 2021-01-22 DIAGNOSIS — M5136 Other intervertebral disc degeneration, lumbar region: Secondary | ICD-10-CM | POA: Diagnosis not present

## 2021-01-22 DIAGNOSIS — M150 Primary generalized (osteo)arthritis: Secondary | ICD-10-CM | POA: Diagnosis not present

## 2021-01-22 DIAGNOSIS — E669 Obesity, unspecified: Secondary | ICD-10-CM | POA: Diagnosis not present

## 2021-01-23 DIAGNOSIS — L723 Sebaceous cyst: Secondary | ICD-10-CM | POA: Diagnosis not present

## 2021-01-23 DIAGNOSIS — D225 Melanocytic nevi of trunk: Secondary | ICD-10-CM | POA: Diagnosis not present

## 2021-01-23 DIAGNOSIS — L738 Other specified follicular disorders: Secondary | ICD-10-CM | POA: Diagnosis not present

## 2021-01-23 DIAGNOSIS — L578 Other skin changes due to chronic exposure to nonionizing radiation: Secondary | ICD-10-CM | POA: Diagnosis not present

## 2021-01-23 DIAGNOSIS — Z85828 Personal history of other malignant neoplasm of skin: Secondary | ICD-10-CM | POA: Diagnosis not present

## 2021-01-23 DIAGNOSIS — D2261 Melanocytic nevi of right upper limb, including shoulder: Secondary | ICD-10-CM | POA: Diagnosis not present

## 2021-01-23 DIAGNOSIS — L821 Other seborrheic keratosis: Secondary | ICD-10-CM | POA: Diagnosis not present

## 2021-03-13 DIAGNOSIS — I10 Essential (primary) hypertension: Secondary | ICD-10-CM | POA: Diagnosis not present

## 2021-03-13 DIAGNOSIS — M5030 Other cervical disc degeneration, unspecified cervical region: Secondary | ICD-10-CM | POA: Diagnosis not present

## 2021-03-13 DIAGNOSIS — R69 Illness, unspecified: Secondary | ICD-10-CM | POA: Diagnosis not present

## 2021-03-13 DIAGNOSIS — M159 Polyosteoarthritis, unspecified: Secondary | ICD-10-CM | POA: Diagnosis not present

## 2021-03-22 DIAGNOSIS — M25562 Pain in left knee: Secondary | ICD-10-CM | POA: Diagnosis not present

## 2021-03-22 DIAGNOSIS — Z96652 Presence of left artificial knee joint: Secondary | ICD-10-CM | POA: Diagnosis not present

## 2021-04-13 DIAGNOSIS — R5383 Other fatigue: Secondary | ICD-10-CM | POA: Diagnosis not present

## 2021-04-13 DIAGNOSIS — W57XXXA Bitten or stung by nonvenomous insect and other nonvenomous arthropods, initial encounter: Secondary | ICD-10-CM | POA: Diagnosis not present

## 2021-04-13 DIAGNOSIS — S40261A Insect bite (nonvenomous) of right shoulder, initial encounter: Secondary | ICD-10-CM | POA: Diagnosis not present

## 2021-04-23 DIAGNOSIS — Z1231 Encounter for screening mammogram for malignant neoplasm of breast: Secondary | ICD-10-CM | POA: Diagnosis not present

## 2021-04-24 DIAGNOSIS — R748 Abnormal levels of other serum enzymes: Secondary | ICD-10-CM | POA: Diagnosis not present

## 2021-05-07 ENCOUNTER — Other Ambulatory Visit: Payer: Self-pay | Admitting: Internal Medicine

## 2021-05-07 DIAGNOSIS — R748 Abnormal levels of other serum enzymes: Secondary | ICD-10-CM

## 2021-05-11 DIAGNOSIS — M180 Bilateral primary osteoarthritis of first carpometacarpal joints: Secondary | ICD-10-CM | POA: Diagnosis not present

## 2021-05-11 DIAGNOSIS — M150 Primary generalized (osteo)arthritis: Secondary | ICD-10-CM | POA: Diagnosis not present

## 2021-05-11 DIAGNOSIS — M5136 Other intervertebral disc degeneration, lumbar region: Secondary | ICD-10-CM | POA: Diagnosis not present

## 2021-05-11 DIAGNOSIS — Z6831 Body mass index (BMI) 31.0-31.9, adult: Secondary | ICD-10-CM | POA: Diagnosis not present

## 2021-05-11 DIAGNOSIS — E669 Obesity, unspecified: Secondary | ICD-10-CM | POA: Diagnosis not present

## 2021-05-21 ENCOUNTER — Ambulatory Visit
Admission: RE | Admit: 2021-05-21 | Discharge: 2021-05-21 | Disposition: A | Discharge status: Home / Self Care | Payer: Medicare HMO | Source: Ambulatory Visit | Attending: Internal Medicine | Admitting: Internal Medicine

## 2021-05-21 DIAGNOSIS — K760 Fatty (change of) liver, not elsewhere classified: Secondary | ICD-10-CM | POA: Diagnosis not present

## 2021-05-21 DIAGNOSIS — R748 Abnormal levels of other serum enzymes: Secondary | ICD-10-CM

## 2021-06-04 DIAGNOSIS — R748 Abnormal levels of other serum enzymes: Secondary | ICD-10-CM | POA: Diagnosis not present

## 2021-06-13 DIAGNOSIS — R69 Illness, unspecified: Secondary | ICD-10-CM | POA: Diagnosis not present

## 2021-06-13 DIAGNOSIS — I10 Essential (primary) hypertension: Secondary | ICD-10-CM | POA: Diagnosis not present

## 2021-06-13 DIAGNOSIS — M5030 Other cervical disc degeneration, unspecified cervical region: Secondary | ICD-10-CM | POA: Diagnosis not present

## 2021-06-13 DIAGNOSIS — M159 Polyosteoarthritis, unspecified: Secondary | ICD-10-CM | POA: Diagnosis not present

## 2021-07-10 DIAGNOSIS — R748 Abnormal levels of other serum enzymes: Secondary | ICD-10-CM | POA: Diagnosis not present

## 2021-07-10 DIAGNOSIS — K760 Fatty (change of) liver, not elsewhere classified: Secondary | ICD-10-CM | POA: Diagnosis not present

## 2021-07-10 DIAGNOSIS — G4700 Insomnia, unspecified: Secondary | ICD-10-CM | POA: Diagnosis not present

## 2021-07-10 DIAGNOSIS — I10 Essential (primary) hypertension: Secondary | ICD-10-CM | POA: Diagnosis not present

## 2021-07-10 DIAGNOSIS — Z0000 Encounter for general adult medical examination without abnormal findings: Secondary | ICD-10-CM | POA: Diagnosis not present

## 2021-07-10 DIAGNOSIS — M159 Polyosteoarthritis, unspecified: Secondary | ICD-10-CM | POA: Diagnosis not present

## 2021-07-10 DIAGNOSIS — G4733 Obstructive sleep apnea (adult) (pediatric): Secondary | ICD-10-CM | POA: Diagnosis not present

## 2021-07-11 ENCOUNTER — Other Ambulatory Visit: Payer: Self-pay | Admitting: Internal Medicine

## 2021-07-11 DIAGNOSIS — I10 Essential (primary) hypertension: Secondary | ICD-10-CM

## 2021-07-13 DIAGNOSIS — I10 Essential (primary) hypertension: Secondary | ICD-10-CM | POA: Diagnosis not present

## 2021-07-13 DIAGNOSIS — R69 Illness, unspecified: Secondary | ICD-10-CM | POA: Diagnosis not present

## 2021-07-13 DIAGNOSIS — M159 Polyosteoarthritis, unspecified: Secondary | ICD-10-CM | POA: Diagnosis not present

## 2021-07-13 DIAGNOSIS — M5030 Other cervical disc degeneration, unspecified cervical region: Secondary | ICD-10-CM | POA: Diagnosis not present

## 2021-08-02 ENCOUNTER — Ambulatory Visit
Admission: RE | Admit: 2021-08-02 | Discharge: 2021-08-02 | Disposition: A | Discharge status: Home / Self Care | Payer: Medicare HMO | Source: Ambulatory Visit | Attending: Internal Medicine | Admitting: Internal Medicine

## 2021-08-02 DIAGNOSIS — I10 Essential (primary) hypertension: Secondary | ICD-10-CM

## 2021-08-07 DIAGNOSIS — S52522A Torus fracture of lower end of left radius, initial encounter for closed fracture: Secondary | ICD-10-CM | POA: Diagnosis not present

## 2021-08-07 DIAGNOSIS — M25532 Pain in left wrist: Secondary | ICD-10-CM | POA: Diagnosis not present

## 2021-08-10 DIAGNOSIS — Z01419 Encounter for gynecological examination (general) (routine) without abnormal findings: Secondary | ICD-10-CM | POA: Diagnosis not present

## 2021-08-10 DIAGNOSIS — I10 Essential (primary) hypertension: Secondary | ICD-10-CM | POA: Diagnosis not present

## 2021-08-13 DIAGNOSIS — M25532 Pain in left wrist: Secondary | ICD-10-CM | POA: Diagnosis not present

## 2021-08-13 DIAGNOSIS — M159 Polyosteoarthritis, unspecified: Secondary | ICD-10-CM | POA: Diagnosis not present

## 2021-08-13 DIAGNOSIS — I10 Essential (primary) hypertension: Secondary | ICD-10-CM | POA: Diagnosis not present

## 2021-08-13 DIAGNOSIS — S62112A Displaced fracture of triquetrum [cuneiform] bone, left wrist, initial encounter for closed fracture: Secondary | ICD-10-CM | POA: Diagnosis not present

## 2021-08-13 DIAGNOSIS — M5030 Other cervical disc degeneration, unspecified cervical region: Secondary | ICD-10-CM | POA: Diagnosis not present

## 2021-08-13 DIAGNOSIS — S60212A Contusion of left wrist, initial encounter: Secondary | ICD-10-CM | POA: Diagnosis not present

## 2021-08-13 DIAGNOSIS — R69 Illness, unspecified: Secondary | ICD-10-CM | POA: Diagnosis not present

## 2021-08-13 DIAGNOSIS — M13842 Other specified arthritis, left hand: Secondary | ICD-10-CM | POA: Diagnosis not present

## 2021-08-14 DIAGNOSIS — M180 Bilateral primary osteoarthritis of first carpometacarpal joints: Secondary | ICD-10-CM | POA: Diagnosis not present

## 2021-08-14 DIAGNOSIS — M150 Primary generalized (osteo)arthritis: Secondary | ICD-10-CM | POA: Diagnosis not present

## 2021-08-14 DIAGNOSIS — E663 Overweight: Secondary | ICD-10-CM | POA: Diagnosis not present

## 2021-08-14 DIAGNOSIS — Z6829 Body mass index (BMI) 29.0-29.9, adult: Secondary | ICD-10-CM | POA: Diagnosis not present

## 2021-08-14 DIAGNOSIS — M5136 Other intervertebral disc degeneration, lumbar region: Secondary | ICD-10-CM | POA: Diagnosis not present

## 2021-09-12 DIAGNOSIS — R9389 Abnormal findings on diagnostic imaging of other specified body structures: Secondary | ICD-10-CM | POA: Diagnosis not present

## 2021-09-12 DIAGNOSIS — M5030 Other cervical disc degeneration, unspecified cervical region: Secondary | ICD-10-CM | POA: Diagnosis not present

## 2021-09-12 DIAGNOSIS — I10 Essential (primary) hypertension: Secondary | ICD-10-CM | POA: Diagnosis not present

## 2021-09-12 DIAGNOSIS — R69 Illness, unspecified: Secondary | ICD-10-CM | POA: Diagnosis not present

## 2021-09-12 DIAGNOSIS — K449 Diaphragmatic hernia without obstruction or gangrene: Secondary | ICD-10-CM | POA: Diagnosis not present

## 2021-09-12 DIAGNOSIS — M159 Polyosteoarthritis, unspecified: Secondary | ICD-10-CM | POA: Diagnosis not present

## 2021-09-13 ENCOUNTER — Other Ambulatory Visit: Payer: Self-pay | Admitting: Internal Medicine

## 2021-09-13 DIAGNOSIS — R9389 Abnormal findings on diagnostic imaging of other specified body structures: Secondary | ICD-10-CM

## 2021-10-09 ENCOUNTER — Other Ambulatory Visit: Payer: No Typology Code available for payment source

## 2021-10-09 ENCOUNTER — Ambulatory Visit
Admission: RE | Admit: 2021-10-09 | Discharge: 2021-10-09 | Disposition: A | Discharge status: Home / Self Care | Payer: No Typology Code available for payment source | Source: Ambulatory Visit | Attending: Internal Medicine | Admitting: Internal Medicine

## 2021-10-09 DIAGNOSIS — R911 Solitary pulmonary nodule: Secondary | ICD-10-CM | POA: Diagnosis not present

## 2021-10-09 DIAGNOSIS — R9389 Abnormal findings on diagnostic imaging of other specified body structures: Secondary | ICD-10-CM

## 2021-10-09 DIAGNOSIS — J479 Bronchiectasis, uncomplicated: Secondary | ICD-10-CM | POA: Diagnosis not present

## 2021-10-12 ENCOUNTER — Other Ambulatory Visit: Payer: Self-pay | Admitting: Internal Medicine

## 2021-10-12 DIAGNOSIS — M159 Polyosteoarthritis, unspecified: Secondary | ICD-10-CM | POA: Diagnosis not present

## 2021-10-12 DIAGNOSIS — I10 Essential (primary) hypertension: Secondary | ICD-10-CM | POA: Diagnosis not present

## 2021-10-12 DIAGNOSIS — E041 Nontoxic single thyroid nodule: Secondary | ICD-10-CM

## 2021-10-12 DIAGNOSIS — R69 Illness, unspecified: Secondary | ICD-10-CM | POA: Diagnosis not present

## 2021-10-12 DIAGNOSIS — M5030 Other cervical disc degeneration, unspecified cervical region: Secondary | ICD-10-CM | POA: Diagnosis not present

## 2021-10-19 ENCOUNTER — Encounter: Payer: Self-pay | Admitting: Emergency Medicine

## 2021-10-19 ENCOUNTER — Other Ambulatory Visit: Payer: Self-pay

## 2021-10-19 ENCOUNTER — Ambulatory Visit: Payer: Medicare HMO | Admitting: Emergency Medicine

## 2021-10-19 DIAGNOSIS — R9389 Abnormal findings on diagnostic imaging of other specified body structures: Secondary | ICD-10-CM

## 2021-10-19 NOTE — Progress Notes (Signed)
Subjective:    Patient ID: [name], female    DOB: 03/22/1951, 71 y.o.   MRN: [id number]  HPI 71 year old woman, former smoker (0.5 pack year) with a history of hypertension, OSA on CPAP, DJD.  She is referred today for evaluation of an abnormal CT scan of the chest. She is followed by Dr. Shelia Media and underwent a coronary calcium scoring CT, dedicated CT chest as below.  Compliant w CPAP, uses reliably. Doesn't always clean it.  No hx of any auto-immune disease She had PNA over 40 yrs ago, never since. Has had some bronchitis in setting URI before.  No dyspnea. Has some occasional UA cough but in general does not cough freq.   Father > lung CA Mother > colon CA  Cardiac calcium scoring CT 08/02/2021 reviewed by me shows a calcium score of 0, some subtle hazy patchy left upper and lower lobe groundglass opacity of unclear significance.  A small hiatal hernia  CT scan of the chest 10/09/2021 reviewed by me, shows some groundglass airspace opacity left upper lobe and a peribronchovascular distribution, additional left upper lobe and left lower lobe, right middle lobe groundglass opacity as well.  No change compared with 08/02/2021   Review of Systems As per HPI  Past Medical History:  Diagnosis Date   Anxiety    Arthritis KNEES AND HANDS   Bladder tumor    benign   Blood transfusion    DDD (degenerative disc disease)    DDD (degenerative disc disease), lumbar    Hypertension    Normal cardiac stress test 08-10-2004   OSA on CPAP    uses cpap, pt does not know settings   Urgency of urination      Family History  Problem Relation Age of Onset   Cancer Mother        colon   Hypertension Mother    Heart murmur Mother    Cancer Father        lung   Hypertension Father    Diabetes Father    Cancer Brother        rectal     Social History   Socioeconomic History   Marital status: Married    Spouse name: Clare Gandy   Number of children: 2   Years of education: 53    Highest education level: Not on file  Occupational History    Comment: [employer]  Tobacco Use   Smoking status: Former    Packs/day: 0.25    Years: 50.00    Pack years: 12.50    Types: Cigarettes    Quit date: 06/25/1980    Years since quitting: 41.3   Smokeless tobacco: Never  Substance and Sexual Activity   Alcohol use: Yes    Alcohol/week: 1.0 standard drink    Types: 1 Glasses of wine per week    Comment: occasional   Drug use: No   Sexual activity: Yes    Birth control/protection: Post-menopausal  Other Topics Concern   Not on file  Social History Narrative   Lives at home with spouse   Caffeine - coffee, 1 cup daily   Social Determinants of Health   Financial Resource Strain: Not on file  Food Insecurity: Not on file  Transportation Needs: Not on file  Physical Activity: Not on file  Stress: Not on file  Social Connections: Not on file  Intimate Partner Violence: Not on file     Allergies  Allergen Reactions   Vancomycin Rash  Outpatient Medications Prior to Visit  Medication Sig Dispense Refill   amLODipine (NORVASC) 10 MG tablet Take 1 tablet by mouth daily.     amLODIPine-Valsartan-HCTZ 10-320-25 MG TABS Take 1 tablet by mouth every morning.     celecoxib (CELEBREX) 200 MG capsule Take 1 capsule (200 mg total) by mouth every 12 (twelve) hours. 60 capsule 0   cyclobenzaprine (FLEXERIL) 10 MG tablet Take 1 tablet by mouth every 8 (eight) hours as needed.     diclofenac Sodium (VOLTAREN) 1 % GEL Apply 4 g topically 3 (three) times daily as needed.     HYDROcodone-acetaminophen (NORCO/VICODIN) 5-325 MG tablet Take by mouth as needed.     Multiple Vitamin (MULTIVITAMIN WITH MINERALS) TABS tablet Take 1 tablet by mouth every morning.     zolpidem (AMBIEN CR) 12.5 MG CR tablet Take 12.5 mg by mouth at bedtime as needed for sleep.      ALPRAZolam (XANAX) 0.25 MG tablet Take 0.25 mg by mouth 2 (two) times daily as needed. (Patient not taking: Reported on 10/19/2021)      cholecalciferol (VITAMIN D) 1000 units tablet Take 1,000 Units by mouth every morning. (Patient not taking: Reported on 10/19/2021)     estrogens, conjugated, (PREMARIN) 0.45 MG tablet Take 0.45 mg by mouth every morning. Reported on 01/01/2016 (Patient not taking: Reported on 10/19/2021)     sertraline (ZOLOFT) 50 MG tablet Take 50 mg by mouth every morning. (Patient not taking: Reported on 10/19/2021)     valsartan-hydrochlorothiazide (DIOVAN-HCT) 160-12.5 MG tablet Take 1 tablet by mouth daily. (Patient not taking: Reported on 10/19/2021)     venlafaxine XR (EFFEXOR-XR) 75 MG 24 hr capsule venlafaxine ER 75 mg capsule,extended release 24 hr  TAKE 1 CAPSULE BY MOUTH EVERY DAY WITH FOOD (Patient not taking: Reported on 10/19/2021)     No facility-administered medications prior to visit.         Objective:   Physical Exam Vitals:   10/19/21 0931  BP: 114/68  Pulse: 92  Temp: 98.7 F (37.1 C)  TempSrc: Oral  SpO2: 98%  Weight: 172 lb 12.8 oz (78.4 kg)  Height: 5\' 4"  (1.626 m)   Gen: Pleasant, well-nourished, in no distress,  normal affect  ENT: No lesions,  mouth clear,  oropharynx clear, no postnasal drip  Neck: No JVD, no stridor  Lungs: No use of accessory muscles, no crackles or wheezing on normal respiration, no wheeze on forced expiration  Cardiovascular: RRR, heart sounds normal, no murmur or gallops, no peripheral edema  Musculoskeletal: No deformities, no cyanosis or clubbing  Neuro: alert, awake, non focal  Skin: Warm, no lesions or rash      Assessment & Plan:   Abnormal CT of the chest Bilateral, left predominant patchy groundglass change of unclear significance.  Does not appear to be predominantly nodular although there are some more notable foci in the left upper lobe.  She denies any autoimmune disease, infectious symptoms, constitutional symptoms.  Never smoker.  She does not have any dyspnea or history of asthma to support air trapping.  I would like to repeat  her CT chest at the 1 year mark to follow for interval change, interval stability.  If she develops constitutional symptoms, respiratory symptoms that we need to correlate then we would repeat her scan sooner.  If there is evolving change radiographically then we could consider bronchoscopy for BAL and possible biopsies.   Baltazar Apo, MD, PhD 10/19/2021, 10:10 AM [facility] [phone] or  if no answer before 7:00PM call [phone] For any issues after 7:00PM please call eLink [phone]

## 2021-10-19 NOTE — Addendum Note (Signed)
Addended by: Gavin Potters R on: 10/19/2021 10:44 AM   Modules accepted: Orders

## 2021-10-19 NOTE — Assessment & Plan Note (Signed)
Bilateral, left predominant patchy groundglass change of unclear significance.  Does not appear to be predominantly nodular although there are some more notable foci in the left upper lobe.  She denies any autoimmune disease, infectious symptoms, constitutional symptoms.  Never smoker.  She does not have any dyspnea or history of asthma to support air trapping.  I would like to repeat her CT chest at the 1 year mark to follow for interval change, interval stability.  If she develops constitutional symptoms, respiratory symptoms that we need to correlate then we would repeat her scan sooner.  If there is evolving change radiographically then we could consider bronchoscopy for BAL and possible biopsies.

## 2021-10-19 NOTE — Patient Instructions (Signed)
We will plan to repeat your CT scan of the chest in December 2023 to compare with priors. Please notify our office if you develop any new symptoms including shortness of breath, increased cough, increased mucus production, fevers or weight loss. Follow with Dr Lamonte Sakai in December after your CT so we can review the results together.

## 2021-10-24 ENCOUNTER — Ambulatory Visit
Admission: RE | Admit: 2021-10-24 | Discharge: 2021-10-24 | Disposition: A | Discharge status: Home / Self Care | Payer: Medicare HMO | Source: Ambulatory Visit | Attending: Internal Medicine | Admitting: Internal Medicine

## 2021-10-24 DIAGNOSIS — E041 Nontoxic single thyroid nodule: Secondary | ICD-10-CM

## 2021-10-24 DIAGNOSIS — E042 Nontoxic multinodular goiter: Secondary | ICD-10-CM | POA: Diagnosis not present

## 2021-11-02 DIAGNOSIS — Z20822 Contact with and (suspected) exposure to covid-19: Secondary | ICD-10-CM | POA: Diagnosis not present

## 2021-11-03 DIAGNOSIS — Z20822 Contact with and (suspected) exposure to covid-19: Secondary | ICD-10-CM | POA: Diagnosis not present

## 2021-11-04 DIAGNOSIS — Z20822 Contact with and (suspected) exposure to covid-19: Secondary | ICD-10-CM | POA: Diagnosis not present

## 2021-11-13 DIAGNOSIS — R69 Illness, unspecified: Secondary | ICD-10-CM | POA: Diagnosis not present

## 2021-11-13 DIAGNOSIS — M159 Polyosteoarthritis, unspecified: Secondary | ICD-10-CM | POA: Diagnosis not present

## 2021-11-13 DIAGNOSIS — I10 Essential (primary) hypertension: Secondary | ICD-10-CM | POA: Diagnosis not present

## 2021-11-13 DIAGNOSIS — M5030 Other cervical disc degeneration, unspecified cervical region: Secondary | ICD-10-CM | POA: Diagnosis not present

## 2021-11-15 DIAGNOSIS — M180 Bilateral primary osteoarthritis of first carpometacarpal joints: Secondary | ICD-10-CM | POA: Diagnosis not present

## 2021-11-15 DIAGNOSIS — E663 Overweight: Secondary | ICD-10-CM | POA: Diagnosis not present

## 2021-11-15 DIAGNOSIS — M150 Primary generalized (osteo)arthritis: Secondary | ICD-10-CM | POA: Diagnosis not present

## 2021-11-15 DIAGNOSIS — M65332 Trigger finger, left middle finger: Secondary | ICD-10-CM | POA: Diagnosis not present

## 2021-11-15 DIAGNOSIS — M5136 Other intervertebral disc degeneration, lumbar region: Secondary | ICD-10-CM | POA: Diagnosis not present

## 2021-11-15 DIAGNOSIS — Z6829 Body mass index (BMI) 29.0-29.9, adult: Secondary | ICD-10-CM | POA: Diagnosis not present

## 2021-12-06 DIAGNOSIS — Z0101 Encounter for examination of eyes and vision with abnormal findings: Secondary | ICD-10-CM | POA: Diagnosis not present

## 2021-12-06 DIAGNOSIS — H5213 Myopia, bilateral: Secondary | ICD-10-CM | POA: Diagnosis not present

## 2021-12-11 DIAGNOSIS — M5030 Other cervical disc degeneration, unspecified cervical region: Secondary | ICD-10-CM | POA: Diagnosis not present

## 2021-12-11 DIAGNOSIS — R69 Illness, unspecified: Secondary | ICD-10-CM | POA: Diagnosis not present

## 2021-12-11 DIAGNOSIS — M159 Polyosteoarthritis, unspecified: Secondary | ICD-10-CM | POA: Diagnosis not present

## 2021-12-11 DIAGNOSIS — I10 Essential (primary) hypertension: Secondary | ICD-10-CM | POA: Diagnosis not present

## 2022-01-03 DIAGNOSIS — Z881 Allergy status to other antibiotic agents status: Secondary | ICD-10-CM | POA: Diagnosis not present

## 2022-01-03 DIAGNOSIS — M79671 Pain in right foot: Secondary | ICD-10-CM | POA: Diagnosis not present

## 2022-01-03 DIAGNOSIS — M89371 Hypertrophy of bone, right ankle and foot: Secondary | ICD-10-CM | POA: Diagnosis not present

## 2022-01-03 DIAGNOSIS — M76821 Posterior tibial tendinitis, right leg: Secondary | ICD-10-CM | POA: Diagnosis not present

## 2022-01-03 DIAGNOSIS — M7751 Other enthesopathy of right foot: Secondary | ICD-10-CM | POA: Diagnosis not present

## 2022-01-03 DIAGNOSIS — M19071 Primary osteoarthritis, right ankle and foot: Secondary | ICD-10-CM | POA: Diagnosis not present

## 2022-01-03 DIAGNOSIS — M7731 Calcaneal spur, right foot: Secondary | ICD-10-CM | POA: Diagnosis not present

## 2022-01-03 DIAGNOSIS — M2011 Hallux valgus (acquired), right foot: Secondary | ICD-10-CM | POA: Diagnosis not present

## 2022-01-03 DIAGNOSIS — M61571 Other ossification of muscle, right ankle and foot: Secondary | ICD-10-CM | POA: Diagnosis not present

## 2022-01-03 DIAGNOSIS — R748 Abnormal levels of other serum enzymes: Secondary | ICD-10-CM | POA: Diagnosis not present

## 2022-01-03 DIAGNOSIS — I10 Essential (primary) hypertension: Secondary | ICD-10-CM | POA: Diagnosis not present

## 2022-01-11 DIAGNOSIS — M159 Polyosteoarthritis, unspecified: Secondary | ICD-10-CM | POA: Diagnosis not present

## 2022-01-11 DIAGNOSIS — I10 Essential (primary) hypertension: Secondary | ICD-10-CM | POA: Diagnosis not present

## 2022-01-11 DIAGNOSIS — R69 Illness, unspecified: Secondary | ICD-10-CM | POA: Diagnosis not present

## 2022-01-11 DIAGNOSIS — M5030 Other cervical disc degeneration, unspecified cervical region: Secondary | ICD-10-CM | POA: Diagnosis not present

## 2022-01-13 IMAGING — US US ABDOMEN COMPLETE
1 series · 14 of 25 positions shown · non-contrast
Comparison: None.

CLINICAL DATA: Elevated alk-phos.

EXAM:
ABDOMEN ULTRASOUND COMPLETE

[Series 1: us abdomen complete · 0.20mm/px · 14 of 93 slices shown]
[im 1/93]
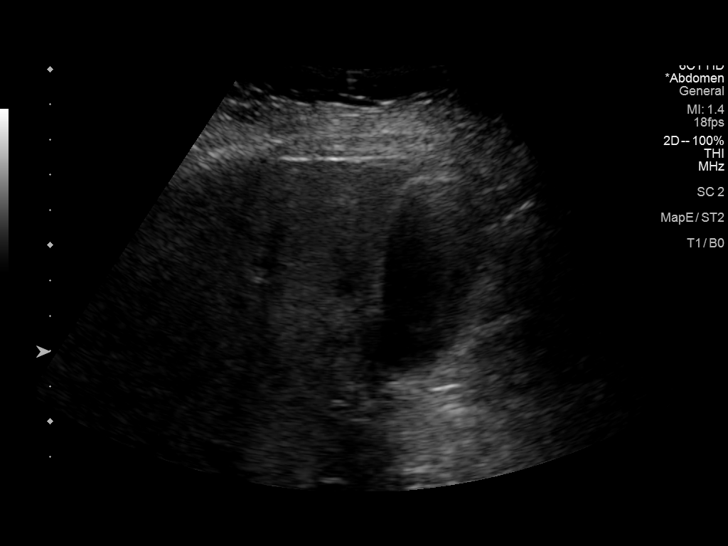
[im 8/93]
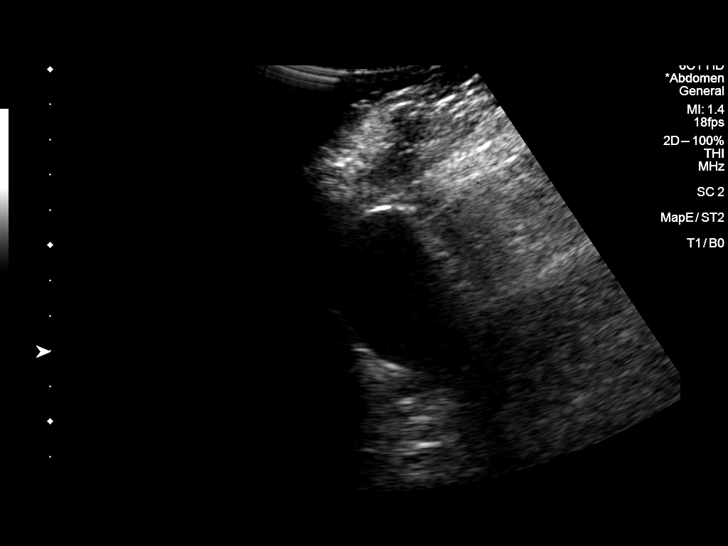
[im 16/93]
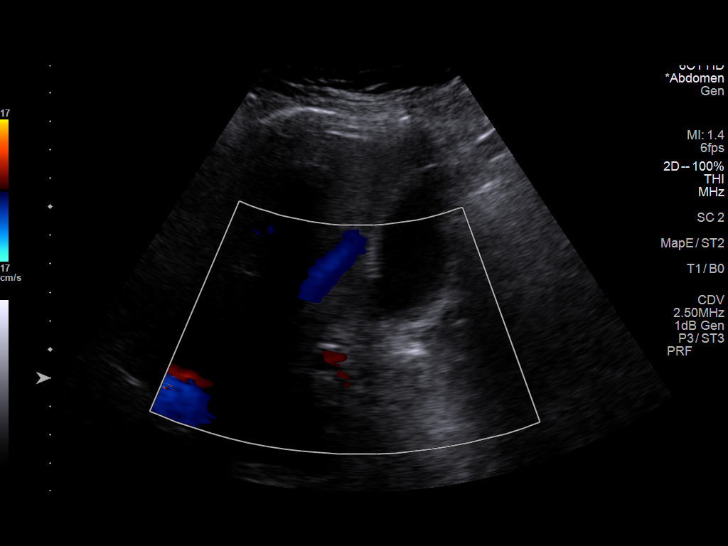
[im 24/93]
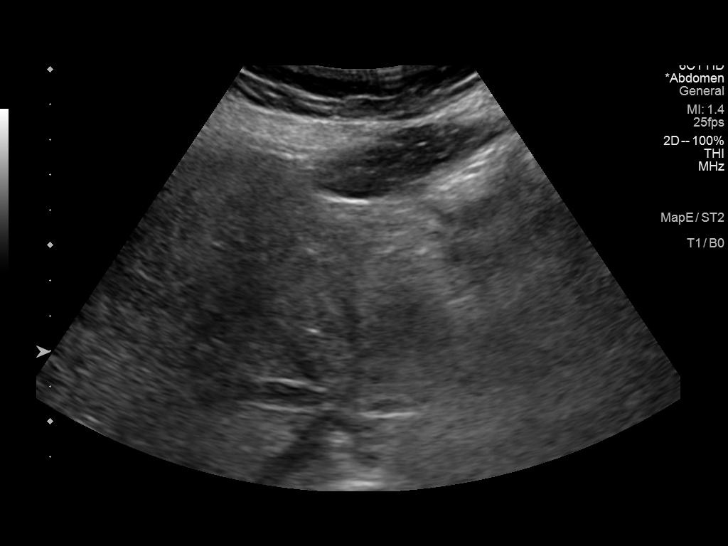
[im 31/93]
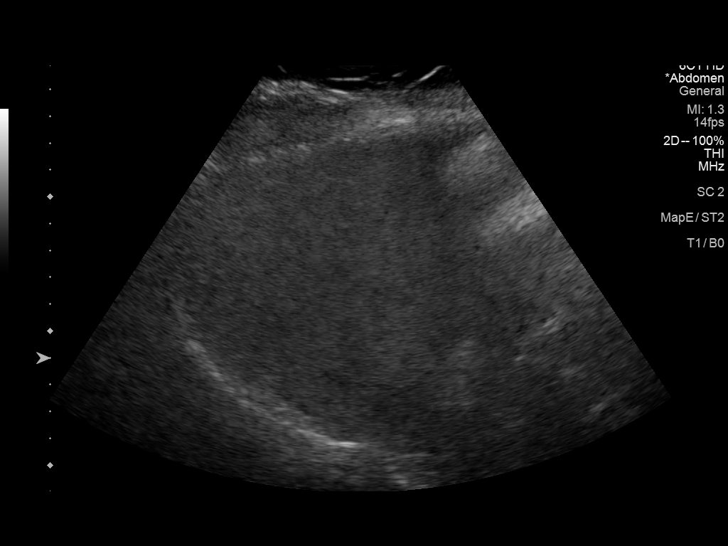
[im 35/93]
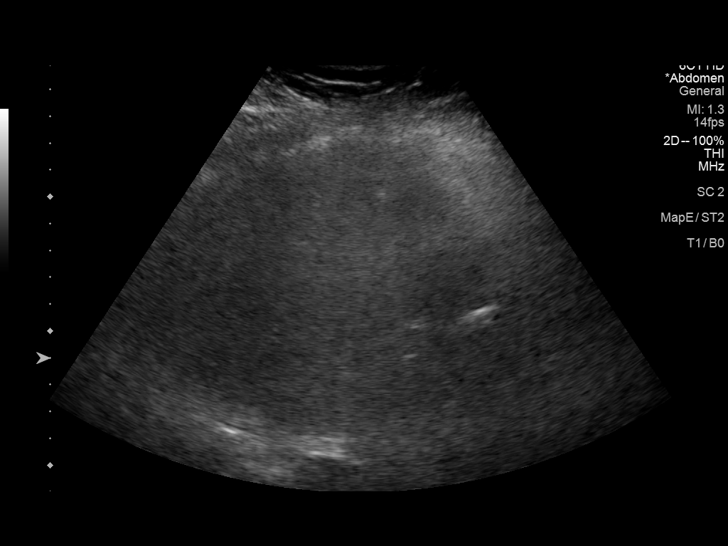
[im 43/93]
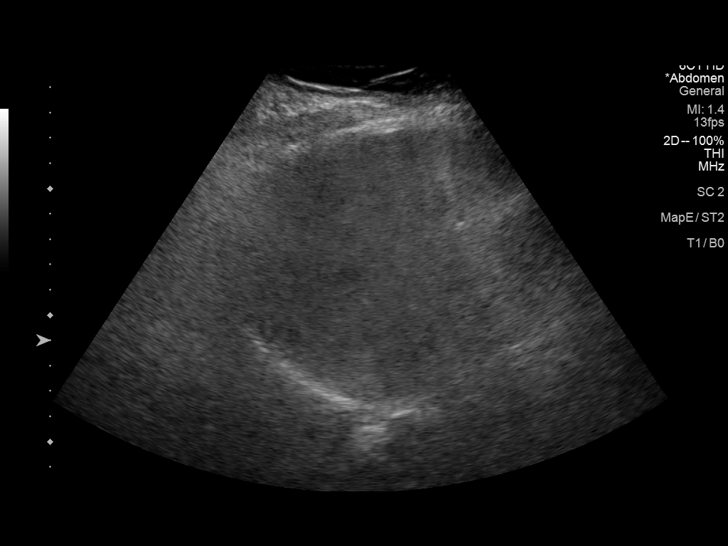
[im 50/93]
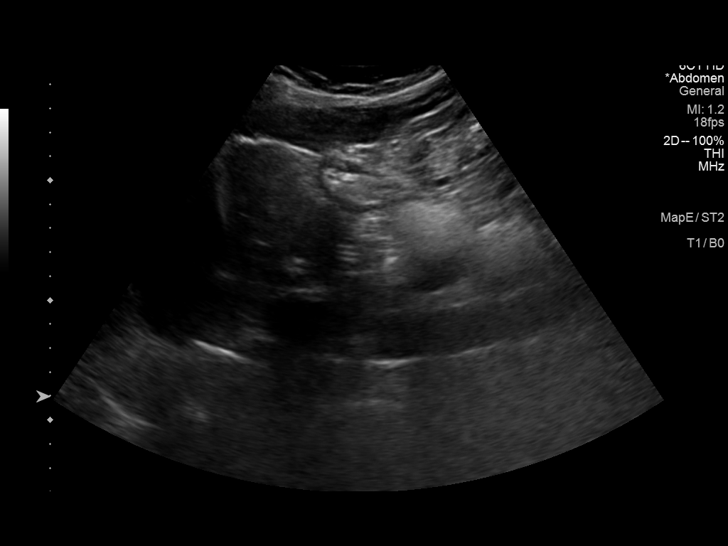
[im 58/93]
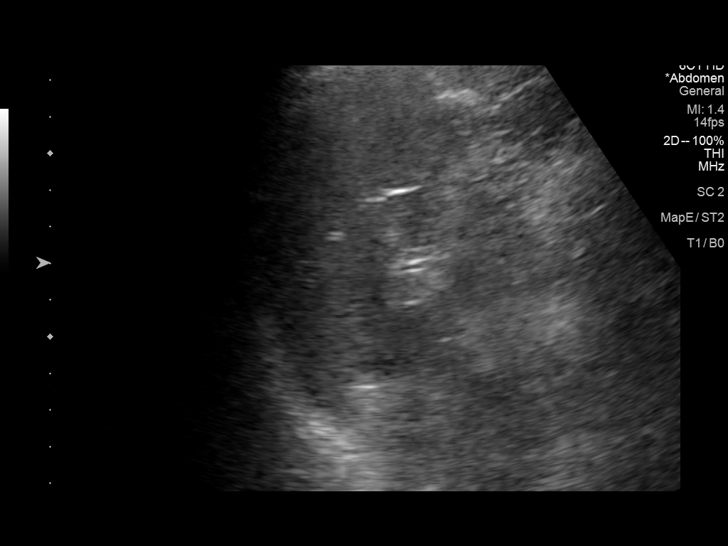
[im 62/93]
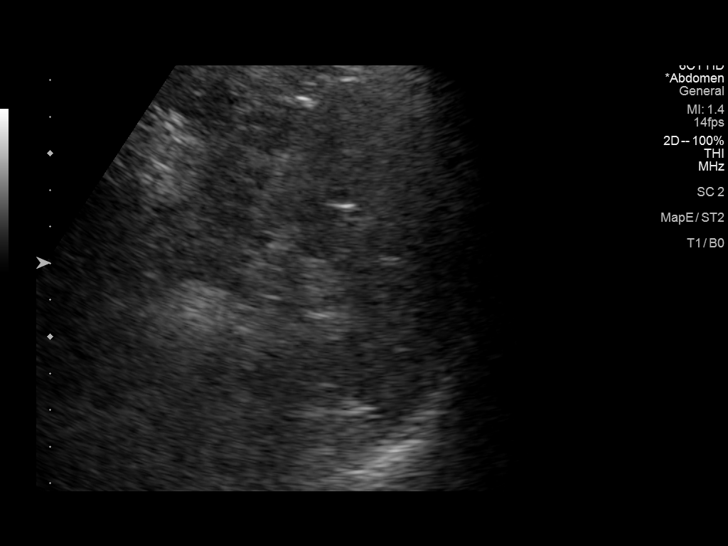
[im 70/93]
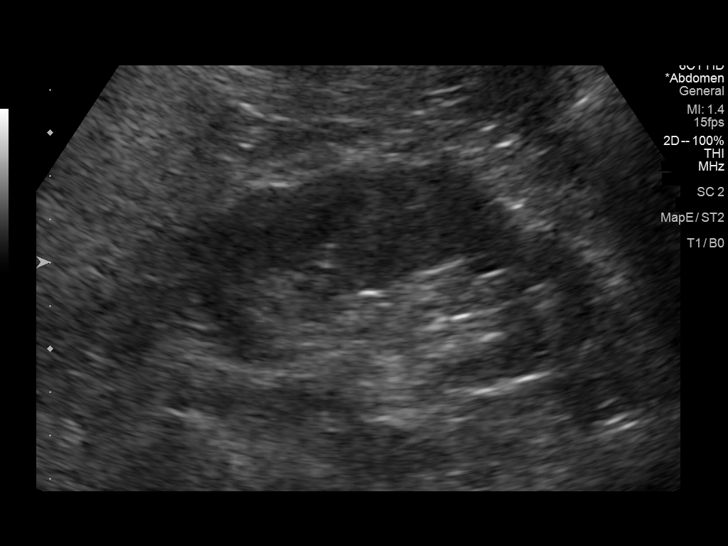
[im 77/93]
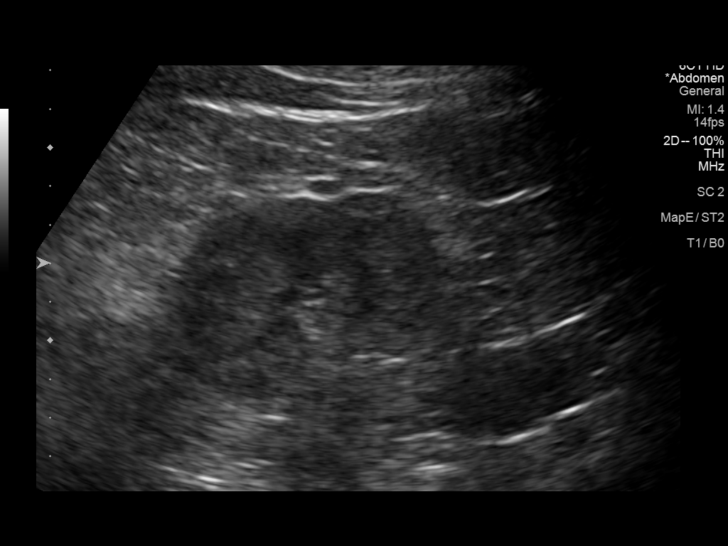
[im 85/93]
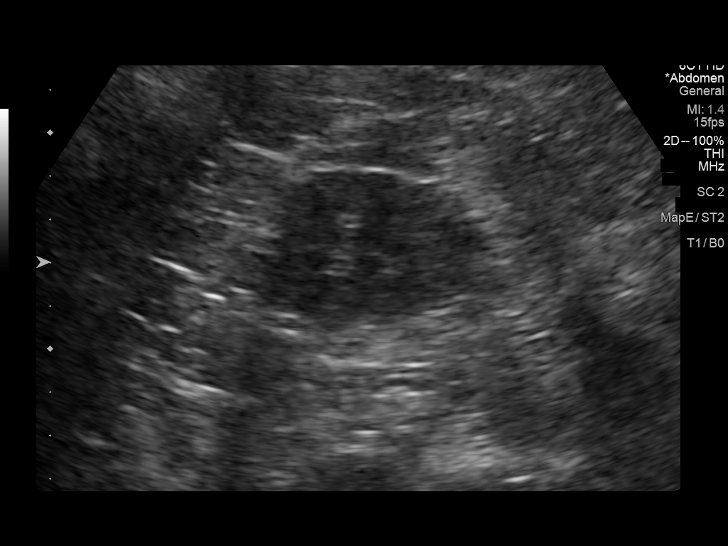
[im 93/93]
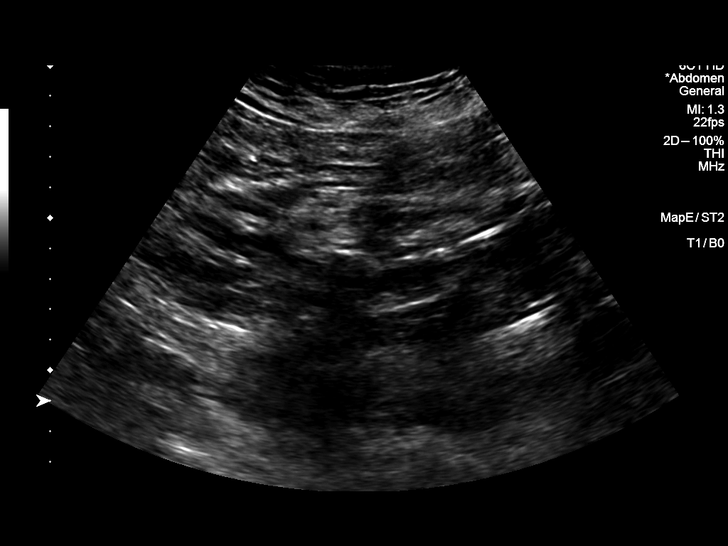

[14 of 25 positions shown; findings below may reference images not displayed]

FINDINGS: Gallbladder: No gallstones or wall thickening visualized. No
sonographic Murphy sign noted by sonographer.

Common bile duct: Diameter: 3 mm

Liver: There is diffuse increased liver echogenicity most commonly
seen in the setting of fatty infiltration. Superimposed inflammation
or fibrosis is not excluded. Clinical correlation is recommended.
Portal vein is patent on color Doppler imaging with normal direction
of blood flow towards the liver.

IVC: No abnormality visualized.

Pancreas: Visualized portion unremarkable.

Spleen: Size and appearance within normal limits.

Right Kidney: Length: 10.1 cm. Echogenicity within normal limits. No
mass or hydronephrosis visualized.

Left Kidney: Length: 10.4 cm. Echogenicity within normal limits. No
mass or hydronephrosis visualized.

Abdominal aorta: No aneurysm visualized.

Other findings: None.
IMPRESSION: Fatty liver, otherwise unremarkable abdominal ultrasound.

## 2022-01-31 DIAGNOSIS — M8589 Other specified disorders of bone density and structure, multiple sites: Secondary | ICD-10-CM | POA: Diagnosis not present

## 2022-01-31 DIAGNOSIS — M8000XD Age-related osteoporosis with current pathological fracture, unspecified site, subsequent encounter for fracture with routine healing: Secondary | ICD-10-CM | POA: Diagnosis not present

## 2022-01-31 DIAGNOSIS — I10 Essential (primary) hypertension: Secondary | ICD-10-CM | POA: Diagnosis not present

## 2022-01-31 DIAGNOSIS — R748 Abnormal levels of other serum enzymes: Secondary | ICD-10-CM | POA: Diagnosis not present

## 2022-02-07 DIAGNOSIS — R748 Abnormal levels of other serum enzymes: Secondary | ICD-10-CM | POA: Diagnosis not present

## 2022-02-07 DIAGNOSIS — H25013 Cortical age-related cataract, bilateral: Secondary | ICD-10-CM | POA: Diagnosis not present

## 2022-02-07 DIAGNOSIS — H25043 Posterior subcapsular polar age-related cataract, bilateral: Secondary | ICD-10-CM | POA: Diagnosis not present

## 2022-02-07 DIAGNOSIS — H18413 Arcus senilis, bilateral: Secondary | ICD-10-CM | POA: Diagnosis not present

## 2022-02-07 DIAGNOSIS — H2512 Age-related nuclear cataract, left eye: Secondary | ICD-10-CM | POA: Diagnosis not present

## 2022-02-07 DIAGNOSIS — H2513 Age-related nuclear cataract, bilateral: Secondary | ICD-10-CM | POA: Diagnosis not present

## 2022-02-15 DIAGNOSIS — M79672 Pain in left foot: Secondary | ICD-10-CM | POA: Diagnosis not present

## 2022-02-15 DIAGNOSIS — M2041 Other hammer toe(s) (acquired), right foot: Secondary | ICD-10-CM | POA: Diagnosis not present

## 2022-02-20 DIAGNOSIS — R748 Abnormal levels of other serum enzymes: Secondary | ICD-10-CM | POA: Diagnosis not present

## 2022-02-25 DIAGNOSIS — M180 Bilateral primary osteoarthritis of first carpometacarpal joints: Secondary | ICD-10-CM | POA: Diagnosis not present

## 2022-02-25 DIAGNOSIS — Z6830 Body mass index (BMI) 30.0-30.9, adult: Secondary | ICD-10-CM | POA: Diagnosis not present

## 2022-02-25 DIAGNOSIS — M5136 Other intervertebral disc degeneration, lumbar region: Secondary | ICD-10-CM | POA: Diagnosis not present

## 2022-02-25 DIAGNOSIS — M1991 Primary osteoarthritis, unspecified site: Secondary | ICD-10-CM | POA: Diagnosis not present

## 2022-02-25 DIAGNOSIS — E669 Obesity, unspecified: Secondary | ICD-10-CM | POA: Diagnosis not present

## 2022-02-25 DIAGNOSIS — M65332 Trigger finger, left middle finger: Secondary | ICD-10-CM | POA: Diagnosis not present

## 2022-03-05 DIAGNOSIS — M810 Age-related osteoporosis without current pathological fracture: Secondary | ICD-10-CM | POA: Diagnosis not present

## 2022-03-27 IMAGING — CT CT CARDIAC CORONARY ARTERY CALCIUM SCORE
3 series · 13 of 20 positions shown, 15 images · non-contrast
Comparison: None.

CLINICAL DATA: 70-year-old white female with family history of
coronary artery disease. Essential hypertension.

EXAM:
CT CARDIAC CORONARY ARTERY CALCIUM SCORE
TECHNIQUE: Non-contrast imaging through the heart was performed using
prospective ECG gating. Image post processing was performed on an
independent workstation, allowing for quantitative analysis of the
heart and coronary arteries. Note that this exam targets the heart
and the chest was not imaged in its entirety.

[Series 2: calcium scoring 2.00 qr36 bestdiast 69% hrt calciu · axial · 0.42mm/px · z∈[+1695,+1751]mm · 3 of 70 slices shown]
[im 14/70  vessel]
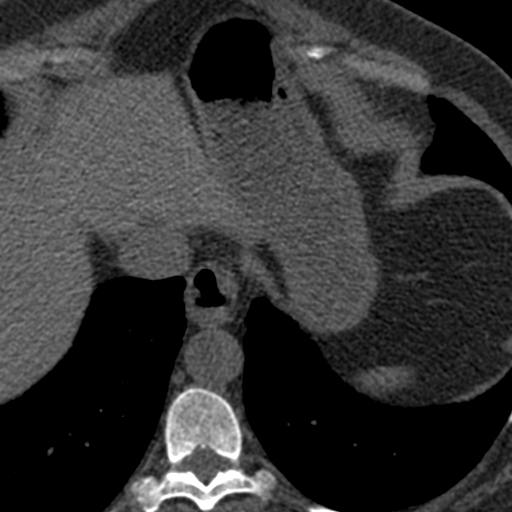
[im 28/70  vessel]
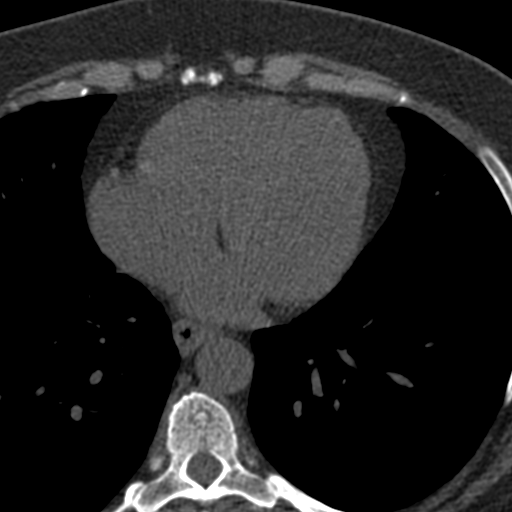
[im 42/70  vessel]
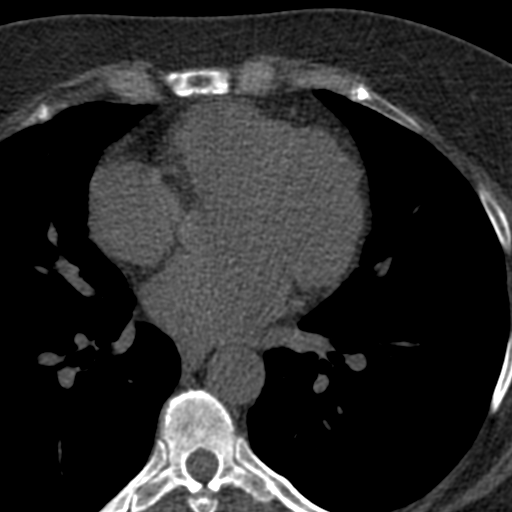

[Series 3: calcium scoring 2.00 br40 bestdiast 69% axial · axial · 0.54mm/px · z∈[+1691,+1783]mm · 5 of 70 slices shown, 7 images]
[im 12/70  vessel]
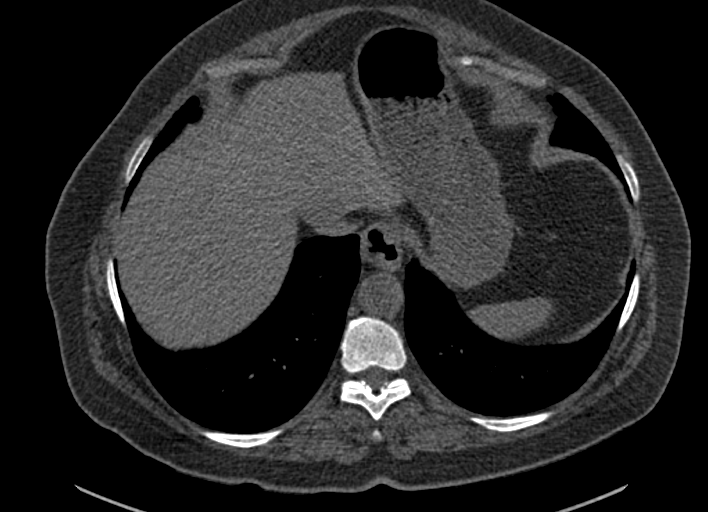
[im 12/70  lung]
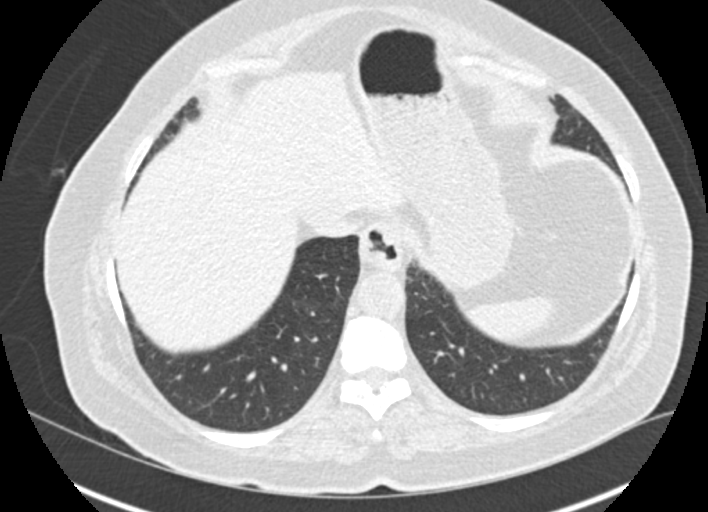
[im 24/70  vessel]
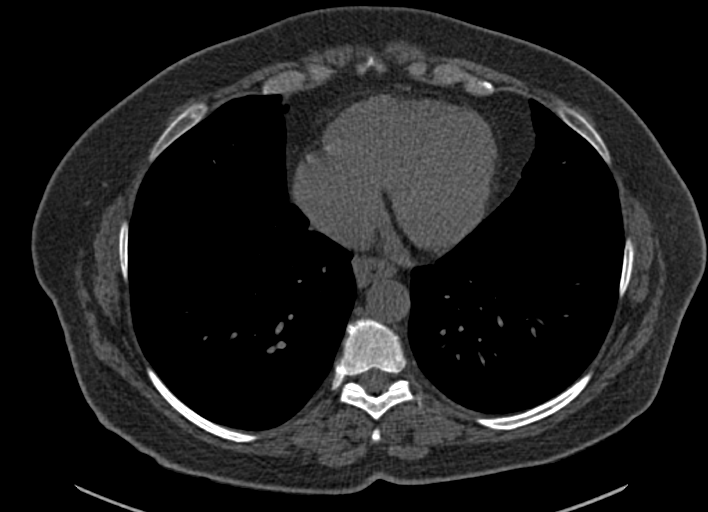
[im 35/70  vessel]
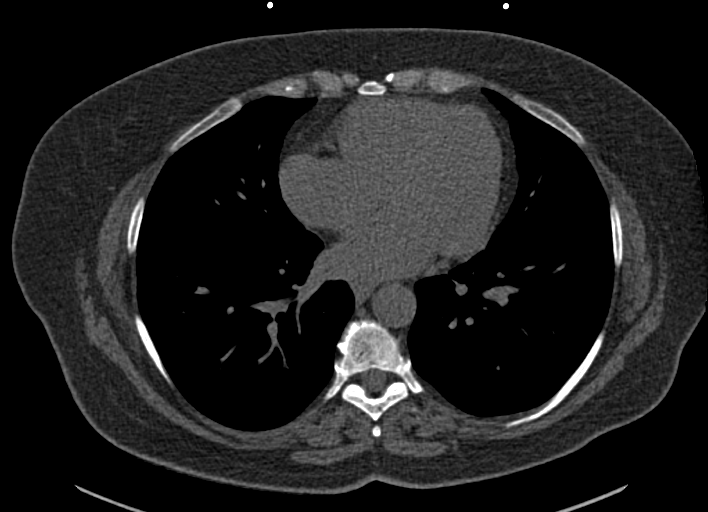
[im 47/70  vessel]
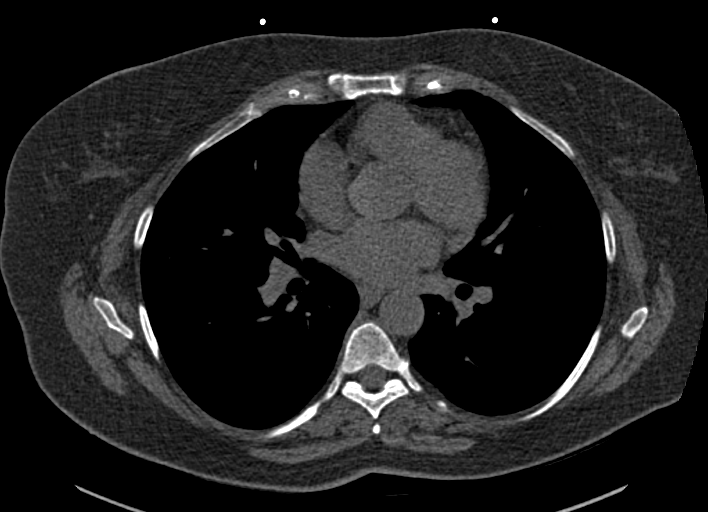
[im 58/70  vessel]
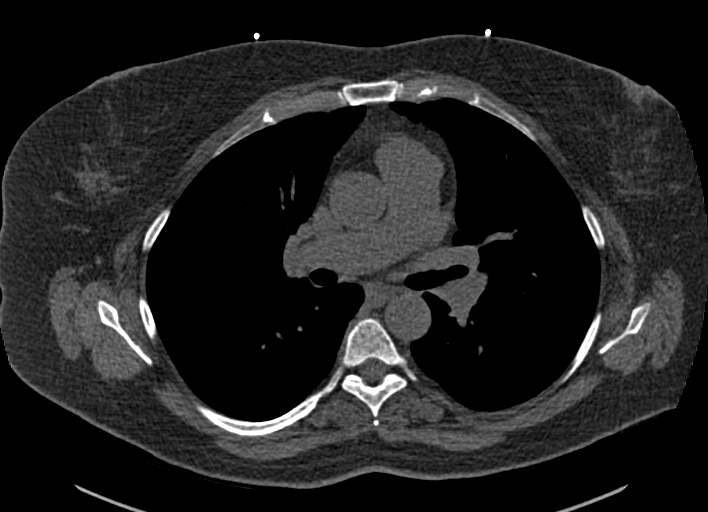
[im 58/70  lung]
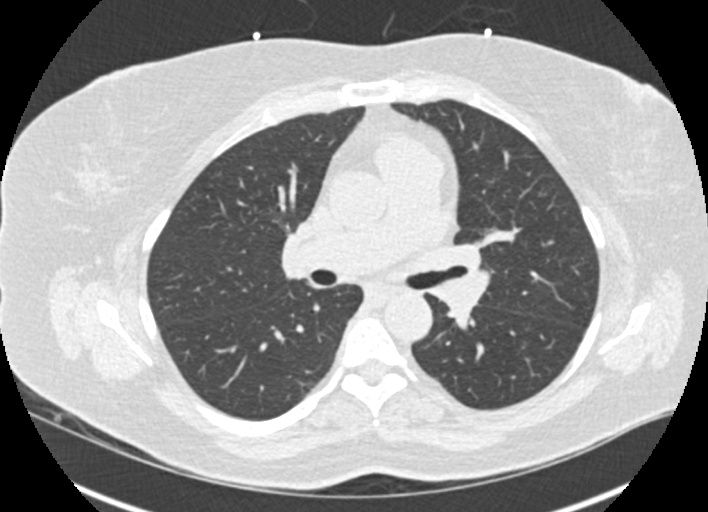

[Series 9: calcium scoring 2.00 br60 bestdiast 69% lungs · axial · 0.54mm/px · z∈[+1691,+1783]mm · 5 of 70 slices shown]
[im 12/70  vessel]
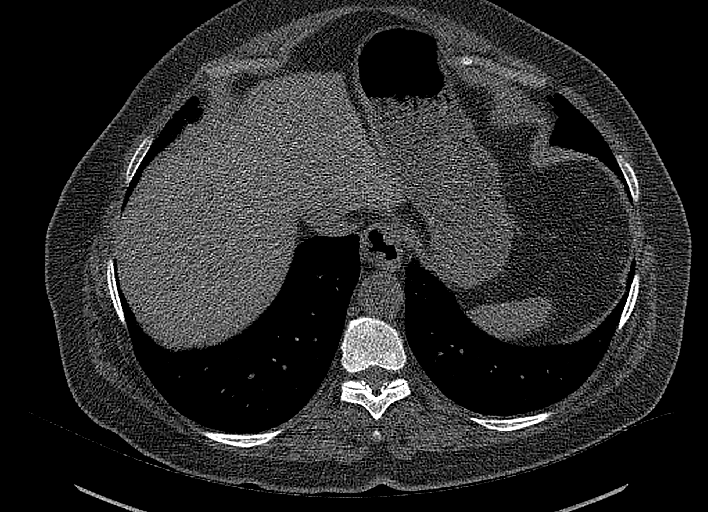
[im 24/70  vessel]
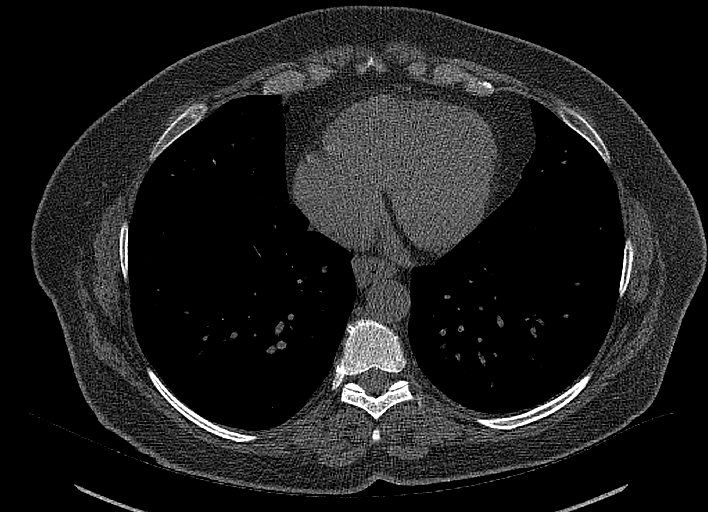
[im 35/70  vessel]
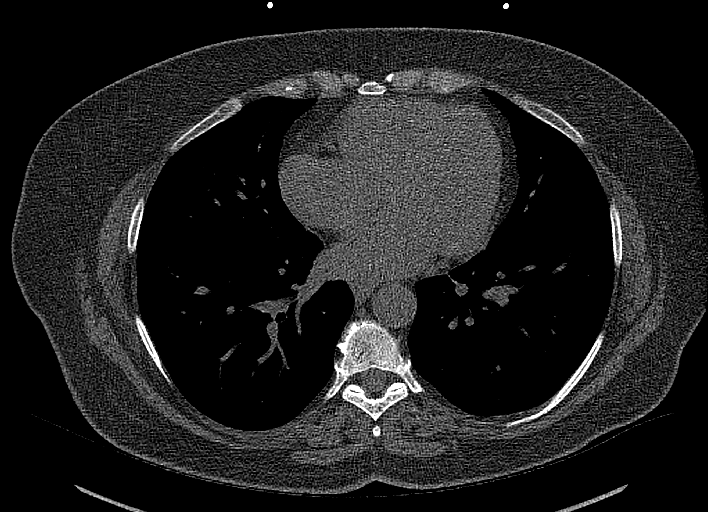
[im 47/70  vessel]
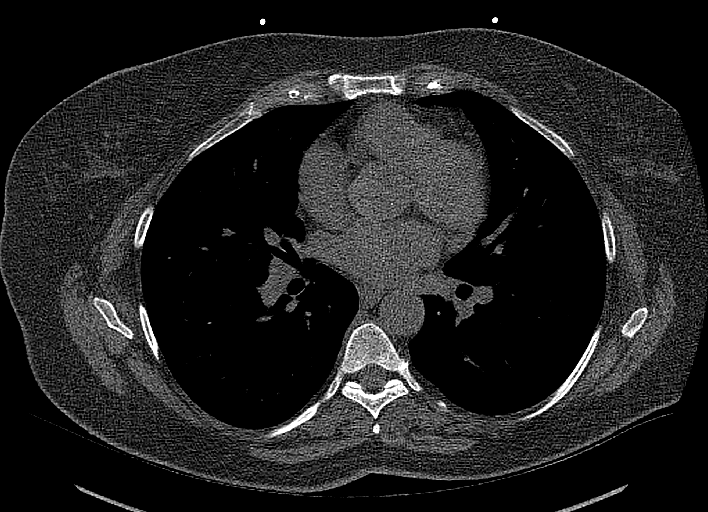
[im 58/70  vessel]
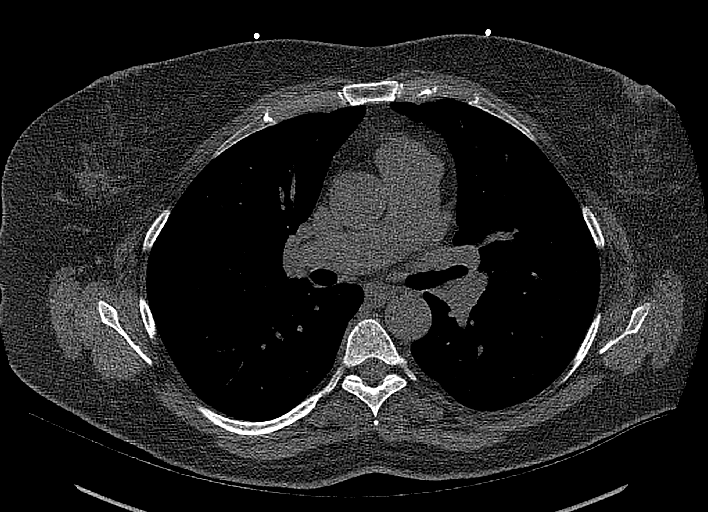

[13 of 20 positions shown; findings below may reference images not displayed]

FINDINGS: CORONARY CALCIUM SCORES:

Left Main: 0

LAD: 0

LCx: 0

RCA: 0

Total Agatston Score: 0

[HOSPITAL] percentile: 0

AORTA MEASUREMENTS:

Ascending Aorta: 32 mm

Descending Aorta: 27 mm

OTHER FINDINGS:

Visualized mediastinal structures are normal. Small hiatal hernia.
Images of the upper abdomen are unremarkable. Small hazy lung
densities in the left upper lobe and left lower lobe lung. An
example of this is on series 9, image 8. These hazy densities are
nonspecific but favor post inflammatory changes. No pleural
effusions. No acute bone abnormality.
IMPRESSION: 1. Coronary calcium score is 0.
2. Subtle hazy patchy densities in the left lung. Findings are
nonspecific but favor post inflammatory changes. Recommend
correlation with any recent respiratory illness or infection.
3. Small hiatal hernia.

## 2022-04-01 DIAGNOSIS — Z85828 Personal history of other malignant neoplasm of skin: Secondary | ICD-10-CM | POA: Diagnosis not present

## 2022-04-01 DIAGNOSIS — D225 Melanocytic nevi of trunk: Secondary | ICD-10-CM | POA: Diagnosis not present

## 2022-04-01 DIAGNOSIS — W57XXXA Bitten or stung by nonvenomous insect and other nonvenomous arthropods, initial encounter: Secondary | ICD-10-CM | POA: Diagnosis not present

## 2022-04-01 DIAGNOSIS — C44311 Basal cell carcinoma of skin of nose: Secondary | ICD-10-CM | POA: Diagnosis not present

## 2022-04-01 DIAGNOSIS — L578 Other skin changes due to chronic exposure to nonionizing radiation: Secondary | ICD-10-CM | POA: Diagnosis not present

## 2022-04-01 DIAGNOSIS — D1801 Hemangioma of skin and subcutaneous tissue: Secondary | ICD-10-CM | POA: Diagnosis not present

## 2022-04-01 DIAGNOSIS — D2272 Melanocytic nevi of left lower limb, including hip: Secondary | ICD-10-CM | POA: Diagnosis not present

## 2022-04-01 DIAGNOSIS — D2261 Melanocytic nevi of right upper limb, including shoulder: Secondary | ICD-10-CM | POA: Diagnosis not present

## 2022-04-01 DIAGNOSIS — B078 Other viral warts: Secondary | ICD-10-CM | POA: Diagnosis not present

## 2022-04-01 DIAGNOSIS — R238 Other skin changes: Secondary | ICD-10-CM | POA: Diagnosis not present

## 2022-04-01 DIAGNOSIS — L821 Other seborrheic keratosis: Secondary | ICD-10-CM | POA: Diagnosis not present

## 2022-04-01 DIAGNOSIS — D485 Neoplasm of uncertain behavior of skin: Secondary | ICD-10-CM | POA: Diagnosis not present

## 2022-04-29 DIAGNOSIS — Z1231 Encounter for screening mammogram for malignant neoplasm of breast: Secondary | ICD-10-CM | POA: Diagnosis not present

## 2022-05-08 DIAGNOSIS — H2512 Age-related nuclear cataract, left eye: Secondary | ICD-10-CM | POA: Diagnosis not present

## 2022-05-09 DIAGNOSIS — H2511 Age-related nuclear cataract, right eye: Secondary | ICD-10-CM | POA: Diagnosis not present

## 2022-05-22 DIAGNOSIS — H2511 Age-related nuclear cataract, right eye: Secondary | ICD-10-CM | POA: Diagnosis not present

## 2022-06-05 DIAGNOSIS — M1991 Primary osteoarthritis, unspecified site: Secondary | ICD-10-CM | POA: Diagnosis not present

## 2022-06-05 DIAGNOSIS — Z6831 Body mass index (BMI) 31.0-31.9, adult: Secondary | ICD-10-CM | POA: Diagnosis not present

## 2022-06-05 DIAGNOSIS — M180 Bilateral primary osteoarthritis of first carpometacarpal joints: Secondary | ICD-10-CM | POA: Diagnosis not present

## 2022-06-05 DIAGNOSIS — E669 Obesity, unspecified: Secondary | ICD-10-CM | POA: Diagnosis not present

## 2022-06-05 DIAGNOSIS — M5136 Other intervertebral disc degeneration, lumbar region: Secondary | ICD-10-CM | POA: Diagnosis not present

## 2022-06-05 DIAGNOSIS — M65332 Trigger finger, left middle finger: Secondary | ICD-10-CM | POA: Diagnosis not present

## 2022-06-21 DIAGNOSIS — M25552 Pain in left hip: Secondary | ICD-10-CM | POA: Diagnosis not present

## 2022-06-21 DIAGNOSIS — M545 Low back pain, unspecified: Secondary | ICD-10-CM | POA: Diagnosis not present

## 2022-07-09 DIAGNOSIS — R5383 Other fatigue: Secondary | ICD-10-CM | POA: Diagnosis not present

## 2022-07-09 DIAGNOSIS — M25552 Pain in left hip: Secondary | ICD-10-CM | POA: Diagnosis not present

## 2022-07-09 DIAGNOSIS — M545 Low back pain, unspecified: Secondary | ICD-10-CM | POA: Diagnosis not present

## 2022-07-09 DIAGNOSIS — I10 Essential (primary) hypertension: Secondary | ICD-10-CM | POA: Diagnosis not present

## 2022-07-12 DIAGNOSIS — E041 Nontoxic single thyroid nodule: Secondary | ICD-10-CM | POA: Diagnosis not present

## 2022-07-12 DIAGNOSIS — R5383 Other fatigue: Secondary | ICD-10-CM | POA: Diagnosis not present

## 2022-07-12 DIAGNOSIS — Z0000 Encounter for general adult medical examination without abnormal findings: Secondary | ICD-10-CM | POA: Diagnosis not present

## 2022-07-12 DIAGNOSIS — Z6831 Body mass index (BMI) 31.0-31.9, adult: Secondary | ICD-10-CM | POA: Diagnosis not present

## 2022-07-12 DIAGNOSIS — I10 Essential (primary) hypertension: Secondary | ICD-10-CM | POA: Diagnosis not present

## 2022-07-12 DIAGNOSIS — Z23 Encounter for immunization: Secondary | ICD-10-CM | POA: Diagnosis not present

## 2022-07-16 DIAGNOSIS — M25552 Pain in left hip: Secondary | ICD-10-CM | POA: Diagnosis not present

## 2022-07-16 DIAGNOSIS — M545 Low back pain, unspecified: Secondary | ICD-10-CM | POA: Diagnosis not present

## 2022-07-31 DIAGNOSIS — Z23 Encounter for immunization: Secondary | ICD-10-CM | POA: Diagnosis not present

## 2022-07-31 DIAGNOSIS — E041 Nontoxic single thyroid nodule: Secondary | ICD-10-CM | POA: Diagnosis not present

## 2022-08-02 DIAGNOSIS — C44311 Basal cell carcinoma of skin of nose: Secondary | ICD-10-CM | POA: Diagnosis not present

## 2022-08-06 DIAGNOSIS — C44311 Basal cell carcinoma of skin of nose: Secondary | ICD-10-CM | POA: Diagnosis not present

## 2022-08-07 ENCOUNTER — Other Ambulatory Visit: Payer: Self-pay | Admitting: Internal Medicine

## 2022-08-07 DIAGNOSIS — E041 Nontoxic single thyroid nodule: Secondary | ICD-10-CM

## 2022-08-14 DIAGNOSIS — G4700 Insomnia, unspecified: Secondary | ICD-10-CM | POA: Diagnosis not present

## 2022-08-14 DIAGNOSIS — Z01419 Encounter for gynecological examination (general) (routine) without abnormal findings: Secondary | ICD-10-CM | POA: Diagnosis not present

## 2022-09-09 DIAGNOSIS — M810 Age-related osteoporosis without current pathological fracture: Secondary | ICD-10-CM | POA: Diagnosis not present

## 2022-09-12 DIAGNOSIS — Z85828 Personal history of other malignant neoplasm of skin: Secondary | ICD-10-CM | POA: Diagnosis not present

## 2022-09-12 DIAGNOSIS — Z5189 Encounter for other specified aftercare: Secondary | ICD-10-CM | POA: Diagnosis not present

## 2022-09-16 DIAGNOSIS — M19049 Primary osteoarthritis, unspecified hand: Secondary | ICD-10-CM | POA: Diagnosis not present

## 2022-09-16 DIAGNOSIS — M47812 Spondylosis without myelopathy or radiculopathy, cervical region: Secondary | ICD-10-CM | POA: Diagnosis not present

## 2022-09-16 DIAGNOSIS — Z79891 Long term (current) use of opiate analgesic: Secondary | ICD-10-CM | POA: Diagnosis not present

## 2022-09-16 DIAGNOSIS — G894 Chronic pain syndrome: Secondary | ICD-10-CM | POA: Diagnosis not present

## 2022-09-16 DIAGNOSIS — M47816 Spondylosis without myelopathy or radiculopathy, lumbar region: Secondary | ICD-10-CM | POA: Diagnosis not present

## 2022-10-01 ENCOUNTER — Encounter (HOSPITAL_BASED_OUTPATIENT_CLINIC_OR_DEPARTMENT_OTHER): Payer: Self-pay

## 2022-10-01 ENCOUNTER — Ambulatory Visit (HOSPITAL_BASED_OUTPATIENT_CLINIC_OR_DEPARTMENT_OTHER)
Admission: RE | Admit: 2022-10-01 | Discharge: 2022-10-01 | Disposition: A | Discharge status: Home / Self Care | Payer: Medicare HMO | Source: Ambulatory Visit | Attending: Emergency Medicine | Admitting: Emergency Medicine

## 2022-10-01 DIAGNOSIS — R911 Solitary pulmonary nodule: Secondary | ICD-10-CM | POA: Diagnosis not present

## 2022-10-01 DIAGNOSIS — J479 Bronchiectasis, uncomplicated: Secondary | ICD-10-CM | POA: Diagnosis not present

## 2022-10-01 DIAGNOSIS — R9389 Abnormal findings on diagnostic imaging of other specified body structures: Secondary | ICD-10-CM | POA: Diagnosis not present

## 2022-10-15 DIAGNOSIS — M47812 Spondylosis without myelopathy or radiculopathy, cervical region: Secondary | ICD-10-CM | POA: Diagnosis not present

## 2022-10-15 DIAGNOSIS — M47816 Spondylosis without myelopathy or radiculopathy, lumbar region: Secondary | ICD-10-CM | POA: Diagnosis not present

## 2022-10-15 DIAGNOSIS — G894 Chronic pain syndrome: Secondary | ICD-10-CM | POA: Diagnosis not present

## 2022-10-15 DIAGNOSIS — M19049 Primary osteoarthritis, unspecified hand: Secondary | ICD-10-CM | POA: Diagnosis not present

## 2022-10-23 ENCOUNTER — Other Ambulatory Visit: Payer: Medicare HMO

## 2022-10-30 ENCOUNTER — Ambulatory Visit
Admission: RE | Admit: 2022-10-30 | Discharge: 2022-10-30 | Disposition: A | Discharge status: Home / Self Care | Payer: Medicare HMO | Source: Ambulatory Visit | Attending: Internal Medicine | Admitting: Internal Medicine

## 2022-10-30 DIAGNOSIS — E041 Nontoxic single thyroid nodule: Secondary | ICD-10-CM

## 2022-11-06 ENCOUNTER — Ambulatory Visit: Payer: Medicare HMO | Admitting: Emergency Medicine

## 2022-11-06 ENCOUNTER — Encounter: Payer: Self-pay | Admitting: Emergency Medicine

## 2022-11-06 VITALS — BP 128/68 | HR 85 | Temp 97.9°F | Ht 63.5 in | Wt 181.2 lb

## 2022-11-06 DIAGNOSIS — R9389 Abnormal findings on diagnostic imaging of other specified body structures: Secondary | ICD-10-CM | POA: Diagnosis not present

## 2022-11-06 NOTE — Patient Instructions (Signed)
We reviewed your CT scan of the chest today. We will plan to repeat your CT chest without contrast in December 2024. Follow Dr. Lamonte Sakai after your CT scan so we can review the results together.

## 2022-11-06 NOTE — Progress Notes (Signed)
Subjective:    Patient ID: [name], female    DOB: 1950/10/23, 72 y.o.   MRN: [id number]  HPI  ROV 11/06/22 --follow-up visit for 72 year old woman with a minimal prior tobacco history, hypertension, OSA on CPAP.  I saw her 1 year ago for an abnormal CT scan of the chest..  There were bilateral groundglass changes without any focal nodularity of unclear cause.  She was not having any symptoms so we decided to repeat her CT chest after 1 year as below.  CT chest 10/01/2022 reviewed by me shows stable scattered groundglass changes left greater than right and peribronchovascular distribution with some mild associated bronchiectasis, possible very low-grade inflammatory or infectious process..  Also a left thyroid nodule slightly increased from prior for which she underwent with thyroid ultrasound 10/30/2022  Thyroid ultrasound 10/30/2022 showed a left inferior thyroid nodule 1.8 cm that needs ultrasound surveillance.   Review of Systems As per HPI  Past Medical History:  Diagnosis Date   Anxiety    Arthritis KNEES AND HANDS   Bladder tumor    benign   Blood transfusion    DDD (degenerative disc disease)    DDD (degenerative disc disease), lumbar    Hypertension    Normal cardiac stress test 08-10-2004   OSA on CPAP    uses cpap, pt does not know settings   Urgency of urination      Family History  Problem Relation Age of Onset   Cancer Mother        colon   Hypertension Mother    Heart murmur Mother    Cancer Father        lung   Hypertension Father    Diabetes Father    Cancer Brother        rectal     Social History   Socioeconomic History   Marital status: Married    Spouse name: Ted   Number of children: 2   Years of education: 14   Highest education level: Not on file  Occupational History    Comment: [employer]  Tobacco Use   Smoking status: Former    Packs/day: 0.25    Years: 50.00    Total pack years: 12.50    Types: Cigarettes    Quit date:  06/25/1980    Years since quitting: 42.4   Smokeless tobacco: Never  Substance and Sexual Activity   Alcohol use: Yes    Alcohol/week: 1.0 standard drink of alcohol    Types: 1 Glasses of wine per week    Comment: occasional   Drug use: No   Sexual activity: Yes    Birth control/protection: Post-menopausal  Other Topics Concern   Not on file  Social History Narrative   Lives at home with spouse   Caffeine - coffee, 1 cup daily   Social Determinants of Health   Financial Resource Strain: Not on file  Food Insecurity: Not on file  Transportation Needs: Not on file  Physical Activity: Not on file  Stress: Not on file  Social Connections: Not on file  Intimate Partner Violence: Not on file     Allergies  Allergen Reactions   Vancomycin Rash     Outpatient Medications Prior to Visit  Medication Sig Dispense Refill   celecoxib (CELEBREX) 200 MG capsule Take 1 capsule (200 mg total) by mouth every 12 (twelve) hours. 60 capsule 0   cyclobenzaprine (FLEXERIL) 10 MG tablet Take 1 tablet by mouth every 8 (eight) hours as  needed.     diclofenac Sodium (VOLTAREN) 1 % GEL Apply 4 g topically 3 (three) times daily as needed.     HYDROcodone-acetaminophen (NORCO/VICODIN) 5-325 MG tablet Take by mouth as needed.     Multiple Vitamin (MULTIVITAMIN WITH MINERALS) TABS tablet Take 1 tablet by mouth every morning.     valsartan-hydrochlorothiazide (DIOVAN-HCT) 160-12.5 MG tablet Take 1 tablet by mouth daily.     zolpidem (AMBIEN CR) 12.5 MG CR tablet Take 12.5 mg by mouth at bedtime as needed for sleep.      ALPRAZolam (XANAX) 0.25 MG tablet Take 0.25 mg by mouth 2 (two) times daily as needed. (Patient not taking: Reported on 10/19/2021)     amLODipine (NORVASC) 10 MG tablet Take 1 tablet by mouth daily. (Patient not taking: Reported on 11/06/2022)     amLODIPine-Valsartan-HCTZ 10-320-25 MG TABS Take 1 tablet by mouth every morning. (Patient not taking: Reported on 11/06/2022)     cholecalciferol  (VITAMIN D) 1000 units tablet Take 1,000 Units by mouth every morning. (Patient not taking: Reported on 10/19/2021)     estrogens, conjugated, (PREMARIN) 0.45 MG tablet Take 0.45 mg by mouth every morning. Reported on 01/01/2016 (Patient not taking: Reported on 10/19/2021)     sertraline (ZOLOFT) 50 MG tablet Take 50 mg by mouth every morning. (Patient not taking: Reported on 10/19/2021)     venlafaxine XR (EFFEXOR-XR) 75 MG 24 hr capsule venlafaxine ER 75 mg capsule,extended release 24 hr  TAKE 1 CAPSULE BY MOUTH EVERY DAY WITH FOOD (Patient not taking: Reported on 10/19/2021)     No facility-administered medications prior to visit.         Objective:   Physical Exam Vitals:   11/06/22 0946  BP: 128/68  Pulse: 85  Temp: 97.9 F (36.6 C)  TempSrc: Oral  SpO2: 97%  Weight: 181 lb 3.2 oz (82.2 kg)  Height: 5' 3.5" (1.613 m)   Gen: Pleasant, well-nourished, in no distress,  normal affect  ENT: No lesions,  mouth clear,  oropharynx clear, no postnasal drip  Neck: No JVD, no stridor  Lungs: No use of accessory muscles, no crackles or wheezing on normal respiration, no wheeze on forced expiration  Cardiovascular: RRR, heart sounds normal, no murmur or gallops, no peripheral edema  Musculoskeletal: No deformities, no cyanosis or clubbing  Neuro: alert, awake, non focal  Skin: Warm, no lesions or rash      Assessment & Plan:   Abnormal CT of the chest Unchanged areas of very subtle groundglass infiltrate, asymptomatic patient.  No clear indication to workup further unless symptoms evolve, no clear indication for bronchoscopy at this time.  Will plan to repeat her CT scan of the chest for surveillance 09/2023.  She had a thyroid nodule on her CT that is being evaluated with ultrasound by Dr. Shelia Media.   Baltazar Apo, MD, PhD 11/12/2022, 12:25 PM [facility] [phone] or if no answer before 7:00PM call [phone] For any issues after 7:00PM please call eLink  [phone]

## 2022-11-12 NOTE — Assessment & Plan Note (Signed)
Unchanged areas of very subtle groundglass infiltrate, asymptomatic patient.  No clear indication to workup further unless symptoms evolve, no clear indication for bronchoscopy at this time.  Will plan to repeat her CT scan of the chest for surveillance 09/2023.  She had a thyroid nodule on her CT that is being evaluated with ultrasound by Dr. Shelia Media.

## 2022-11-13 DIAGNOSIS — G894 Chronic pain syndrome: Secondary | ICD-10-CM | POA: Diagnosis not present

## 2022-11-13 DIAGNOSIS — M19049 Primary osteoarthritis, unspecified hand: Secondary | ICD-10-CM | POA: Diagnosis not present

## 2022-11-13 DIAGNOSIS — Z5189 Encounter for other specified aftercare: Secondary | ICD-10-CM | POA: Diagnosis not present

## 2022-11-13 DIAGNOSIS — Z85828 Personal history of other malignant neoplasm of skin: Secondary | ICD-10-CM | POA: Diagnosis not present

## 2022-11-13 DIAGNOSIS — M47812 Spondylosis without myelopathy or radiculopathy, cervical region: Secondary | ICD-10-CM | POA: Diagnosis not present

## 2022-11-13 DIAGNOSIS — M47816 Spondylosis without myelopathy or radiculopathy, lumbar region: Secondary | ICD-10-CM | POA: Diagnosis not present

## 2022-11-14 DIAGNOSIS — L853 Xerosis cutis: Secondary | ICD-10-CM | POA: Diagnosis not present

## 2022-11-14 DIAGNOSIS — Q828 Other specified congenital malformations of skin: Secondary | ICD-10-CM | POA: Diagnosis not present

## 2022-12-04 DIAGNOSIS — M5136 Other intervertebral disc degeneration, lumbar region: Secondary | ICD-10-CM | POA: Diagnosis not present

## 2022-12-04 DIAGNOSIS — Z6831 Body mass index (BMI) 31.0-31.9, adult: Secondary | ICD-10-CM | POA: Diagnosis not present

## 2022-12-04 DIAGNOSIS — M65332 Trigger finger, left middle finger: Secondary | ICD-10-CM | POA: Diagnosis not present

## 2022-12-04 DIAGNOSIS — M1991 Primary osteoarthritis, unspecified site: Secondary | ICD-10-CM | POA: Diagnosis not present

## 2022-12-04 DIAGNOSIS — E669 Obesity, unspecified: Secondary | ICD-10-CM | POA: Diagnosis not present

## 2022-12-04 DIAGNOSIS — M180 Bilateral primary osteoarthritis of first carpometacarpal joints: Secondary | ICD-10-CM | POA: Diagnosis not present

## 2022-12-10 DIAGNOSIS — E041 Nontoxic single thyroid nodule: Secondary | ICD-10-CM | POA: Diagnosis not present

## 2022-12-11 DIAGNOSIS — M19049 Primary osteoarthritis, unspecified hand: Secondary | ICD-10-CM | POA: Diagnosis not present

## 2022-12-11 DIAGNOSIS — M47816 Spondylosis without myelopathy or radiculopathy, lumbar region: Secondary | ICD-10-CM | POA: Diagnosis not present

## 2022-12-11 DIAGNOSIS — G894 Chronic pain syndrome: Secondary | ICD-10-CM | POA: Diagnosis not present

## 2022-12-11 DIAGNOSIS — M47812 Spondylosis without myelopathy or radiculopathy, cervical region: Secondary | ICD-10-CM | POA: Diagnosis not present

## 2023-01-07 DIAGNOSIS — M19049 Primary osteoarthritis, unspecified hand: Secondary | ICD-10-CM | POA: Diagnosis not present

## 2023-01-07 DIAGNOSIS — M47816 Spondylosis without myelopathy or radiculopathy, lumbar region: Secondary | ICD-10-CM | POA: Diagnosis not present

## 2023-01-07 DIAGNOSIS — G894 Chronic pain syndrome: Secondary | ICD-10-CM | POA: Diagnosis not present

## 2023-01-07 DIAGNOSIS — M47812 Spondylosis without myelopathy or radiculopathy, cervical region: Secondary | ICD-10-CM | POA: Diagnosis not present

## 2023-01-21 DIAGNOSIS — L309 Dermatitis, unspecified: Secondary | ICD-10-CM | POA: Diagnosis not present

## 2023-02-05 DIAGNOSIS — M47812 Spondylosis without myelopathy or radiculopathy, cervical region: Secondary | ICD-10-CM | POA: Diagnosis not present

## 2023-02-05 DIAGNOSIS — Z79891 Long term (current) use of opiate analgesic: Secondary | ICD-10-CM | POA: Diagnosis not present

## 2023-02-05 DIAGNOSIS — M47816 Spondylosis without myelopathy or radiculopathy, lumbar region: Secondary | ICD-10-CM | POA: Diagnosis not present

## 2023-02-05 DIAGNOSIS — M19049 Primary osteoarthritis, unspecified hand: Secondary | ICD-10-CM | POA: Diagnosis not present

## 2023-02-05 DIAGNOSIS — G894 Chronic pain syndrome: Secondary | ICD-10-CM | POA: Diagnosis not present

## 2023-03-11 DIAGNOSIS — M810 Age-related osteoporosis without current pathological fracture: Secondary | ICD-10-CM | POA: Diagnosis not present

## 2023-03-12 DIAGNOSIS — M47816 Spondylosis without myelopathy or radiculopathy, lumbar region: Secondary | ICD-10-CM | POA: Diagnosis not present

## 2023-03-12 DIAGNOSIS — G894 Chronic pain syndrome: Secondary | ICD-10-CM | POA: Diagnosis not present

## 2023-03-12 DIAGNOSIS — M19049 Primary osteoarthritis, unspecified hand: Secondary | ICD-10-CM | POA: Diagnosis not present

## 2023-03-12 DIAGNOSIS — M47812 Spondylosis without myelopathy or radiculopathy, cervical region: Secondary | ICD-10-CM | POA: Diagnosis not present

## 2023-03-14 DIAGNOSIS — M19071 Primary osteoarthritis, right ankle and foot: Secondary | ICD-10-CM | POA: Diagnosis not present

## 2023-03-17 ENCOUNTER — Other Ambulatory Visit: Payer: Self-pay | Admitting: Orthopaedic Surgery

## 2023-03-17 DIAGNOSIS — M79671 Pain in right foot: Secondary | ICD-10-CM

## 2023-03-18 DIAGNOSIS — R69 Illness, unspecified: Secondary | ICD-10-CM | POA: Diagnosis not present

## 2023-04-03 ENCOUNTER — Ambulatory Visit
Admission: RE | Admit: 2023-04-03 | Discharge: 2023-04-03 | Disposition: A | Discharge status: Home / Self Care | Payer: Medicare HMO | Source: Ambulatory Visit | Attending: Orthopaedic Surgery | Admitting: Orthopaedic Surgery

## 2023-04-03 DIAGNOSIS — M79671 Pain in right foot: Secondary | ICD-10-CM

## 2023-04-03 DIAGNOSIS — M19071 Primary osteoarthritis, right ankle and foot: Secondary | ICD-10-CM | POA: Diagnosis not present

## 2023-04-09 DIAGNOSIS — L821 Other seborrheic keratosis: Secondary | ICD-10-CM | POA: Diagnosis not present

## 2023-04-09 DIAGNOSIS — D2272 Melanocytic nevi of left lower limb, including hip: Secondary | ICD-10-CM | POA: Diagnosis not present

## 2023-04-09 DIAGNOSIS — D2261 Melanocytic nevi of right upper limb, including shoulder: Secondary | ICD-10-CM | POA: Diagnosis not present

## 2023-04-09 DIAGNOSIS — D225 Melanocytic nevi of trunk: Secondary | ICD-10-CM | POA: Diagnosis not present

## 2023-04-09 DIAGNOSIS — Z85828 Personal history of other malignant neoplasm of skin: Secondary | ICD-10-CM | POA: Diagnosis not present

## 2023-04-09 DIAGNOSIS — D485 Neoplasm of uncertain behavior of skin: Secondary | ICD-10-CM | POA: Diagnosis not present

## 2023-04-09 DIAGNOSIS — L578 Other skin changes due to chronic exposure to nonionizing radiation: Secondary | ICD-10-CM | POA: Diagnosis not present

## 2023-04-11 DIAGNOSIS — M19071 Primary osteoarthritis, right ankle and foot: Secondary | ICD-10-CM | POA: Diagnosis not present

## 2023-04-11 DIAGNOSIS — M2041 Other hammer toe(s) (acquired), right foot: Secondary | ICD-10-CM | POA: Diagnosis not present

## 2023-04-14 DIAGNOSIS — G894 Chronic pain syndrome: Secondary | ICD-10-CM | POA: Diagnosis not present

## 2023-04-14 DIAGNOSIS — M19049 Primary osteoarthritis, unspecified hand: Secondary | ICD-10-CM | POA: Diagnosis not present

## 2023-04-14 DIAGNOSIS — R69 Illness, unspecified: Secondary | ICD-10-CM | POA: Diagnosis not present

## 2023-04-14 DIAGNOSIS — M47812 Spondylosis without myelopathy or radiculopathy, cervical region: Secondary | ICD-10-CM | POA: Diagnosis not present

## 2023-04-14 DIAGNOSIS — M47816 Spondylosis without myelopathy or radiculopathy, lumbar region: Secondary | ICD-10-CM | POA: Diagnosis not present

## 2023-05-05 DIAGNOSIS — Z1231 Encounter for screening mammogram for malignant neoplasm of breast: Secondary | ICD-10-CM | POA: Diagnosis not present

## 2023-05-12 DIAGNOSIS — M19049 Primary osteoarthritis, unspecified hand: Secondary | ICD-10-CM | POA: Diagnosis not present

## 2023-05-12 DIAGNOSIS — M47812 Spondylosis without myelopathy or radiculopathy, cervical region: Secondary | ICD-10-CM | POA: Diagnosis not present

## 2023-05-12 DIAGNOSIS — G894 Chronic pain syndrome: Secondary | ICD-10-CM | POA: Diagnosis not present

## 2023-05-12 DIAGNOSIS — M47816 Spondylosis without myelopathy or radiculopathy, lumbar region: Secondary | ICD-10-CM | POA: Diagnosis not present

## 2023-05-13 DIAGNOSIS — C44612 Basal cell carcinoma of skin of right upper limb, including shoulder: Secondary | ICD-10-CM | POA: Diagnosis not present

## 2023-05-15 DIAGNOSIS — R69 Illness, unspecified: Secondary | ICD-10-CM | POA: Diagnosis not present

## 2023-06-04 DIAGNOSIS — H52223 Regular astigmatism, bilateral: Secondary | ICD-10-CM | POA: Diagnosis not present

## 2023-06-04 DIAGNOSIS — Z0100 Encounter for examination of eyes and vision without abnormal findings: Secondary | ICD-10-CM | POA: Diagnosis not present

## 2023-06-10 DIAGNOSIS — M47812 Spondylosis without myelopathy or radiculopathy, cervical region: Secondary | ICD-10-CM | POA: Diagnosis not present

## 2023-06-10 DIAGNOSIS — M19049 Primary osteoarthritis, unspecified hand: Secondary | ICD-10-CM | POA: Diagnosis not present

## 2023-06-10 DIAGNOSIS — G894 Chronic pain syndrome: Secondary | ICD-10-CM | POA: Diagnosis not present

## 2023-06-10 DIAGNOSIS — M47816 Spondylosis without myelopathy or radiculopathy, lumbar region: Secondary | ICD-10-CM | POA: Diagnosis not present

## 2023-06-17 DIAGNOSIS — R69 Illness, unspecified: Secondary | ICD-10-CM | POA: Diagnosis not present

## 2023-07-07 DIAGNOSIS — Z6831 Body mass index (BMI) 31.0-31.9, adult: Secondary | ICD-10-CM | POA: Diagnosis not present

## 2023-07-07 DIAGNOSIS — M180 Bilateral primary osteoarthritis of first carpometacarpal joints: Secondary | ICD-10-CM | POA: Diagnosis not present

## 2023-07-07 DIAGNOSIS — M5136 Other intervertebral disc degeneration, lumbar region: Secondary | ICD-10-CM | POA: Diagnosis not present

## 2023-07-07 DIAGNOSIS — M79671 Pain in right foot: Secondary | ICD-10-CM | POA: Diagnosis not present

## 2023-07-07 DIAGNOSIS — M79672 Pain in left foot: Secondary | ICD-10-CM | POA: Diagnosis not present

## 2023-07-07 DIAGNOSIS — E669 Obesity, unspecified: Secondary | ICD-10-CM | POA: Diagnosis not present

## 2023-07-07 DIAGNOSIS — M1991 Primary osteoarthritis, unspecified site: Secondary | ICD-10-CM | POA: Diagnosis not present

## 2023-07-07 DIAGNOSIS — M65332 Trigger finger, left middle finger: Secondary | ICD-10-CM | POA: Diagnosis not present

## 2023-07-07 DIAGNOSIS — M25562 Pain in left knee: Secondary | ICD-10-CM | POA: Diagnosis not present

## 2023-07-15 DIAGNOSIS — R69 Illness, unspecified: Secondary | ICD-10-CM | POA: Diagnosis not present

## 2023-07-18 DIAGNOSIS — I10 Essential (primary) hypertension: Secondary | ICD-10-CM | POA: Diagnosis not present

## 2023-07-18 DIAGNOSIS — E559 Vitamin D deficiency, unspecified: Secondary | ICD-10-CM | POA: Diagnosis not present

## 2023-07-18 DIAGNOSIS — E042 Nontoxic multinodular goiter: Secondary | ICD-10-CM | POA: Diagnosis not present

## 2023-07-23 DIAGNOSIS — Z0000 Encounter for general adult medical examination without abnormal findings: Secondary | ICD-10-CM | POA: Diagnosis not present

## 2023-07-23 DIAGNOSIS — R9389 Abnormal findings on diagnostic imaging of other specified body structures: Secondary | ICD-10-CM | POA: Diagnosis not present

## 2023-07-23 DIAGNOSIS — I10 Essential (primary) hypertension: Secondary | ICD-10-CM | POA: Diagnosis not present

## 2023-07-23 DIAGNOSIS — E041 Nontoxic single thyroid nodule: Secondary | ICD-10-CM | POA: Diagnosis not present

## 2023-07-23 DIAGNOSIS — E042 Nontoxic multinodular goiter: Secondary | ICD-10-CM | POA: Diagnosis not present

## 2023-07-23 DIAGNOSIS — I739 Peripheral vascular disease, unspecified: Secondary | ICD-10-CM | POA: Diagnosis not present

## 2023-07-23 DIAGNOSIS — F339 Major depressive disorder, recurrent, unspecified: Secondary | ICD-10-CM | POA: Diagnosis not present

## 2023-07-23 DIAGNOSIS — M8000XD Age-related osteoporosis with current pathological fracture, unspecified site, subsequent encounter for fracture with routine healing: Secondary | ICD-10-CM | POA: Diagnosis not present

## 2023-07-23 DIAGNOSIS — E559 Vitamin D deficiency, unspecified: Secondary | ICD-10-CM | POA: Diagnosis not present

## 2023-07-23 DIAGNOSIS — R748 Abnormal levels of other serum enzymes: Secondary | ICD-10-CM | POA: Diagnosis not present

## 2023-07-23 DIAGNOSIS — M810 Age-related osteoporosis without current pathological fracture: Secondary | ICD-10-CM | POA: Diagnosis not present

## 2023-07-24 ENCOUNTER — Other Ambulatory Visit: Payer: Self-pay | Admitting: Medical Genetics

## 2023-07-24 DIAGNOSIS — Z006 Encounter for examination for normal comparison and control in clinical research program: Secondary | ICD-10-CM

## 2023-08-01 DIAGNOSIS — Z96652 Presence of left artificial knee joint: Secondary | ICD-10-CM | POA: Diagnosis not present

## 2023-08-01 DIAGNOSIS — M25562 Pain in left knee: Secondary | ICD-10-CM | POA: Diagnosis not present

## 2023-08-07 ENCOUNTER — Other Ambulatory Visit: Payer: Self-pay | Admitting: Nurse Practitioner

## 2023-08-07 DIAGNOSIS — E041 Nontoxic single thyroid nodule: Secondary | ICD-10-CM | POA: Diagnosis not present

## 2023-08-11 ENCOUNTER — Ambulatory Visit
Admission: RE | Admit: 2023-08-11 | Discharge: 2023-08-11 | Disposition: A | Discharge status: Home / Self Care | Payer: Medicare HMO | Source: Ambulatory Visit | Attending: Nurse Practitioner | Admitting: Nurse Practitioner

## 2023-08-11 DIAGNOSIS — E041 Nontoxic single thyroid nodule: Secondary | ICD-10-CM

## 2023-08-11 DIAGNOSIS — E042 Nontoxic multinodular goiter: Secondary | ICD-10-CM | POA: Diagnosis not present

## 2023-08-12 DIAGNOSIS — G894 Chronic pain syndrome: Secondary | ICD-10-CM | POA: Diagnosis not present

## 2023-08-12 DIAGNOSIS — M47816 Spondylosis without myelopathy or radiculopathy, lumbar region: Secondary | ICD-10-CM | POA: Diagnosis not present

## 2023-08-12 DIAGNOSIS — M47812 Spondylosis without myelopathy or radiculopathy, cervical region: Secondary | ICD-10-CM | POA: Diagnosis not present

## 2023-08-12 DIAGNOSIS — M19049 Primary osteoarthritis, unspecified hand: Secondary | ICD-10-CM | POA: Diagnosis not present

## 2023-08-13 DIAGNOSIS — S62662B Nondisplaced fracture of distal phalanx of right middle finger, initial encounter for open fracture: Secondary | ICD-10-CM | POA: Diagnosis not present

## 2023-08-13 DIAGNOSIS — W230XXA Caught, crushed, jammed, or pinched between moving objects, initial encounter: Secondary | ICD-10-CM | POA: Diagnosis not present

## 2023-08-13 DIAGNOSIS — S61312A Laceration without foreign body of right middle finger with damage to nail, initial encounter: Secondary | ICD-10-CM | POA: Diagnosis not present

## 2023-08-13 DIAGNOSIS — S61212A Laceration without foreign body of right middle finger without damage to nail, initial encounter: Secondary | ICD-10-CM | POA: Diagnosis not present

## 2023-08-13 DIAGNOSIS — Z23 Encounter for immunization: Secondary | ICD-10-CM | POA: Diagnosis not present

## 2023-08-15 DIAGNOSIS — R69 Illness, unspecified: Secondary | ICD-10-CM | POA: Diagnosis not present

## 2023-08-18 DIAGNOSIS — Z01419 Encounter for gynecological examination (general) (routine) without abnormal findings: Secondary | ICD-10-CM | POA: Diagnosis not present

## 2023-08-18 DIAGNOSIS — S61312A Laceration without foreign body of right middle finger with damage to nail, initial encounter: Secondary | ICD-10-CM | POA: Diagnosis not present

## 2023-08-18 DIAGNOSIS — S61319A Laceration without foreign body of unspecified finger with damage to nail, initial encounter: Secondary | ICD-10-CM | POA: Diagnosis not present

## 2023-09-01 DIAGNOSIS — S62662D Nondisplaced fracture of distal phalanx of right middle finger, subsequent encounter for fracture with routine healing: Secondary | ICD-10-CM | POA: Diagnosis not present

## 2023-09-04 DIAGNOSIS — R635 Abnormal weight gain: Secondary | ICD-10-CM | POA: Diagnosis not present

## 2023-09-04 DIAGNOSIS — N951 Menopausal and female climacteric states: Secondary | ICD-10-CM | POA: Diagnosis not present

## 2023-09-04 DIAGNOSIS — Z6830 Body mass index (BMI) 30.0-30.9, adult: Secondary | ICD-10-CM | POA: Diagnosis not present

## 2023-09-14 DIAGNOSIS — R69 Illness, unspecified: Secondary | ICD-10-CM | POA: Diagnosis not present

## 2023-09-15 DIAGNOSIS — I10 Essential (primary) hypertension: Secondary | ICD-10-CM | POA: Diagnosis not present

## 2023-09-15 DIAGNOSIS — I739 Peripheral vascular disease, unspecified: Secondary | ICD-10-CM | POA: Diagnosis not present

## 2023-09-15 DIAGNOSIS — E860 Dehydration: Secondary | ICD-10-CM | POA: Diagnosis not present

## 2023-09-15 DIAGNOSIS — Z1339 Encounter for screening examination for other mental health and behavioral disorders: Secondary | ICD-10-CM | POA: Diagnosis not present

## 2023-09-15 DIAGNOSIS — F339 Major depressive disorder, recurrent, unspecified: Secondary | ICD-10-CM | POA: Diagnosis not present

## 2023-09-15 DIAGNOSIS — E041 Nontoxic single thyroid nodule: Secondary | ICD-10-CM | POA: Diagnosis not present

## 2023-09-15 DIAGNOSIS — E669 Obesity, unspecified: Secondary | ICD-10-CM | POA: Diagnosis not present

## 2023-09-15 DIAGNOSIS — G4700 Insomnia, unspecified: Secondary | ICD-10-CM | POA: Diagnosis not present

## 2023-09-15 DIAGNOSIS — N951 Menopausal and female climacteric states: Secondary | ICD-10-CM | POA: Diagnosis not present

## 2023-09-15 DIAGNOSIS — Z1331 Encounter for screening for depression: Secondary | ICD-10-CM | POA: Diagnosis not present

## 2023-09-15 DIAGNOSIS — N3941 Urge incontinence: Secondary | ICD-10-CM | POA: Diagnosis not present

## 2023-09-15 DIAGNOSIS — G2581 Restless legs syndrome: Secondary | ICD-10-CM | POA: Diagnosis not present

## 2023-09-16 DIAGNOSIS — R69 Illness, unspecified: Secondary | ICD-10-CM | POA: Diagnosis not present

## 2023-09-17 DIAGNOSIS — M810 Age-related osteoporosis without current pathological fracture: Secondary | ICD-10-CM | POA: Diagnosis not present

## 2023-10-06 ENCOUNTER — Other Ambulatory Visit: Payer: Self-pay | Admitting: Family Medicine

## 2023-10-06 DIAGNOSIS — M47816 Spondylosis without myelopathy or radiculopathy, lumbar region: Secondary | ICD-10-CM | POA: Diagnosis not present

## 2023-10-06 DIAGNOSIS — M19049 Primary osteoarthritis, unspecified hand: Secondary | ICD-10-CM | POA: Diagnosis not present

## 2023-10-06 DIAGNOSIS — M47812 Spondylosis without myelopathy or radiculopathy, cervical region: Secondary | ICD-10-CM | POA: Diagnosis not present

## 2023-10-06 DIAGNOSIS — Z006 Encounter for examination for normal comparison and control in clinical research program: Secondary | ICD-10-CM

## 2023-10-06 DIAGNOSIS — G894 Chronic pain syndrome: Secondary | ICD-10-CM | POA: Diagnosis not present

## 2023-10-10 ENCOUNTER — Ambulatory Visit
Admission: RE | Admit: 2023-10-10 | Discharge: 2023-10-10 | Disposition: A | Discharge status: Home / Self Care | Payer: Medicare HMO | Source: Ambulatory Visit | Attending: Emergency Medicine | Admitting: Emergency Medicine

## 2023-10-10 DIAGNOSIS — I700 Atherosclerosis of aorta: Secondary | ICD-10-CM | POA: Diagnosis not present

## 2023-10-10 DIAGNOSIS — R918 Other nonspecific abnormal finding of lung field: Secondary | ICD-10-CM | POA: Diagnosis not present

## 2023-10-10 DIAGNOSIS — I288 Other diseases of pulmonary vessels: Secondary | ICD-10-CM | POA: Diagnosis not present

## 2023-10-10 DIAGNOSIS — R9389 Abnormal findings on diagnostic imaging of other specified body structures: Secondary | ICD-10-CM

## 2023-10-23 ENCOUNTER — Encounter: Payer: Self-pay | Admitting: Emergency Medicine

## 2023-10-23 ENCOUNTER — Ambulatory Visit (INDEPENDENT_AMBULATORY_CARE_PROVIDER_SITE_OTHER): Payer: Medicare HMO | Admitting: Emergency Medicine

## 2023-10-23 VITALS — BP 118/72 | HR 88 | Temp 98.3°F | Ht 63.0 in | Wt 180.8 lb

## 2023-10-23 DIAGNOSIS — R9389 Abnormal findings on diagnostic imaging of other specified body structures: Secondary | ICD-10-CM | POA: Diagnosis not present

## 2023-10-23 NOTE — Progress Notes (Signed)
 [name]

## 2023-10-23 NOTE — Progress Notes (Signed)
 Subjective:    Patient ID: [name], female    DOB: Feb 10, 1951, 73 y.o.   MRN: [id number]  HPI  ROV 11/06/22 --follow-up visit for 73 year old woman with a minimal prior tobacco history, hypertension, OSA on CPAP.  I saw her 1 year ago for an abnormal CT scan of the chest..  There were bilateral groundglass changes without any focal nodularity of unclear cause.  She was not having any symptoms so we decided to repeat her CT chest after 1 year as below.  CT chest 10/01/2022 reviewed by me shows stable scattered groundglass changes left greater than right and peribronchovascular distribution with some mild associated bronchiectasis, possible very low-grade inflammatory or infectious process..  Also a left thyroid  nodule slightly increased from prior for which she underwent with thyroid  ultrasound 10/30/2022  Thyroid  ultrasound 10/30/2022 showed a left inferior thyroid  nodule 1.8 cm that needs ultrasound surveillance.   ROV 10/23/2023 --follow-up visit 73 year old woman with minimal tobacco history, OSA on CPAP, hypertension.  I been following her for bilateral groundglass changes on CT scan of the chest without any focal nodularity.  Unclear cause but in absence of any symptoms we have been following.  Her groundglass persisted on her last visit about 1 year ago.  CT scan of the chest 10/10/2023 reviewed by me show a nodular thyroid  with individual nodules measuring up to 1.5 cm, no pathologically enlarged mediastinal or axillary nodes, mild perihilar predominant peribronchovascular nodularity with a waxing waning appearance that dates back to 09/2021, possibly more prominent than 09/2022 but overall unchanged.  There is a low-attenuation 11 mm right adrenal nodule   Review of Systems As per HPI  Past Medical History:  Diagnosis Date   Anxiety    Arthritis KNEES AND HANDS   Bladder tumor    benign   Blood transfusion    DDD (degenerative disc disease)    DDD (degenerative disc disease),  lumbar    Hypertension    Normal cardiac stress test 08-10-2004   OSA on CPAP    uses cpap, pt does not know settings   Urgency of urination      Family History  Problem Relation Age of Onset   Cancer Mother        colon   Hypertension Mother    Heart murmur Mother    Cancer Father        lung   Hypertension Father    Diabetes Father    Cancer Brother        rectal     Social History   Socioeconomic History   Marital status: Married    Spouse name: Rosalynn   Number of children: 2   Years of education: 13   Highest education level: Not on file  Occupational History    Comment: [employer]  Tobacco Use   Smoking status: Former    Current packs/day: 0.00    Types: Cigarettes    Quit date: 06/25/1980    Years since quitting: 43.3   Smokeless tobacco: Never  Substance and Sexual Activity   Alcohol use: Yes    Alcohol/week: 1.0 standard drink of alcohol    Types: 1 Glasses of wine per week    Comment: occasional   Drug use: No   Sexual activity: Yes    Birth control/protection: Post-menopausal  Other Topics Concern   Not on file  Social History Narrative   Lives at home with spouse   Caffeine - coffee, 1 cup daily   Social Drivers  of Health   Financial Resource Strain: Not on file  Food Insecurity: Low Risk  (08/13/2023)   Received from [facility]   Hunger Vital Sign    Worried About Running Out of Food in the Last Year: Never true    Ran Out of Food in the Last Year: Never true  Transportation Needs: No Transportation Needs (08/13/2023)   Received from [facility]    In the past 12 months, has lack of reliable transportation kept you from medical appointments, meetings, work or from getting things needed for daily living? : No  Physical Activity: Not on file  Stress: Not on file  Social Connections: Not on file  Intimate Partner Violence: Not on file     Allergies  Allergen Reactions   Vancomycin  Rash     Outpatient Medications Prior  to Visit  Medication Sig Dispense Refill   amLODipine  (NORVASC ) 10 MG tablet Take 1 tablet by mouth daily.     celecoxib  (CELEBREX ) 200 MG capsule Take 1 capsule (200 mg total) by mouth every 12 (twelve) hours. 60 capsule 0   cholecalciferol (VITAMIN D) 1000 units tablet Take 1,000 Units by mouth every morning.     cyclobenzaprine  (FLEXERIL ) 10 MG tablet Take 1 tablet by mouth every 8 (eight) hours as needed.     diclofenac Sodium (VOLTAREN) 1 % GEL Apply 4 g topically 3 (three) times daily as needed.     HYDROcodone -acetaminophen  (NORCO/VICODIN) 5-325 MG tablet Take by mouth as needed.     Multiple Vitamin (MULTIVITAMIN WITH MINERALS) TABS tablet Take 1 tablet by mouth every morning.     rOPINIRole (REQUIP) 0.5 MG tablet Take 0.5 mg by mouth 2 (two) times daily.     rosuvastatin (CRESTOR) 5 MG tablet Take 5 mg by mouth daily.     valsartan -hydrochlorothiazide  (DIOVAN -HCT) 160-12.5 MG tablet Take 1 tablet by mouth daily.     zolpidem  (AMBIEN  CR) 12.5 MG CR tablet Take 12.5 mg by mouth at bedtime as needed for sleep.      ALPRAZolam  (XANAX ) 0.25 MG tablet Take 0.25 mg by mouth 2 (two) times daily as needed. (Patient not taking: Reported on 10/23/2023)     amLODIPine -Valsartan -HCTZ 10-320-25 MG TABS Take 1 tablet by mouth every morning. (Patient not taking: Reported on 10/23/2023)     estrogens , conjugated, (PREMARIN ) 0.45 MG tablet Take 0.45 mg by mouth every morning. Reported on 01/01/2016 (Patient not taking: Reported on 10/23/2023)     sertraline  (ZOLOFT ) 50 MG tablet Take 50 mg by mouth every morning. (Patient not taking: Reported on 10/23/2023)     venlafaxine XR (EFFEXOR-XR) 75 MG 24 hr capsule venlafaxine ER 75 mg capsule,extended release 24 hr  TAKE 1 CAPSULE BY MOUTH EVERY DAY WITH FOOD (Patient not taking: Reported on 10/23/2023)     No facility-administered medications prior to visit.         Objective:   Physical Exam Vitals:   10/23/23 1558  BP: 118/72  Pulse: 88  Temp: 98.3 F (36.8  C)  TempSrc: Oral  SpO2: 96%  Weight: 180 lb 12.8 oz (82 kg)  Height: 5' 3 (1.6 m)   Gen: Pleasant, well-nourished, in no distress,  normal affect  ENT: No lesions,  mouth clear,  oropharynx clear, no postnasal drip  Neck: No JVD, no stridor  Lungs: No use of accessory muscles, no crackles or wheezing on normal respiration, no wheeze on forced expiration  Cardiovascular: RRR, heart sounds normal, no murmur or gallops, no  peripheral edema  Musculoskeletal: No deformities, no cyanosis or clubbing  Neuro: alert, awake, non focal  Skin: Warm, no lesions or rash      Assessment & Plan:   Abnormal CT of the chest Bilateral peribronchovascular, perihilar groundglass infiltrate, more noticeable on the left and on the right on this scan.  Changes have been somewhat waxing and waning but overall no significant change going back to 2022.  She remains asymptomatic.  Unclear cause but she does not appear to have any clinical manifestations.  Again we talked about the pros and cons of bronchoscopy.  For now we will defer, continue to follow.  Plan for repeat CT scan in 1 year unless there is a clinical change.  If she does have evolving symptoms then we will repeat her imaging sooner, again consider bronchoscopy for culture, cell count, biopsies.    Lamar Chris, MD, PhD 10/23/2023, 4:57 PM [facility] [phone] or if no answer before 7:00PM call [phone] For any issues after 7:00PM please call eLink [phone]

## 2023-10-23 NOTE — Patient Instructions (Signed)
 We reviewed her CT scan of the chest today. There is some persistent very subtle inflammatory change present in both lungs of unclear significance We will plan to repeat your CT scan of the chest without contrast in December 2025 Follow Dr. Shelah in January 2025 to review that scan Please call if you develop any new symptoms especially respiratory symptoms, weight loss, fevers, sweats, etc.

## 2023-10-23 NOTE — Assessment & Plan Note (Signed)
 Bilateral peribronchovascular, perihilar groundglass infiltrate, more noticeable on the left and on the right on this scan.  Changes have been somewhat waxing and waning but overall no significant change going back to 2022.  She remains asymptomatic.  Unclear cause but she does not appear to have any clinical manifestations.  Again we talked about the pros and cons of bronchoscopy.  For now we will defer, continue to follow.  Plan for repeat CT scan in 1 year unless there is a clinical change.  If she does have evolving symptoms then we will repeat her imaging sooner, again consider bronchoscopy for culture, cell count, biopsies.

## 2023-10-29 ENCOUNTER — Other Ambulatory Visit: Payer: Self-pay | Admitting: Family Medicine

## 2023-10-29 ENCOUNTER — Ambulatory Visit
Admission: RE | Admit: 2023-10-29 | Discharge: 2023-10-29 | Disposition: A | Discharge status: Home / Self Care | Payer: Medicare HMO | Source: Ambulatory Visit | Attending: Family Medicine | Admitting: Family Medicine

## 2023-10-29 DIAGNOSIS — Z006 Encounter for examination for normal comparison and control in clinical research program: Secondary | ICD-10-CM

## 2023-10-29 DIAGNOSIS — R5381 Other malaise: Secondary | ICD-10-CM

## 2023-10-30 ENCOUNTER — Ambulatory Visit
Admission: RE | Admit: 2023-10-30 | Discharge: 2023-10-30 | Disposition: A | Discharge status: Home / Self Care | Payer: No Typology Code available for payment source | Source: Ambulatory Visit | Attending: Family Medicine | Admitting: Family Medicine

## 2023-10-30 DIAGNOSIS — R5381 Other malaise: Secondary | ICD-10-CM

## 2023-10-30 DIAGNOSIS — Z006 Encounter for examination for normal comparison and control in clinical research program: Secondary | ICD-10-CM

## 2023-12-02 DIAGNOSIS — G894 Chronic pain syndrome: Secondary | ICD-10-CM | POA: Diagnosis not present

## 2023-12-02 DIAGNOSIS — M47812 Spondylosis without myelopathy or radiculopathy, cervical region: Secondary | ICD-10-CM | POA: Diagnosis not present

## 2023-12-02 DIAGNOSIS — M47816 Spondylosis without myelopathy or radiculopathy, lumbar region: Secondary | ICD-10-CM | POA: Diagnosis not present

## 2023-12-02 DIAGNOSIS — M19049 Primary osteoarthritis, unspecified hand: Secondary | ICD-10-CM | POA: Diagnosis not present

## 2023-12-11 ENCOUNTER — Other Ambulatory Visit: Payer: Self-pay | Admitting: Nurse Practitioner

## 2023-12-11 DIAGNOSIS — E042 Nontoxic multinodular goiter: Secondary | ICD-10-CM

## 2023-12-19 DIAGNOSIS — E042 Nontoxic multinodular goiter: Secondary | ICD-10-CM | POA: Diagnosis not present

## 2023-12-19 DIAGNOSIS — I10 Essential (primary) hypertension: Secondary | ICD-10-CM | POA: Diagnosis not present

## 2024-01-05 DIAGNOSIS — M180 Bilateral primary osteoarthritis of first carpometacarpal joints: Secondary | ICD-10-CM | POA: Diagnosis not present

## 2024-01-05 DIAGNOSIS — M1991 Primary osteoarthritis, unspecified site: Secondary | ICD-10-CM | POA: Diagnosis not present

## 2024-01-05 DIAGNOSIS — M65332 Trigger finger, left middle finger: Secondary | ICD-10-CM | POA: Diagnosis not present

## 2024-01-05 DIAGNOSIS — E663 Overweight: Secondary | ICD-10-CM | POA: Diagnosis not present

## 2024-01-05 DIAGNOSIS — M25562 Pain in left knee: Secondary | ICD-10-CM | POA: Diagnosis not present

## 2024-01-05 DIAGNOSIS — M79672 Pain in left foot: Secondary | ICD-10-CM | POA: Diagnosis not present

## 2024-01-05 DIAGNOSIS — Z6828 Body mass index (BMI) 28.0-28.9, adult: Secondary | ICD-10-CM | POA: Diagnosis not present

## 2024-01-05 DIAGNOSIS — M79671 Pain in right foot: Secondary | ICD-10-CM | POA: Diagnosis not present

## 2024-01-06 DIAGNOSIS — R2232 Localized swelling, mass and lump, left upper limb: Secondary | ICD-10-CM | POA: Diagnosis not present

## 2024-01-07 ENCOUNTER — Other Ambulatory Visit

## 2024-01-07 DIAGNOSIS — Z006 Encounter for examination for normal comparison and control in clinical research program: Secondary | ICD-10-CM

## 2024-01-15 ENCOUNTER — Other Ambulatory Visit: Payer: Self-pay

## 2024-01-15 DIAGNOSIS — M67442 Ganglion, left hand: Secondary | ICD-10-CM | POA: Diagnosis not present

## 2024-01-15 DIAGNOSIS — M71342 Other bursal cyst, left hand: Secondary | ICD-10-CM | POA: Diagnosis not present

## 2024-01-16 LAB — GENECONNECT MOLECULAR SCREEN: Genetic Analysis Overall Interpretation: NEGATIVE

## 2024-01-19 LAB — SURGICAL PATHOLOGY

## 2024-01-29 DIAGNOSIS — M19049 Primary osteoarthritis, unspecified hand: Secondary | ICD-10-CM | POA: Diagnosis not present

## 2024-01-29 DIAGNOSIS — M47816 Spondylosis without myelopathy or radiculopathy, lumbar region: Secondary | ICD-10-CM | POA: Diagnosis not present

## 2024-01-29 DIAGNOSIS — G894 Chronic pain syndrome: Secondary | ICD-10-CM | POA: Diagnosis not present

## 2024-01-29 DIAGNOSIS — Z79891 Long term (current) use of opiate analgesic: Secondary | ICD-10-CM | POA: Diagnosis not present

## 2024-01-29 DIAGNOSIS — M47812 Spondylosis without myelopathy or radiculopathy, cervical region: Secondary | ICD-10-CM | POA: Diagnosis not present

## 2024-02-11 DIAGNOSIS — M25642 Stiffness of left hand, not elsewhere classified: Secondary | ICD-10-CM | POA: Diagnosis not present

## 2024-03-24 DIAGNOSIS — M810 Age-related osteoporosis without current pathological fracture: Secondary | ICD-10-CM | POA: Diagnosis not present

## 2024-04-01 DIAGNOSIS — M47812 Spondylosis without myelopathy or radiculopathy, cervical region: Secondary | ICD-10-CM | POA: Diagnosis not present

## 2024-04-01 DIAGNOSIS — G894 Chronic pain syndrome: Secondary | ICD-10-CM | POA: Diagnosis not present

## 2024-04-01 DIAGNOSIS — M47816 Spondylosis without myelopathy or radiculopathy, lumbar region: Secondary | ICD-10-CM | POA: Diagnosis not present

## 2024-04-01 DIAGNOSIS — M19049 Primary osteoarthritis, unspecified hand: Secondary | ICD-10-CM | POA: Diagnosis not present

## 2024-04-22 DIAGNOSIS — D485 Neoplasm of uncertain behavior of skin: Secondary | ICD-10-CM | POA: Diagnosis not present

## 2024-04-22 DIAGNOSIS — D225 Melanocytic nevi of trunk: Secondary | ICD-10-CM | POA: Diagnosis not present

## 2024-04-22 DIAGNOSIS — D2261 Melanocytic nevi of right upper limb, including shoulder: Secondary | ICD-10-CM | POA: Diagnosis not present

## 2024-04-22 DIAGNOSIS — L821 Other seborrheic keratosis: Secondary | ICD-10-CM | POA: Diagnosis not present

## 2024-04-22 DIAGNOSIS — Z85828 Personal history of other malignant neoplasm of skin: Secondary | ICD-10-CM | POA: Diagnosis not present

## 2024-04-22 DIAGNOSIS — L578 Other skin changes due to chronic exposure to nonionizing radiation: Secondary | ICD-10-CM | POA: Diagnosis not present

## 2024-04-22 DIAGNOSIS — L237 Allergic contact dermatitis due to plants, except food: Secondary | ICD-10-CM | POA: Diagnosis not present

## 2024-04-22 DIAGNOSIS — D2272 Melanocytic nevi of left lower limb, including hip: Secondary | ICD-10-CM | POA: Diagnosis not present

## 2024-05-10 DIAGNOSIS — Z1231 Encounter for screening mammogram for malignant neoplasm of breast: Secondary | ICD-10-CM | POA: Diagnosis not present

## 2024-05-27 DIAGNOSIS — M47816 Spondylosis without myelopathy or radiculopathy, lumbar region: Secondary | ICD-10-CM | POA: Diagnosis not present

## 2024-05-27 DIAGNOSIS — G894 Chronic pain syndrome: Secondary | ICD-10-CM | POA: Diagnosis not present

## 2024-05-27 DIAGNOSIS — M47812 Spondylosis without myelopathy or radiculopathy, cervical region: Secondary | ICD-10-CM | POA: Diagnosis not present

## 2024-05-27 DIAGNOSIS — M19049 Primary osteoarthritis, unspecified hand: Secondary | ICD-10-CM | POA: Diagnosis not present

## 2024-07-05 DIAGNOSIS — Z471 Aftercare following joint replacement surgery: Secondary | ICD-10-CM | POA: Diagnosis not present

## 2024-07-05 DIAGNOSIS — Z96653 Presence of artificial knee joint, bilateral: Secondary | ICD-10-CM | POA: Diagnosis not present

## 2024-07-08 DIAGNOSIS — M47816 Spondylosis without myelopathy or radiculopathy, lumbar region: Secondary | ICD-10-CM | POA: Diagnosis not present

## 2024-07-08 DIAGNOSIS — M19049 Primary osteoarthritis, unspecified hand: Secondary | ICD-10-CM | POA: Diagnosis not present

## 2024-07-08 DIAGNOSIS — G894 Chronic pain syndrome: Secondary | ICD-10-CM | POA: Diagnosis not present

## 2024-07-08 DIAGNOSIS — M47812 Spondylosis without myelopathy or radiculopathy, cervical region: Secondary | ICD-10-CM | POA: Diagnosis not present

## 2024-07-13 DIAGNOSIS — H52223 Regular astigmatism, bilateral: Secondary | ICD-10-CM | POA: Diagnosis not present

## 2024-07-22 DIAGNOSIS — E041 Nontoxic single thyroid nodule: Secondary | ICD-10-CM | POA: Diagnosis not present

## 2024-07-22 DIAGNOSIS — I10 Essential (primary) hypertension: Secondary | ICD-10-CM | POA: Diagnosis not present

## 2024-07-22 DIAGNOSIS — E559 Vitamin D deficiency, unspecified: Secondary | ICD-10-CM | POA: Diagnosis not present

## 2024-07-27 ENCOUNTER — Other Ambulatory Visit (HOSPITAL_COMMUNITY): Payer: Self-pay | Admitting: Orthopedic Surgery

## 2024-07-27 DIAGNOSIS — Z96652 Presence of left artificial knee joint: Secondary | ICD-10-CM

## 2024-08-12 ENCOUNTER — Encounter (HOSPITAL_COMMUNITY): Payer: Self-pay

## 2024-08-12 ENCOUNTER — Encounter (HOSPITAL_COMMUNITY)
Admission: RE | Admit: 2024-08-12 | Discharge: 2024-08-12 | Disposition: A | Discharge status: Home / Self Care | Source: Ambulatory Visit | Attending: Orthopedic Surgery | Admitting: Orthopedic Surgery

## 2024-08-12 DIAGNOSIS — Z96652 Presence of left artificial knee joint: Secondary | ICD-10-CM | POA: Diagnosis not present

## 2024-08-12 MED ORDER — TECHNETIUM TC 99M MEDRONATE IV KIT
20.0000 | PACK | Freq: Once | INTRAVENOUS | Status: AC
  Administered 2024-08-12: 21.1 via INTRAVENOUS

## 2024-08-13 ENCOUNTER — Other Ambulatory Visit: Payer: Self-pay | Admitting: Nurse Practitioner

## 2024-08-13 DIAGNOSIS — E042 Nontoxic multinodular goiter: Secondary | ICD-10-CM | POA: Diagnosis not present

## 2024-08-13 DIAGNOSIS — M810 Age-related osteoporosis without current pathological fracture: Secondary | ICD-10-CM | POA: Diagnosis not present

## 2024-08-13 DIAGNOSIS — I739 Peripheral vascular disease, unspecified: Secondary | ICD-10-CM | POA: Diagnosis not present

## 2024-08-13 DIAGNOSIS — I10 Essential (primary) hypertension: Secondary | ICD-10-CM | POA: Diagnosis not present

## 2024-08-17 ENCOUNTER — Other Ambulatory Visit: Payer: Self-pay | Admitting: Family Medicine

## 2024-08-17 DIAGNOSIS — Z006 Encounter for examination for normal comparison and control in clinical research program: Secondary | ICD-10-CM

## 2024-08-24 ENCOUNTER — Ambulatory Visit
Admission: RE | Admit: 2024-08-24 | Discharge: 2024-08-24 | Disposition: A | Source: Ambulatory Visit | Attending: Family Medicine | Admitting: Family Medicine

## 2024-08-24 ENCOUNTER — Ambulatory Visit
Admission: RE | Admit: 2024-08-24 | Discharge: 2024-08-24 | Disposition: A | Discharge status: Home / Self Care | Source: Ambulatory Visit | Attending: Family Medicine | Admitting: Family Medicine

## 2024-08-24 DIAGNOSIS — Z006 Encounter for examination for normal comparison and control in clinical research program: Secondary | ICD-10-CM

## 2024-08-26 ENCOUNTER — Ambulatory Visit
Admission: RE | Admit: 2024-08-26 | Discharge: 2024-08-26 | Disposition: A | Discharge status: Home / Self Care | Source: Ambulatory Visit | Attending: Nurse Practitioner | Admitting: Nurse Practitioner

## 2024-08-26 DIAGNOSIS — E042 Nontoxic multinodular goiter: Secondary | ICD-10-CM | POA: Diagnosis not present

## 2024-08-27 ENCOUNTER — Encounter: Payer: Self-pay | Admitting: Nurse Practitioner

## 2024-08-27 DIAGNOSIS — E042 Nontoxic multinodular goiter: Secondary | ICD-10-CM

## 2024-09-02 DIAGNOSIS — M47816 Spondylosis without myelopathy or radiculopathy, lumbar region: Secondary | ICD-10-CM | POA: Diagnosis not present

## 2024-09-02 DIAGNOSIS — M19049 Primary osteoarthritis, unspecified hand: Secondary | ICD-10-CM | POA: Diagnosis not present

## 2024-09-02 DIAGNOSIS — M47812 Spondylosis without myelopathy or radiculopathy, cervical region: Secondary | ICD-10-CM | POA: Diagnosis not present

## 2024-09-02 DIAGNOSIS — G894 Chronic pain syndrome: Secondary | ICD-10-CM | POA: Diagnosis not present

## 2024-09-20 DIAGNOSIS — M25562 Pain in left knee: Secondary | ICD-10-CM | POA: Diagnosis not present

## 2024-09-20 DIAGNOSIS — Z96652 Presence of left artificial knee joint: Secondary | ICD-10-CM | POA: Diagnosis not present

## 2024-10-04 ENCOUNTER — Other Ambulatory Visit

## 2024-10-12 ENCOUNTER — Ambulatory Visit
Admission: RE | Admit: 2024-10-12 | Discharge: 2024-10-12 | Disposition: A | Discharge status: Home / Self Care | Source: Ambulatory Visit | Attending: Emergency Medicine | Admitting: Emergency Medicine

## 2024-10-12 DIAGNOSIS — R9389 Abnormal findings on diagnostic imaging of other specified body structures: Secondary | ICD-10-CM

## 2024-10-21 ENCOUNTER — Encounter: Payer: Self-pay | Admitting: Emergency Medicine

## 2024-10-21 ENCOUNTER — Ambulatory Visit: Admitting: Emergency Medicine

## 2024-10-21 VITALS — BP 110/62 | HR 81 | Ht 63.75 in | Wt 151.2 lb

## 2024-10-21 DIAGNOSIS — R9389 Abnormal findings on diagnostic imaging of other specified body structures: Secondary | ICD-10-CM

## 2024-10-21 DIAGNOSIS — R918 Other nonspecific abnormal finding of lung field: Secondary | ICD-10-CM | POA: Diagnosis not present

## 2024-10-21 DIAGNOSIS — Z87891 Personal history of nicotine dependence: Secondary | ICD-10-CM

## 2024-10-21 NOTE — Assessment & Plan Note (Signed)
 Again her CT chest does not show any interval change in her very subtle bilateral ground glass perihilar opacities.  Etiology remains unclear but I do not believe this is manifesting itself clinically.  There is been no interval change going back to 2022.  At this point I do not think we need to keep doing serial imaging.  We should certainly repeat a CT chest if she develops either respiratory or constitutional symptoms that would be consistent with an inflammatory process, infectious process, etc.  She will follow-up with me as needed

## 2024-10-21 NOTE — Patient Instructions (Signed)
 We reviewed your CT scan of the chest from 10/12/2024 today.  The areas of subtle haziness on your scan have remained stable going back to 2022.  In absence of symptoms I do not think we need to keep doing serial scans. If you develop any new respiratory symptoms or any constitutional symptoms like fever, chills, sweats, fatigue, then please follow-up so we can decide whether there would be value in repeating your CT scan again.

## 2024-10-21 NOTE — Progress Notes (Signed)
 "  Subjective:    Patient ID: [name], female    DOB: [birth year], 74 y.o.   MRN: [id number]  HPI   ROV 10/21/2024 --74 year old woman with minimal tobacco history, hypertension, OSA on CPAP, bilateral ground glass changes on CT scan of the chest with some waxing and waning characteristics.  We have been following serial imaging.  To date we have deferred bronchoscopy. She is compliant w CPAP She has some cough in the am, then is good through the day. She has good functional capacity,   CT scan of the chest 10/12/2024 reviewed by me, shows persistent waxing waning perihilar predominant peribronchovascular ground glass.  Unchanged compared with 1 year ago.  Review of Systems As per HPI  Past Medical History:  Diagnosis Date   Anxiety    Arthritis KNEES AND HANDS   Bladder tumor    benign   Blood transfusion    DDD (degenerative disc disease)    DDD (degenerative disc disease), lumbar    Hypertension    Normal cardiac stress test 08-10-2004   OSA on CPAP    uses cpap, pt does not know settings   Urgency of urination      Family History  Problem Relation Age of Onset   Cancer Mother        colon   Hypertension Mother    Heart murmur Mother    Cancer Father        lung   Hypertension Father    Diabetes Father    Cancer Brother        rectal     Social History   Socioeconomic History   Marital status: Married    Spouse name: Ted   Number of children: 2   Years of education: 13   Highest education level: Not on file  Occupational History    Comment: [employer]  Tobacco Use   Smoking status: Former    Current packs/day: 0.00    Average packs/day: 0.3 packs/day    Types: Cigarettes    Quit date: 06/25/1980    Years since quitting: 44.3   Smokeless tobacco: Never  Substance and Sexual Activity   Alcohol use: Yes    Alcohol/week: 1.0 standard drink of alcohol    Types: 1 Glasses of wine per week    Comment: occasional   Drug use: No   Sexual activity: Yes     Birth control/protection: Post-menopausal  Other Topics Concern   Not on file  Social History Narrative   Lives at home with spouse   Caffeine - coffee, 1 cup daily   Social Drivers of Health   Tobacco Use: Medium Risk (10/21/2024)   Patient History    Smoking Tobacco Use: Former    Smokeless Tobacco Use: Never    Passive Exposure: Not on Actuary Strain: Not on file  Food Insecurity: Low Risk (08/13/2023)   Received from [facility]   Epic    Within the past 12 months, you worried that your food would run out before you got money to buy more: Never true    Within the past 12 months, the food you bought just didn't last and you didn't have money to get more. : Never true  Transportation Needs: No Transportation Needs (08/13/2023)   Received from [facility]    In the past 12 months, has lack of reliable transportation kept you from medical appointments, meetings, work or from getting things needed for daily living? :  No  Physical Activity: Not on file  Stress: Not on file  Social Connections: Not on file  Intimate Partner Violence: Not on file  Depression (EYV7-0): Not on file  Alcohol Screen: Not on file  Housing: Low Risk (08/13/2023)   Received from [facility]   Epic    What is your living situation today?: I have a steady place to live    Think about the place you live. Do you have problems with any of the following? Choose all that apply:: None/None on this list  Utilities: Low Risk (08/13/2023)   Received from [facility]   Utilities    In the past 12 months has the electric, gas, oil, or water  company threatened to shut off services in your home? : No  Health Literacy: Not on file     Allergies  Allergen Reactions   Vancomycin  Rash     Outpatient Medications Prior to Visit  Medication Sig Dispense Refill   amLODipine  (NORVASC ) 10 MG tablet Take 1 tablet by mouth daily.     celecoxib  (CELEBREX ) 200 MG capsule Take 1  capsule (200 mg total) by mouth every 12 (twelve) hours. 60 capsule 0   cholecalciferol (VITAMIN D) 1000 units tablet Take 1,000 Units by mouth every morning.     diclofenac Sodium (VOLTAREN) 1 % GEL Apply 4 g topically 3 (three) times daily as needed.     HYDROcodone -acetaminophen  (NORCO/VICODIN) 5-325 MG tablet Take by mouth as needed.     Multiple Vitamin (MULTIVITAMIN WITH MINERALS) TABS tablet Take 1 tablet by mouth every morning.     rOPINIRole (REQUIP) 0.5 MG tablet Take 0.5 mg by mouth 2 (two) times daily.     rosuvastatin (CRESTOR) 5 MG tablet Take 5 mg by mouth daily.     valsartan -hydrochlorothiazide  (DIOVAN -HCT) 160-12.5 MG tablet Take 1 tablet by mouth daily.     zolpidem  (AMBIEN  CR) 12.5 MG CR tablet Take 12.5 mg by mouth at bedtime as needed for sleep.      ALPRAZolam  (XANAX ) 0.25 MG tablet Take 0.25 mg by mouth 2 (two) times daily as needed. (Patient not taking: Reported on 10/21/2024)     amLODIPine -Valsartan -HCTZ 10-320-25 MG TABS Take 1 tablet by mouth every morning. (Patient not taking: Reported on 10/23/2023)     cyclobenzaprine  (FLEXERIL ) 10 MG tablet Take 1 tablet by mouth every 8 (eight) hours as needed. (Patient not taking: Reported on 10/21/2024)     estrogens , conjugated, (PREMARIN ) 0.45 MG tablet Take 0.45 mg by mouth every morning. Reported on 01/01/2016 (Patient not taking: Reported on 10/21/2024)     sertraline  (ZOLOFT ) 50 MG tablet Take 50 mg by mouth every morning. (Patient not taking: Reported on 10/21/2024)     venlafaxine XR (EFFEXOR-XR) 75 MG 24 hr capsule venlafaxine ER 75 mg capsule,extended release 24 hr  TAKE 1 CAPSULE BY MOUTH EVERY DAY WITH FOOD (Patient not taking: Reported on 10/21/2024)     No facility-administered medications prior to visit.         Objective:   Physical Exam Vitals:   10/21/24 1351  BP: 110/62  Pulse: 81  SpO2: 99%  Weight: 151 lb 3.2 oz (68.6 kg)  Height: 5' 3.75 (1.619 m)   Gen: Pleasant, well-nourished, in no distress,  normal  affect  ENT: No lesions,  mouth clear,  oropharynx clear, no postnasal drip  Neck: No JVD, no stridor  Lungs: No use of accessory muscles, no crackles or wheezing on normal respiration, no wheeze on forced expiration  Cardiovascular: RRR, heart sounds normal, no murmur or gallops, no peripheral edema  Musculoskeletal: No deformities, no cyanosis or clubbing  Neuro: alert, awake, non focal  Skin: Warm, no lesions or rash      Assessment & Plan:   Abnormal CT of the chest Again her CT chest does not show any interval change in her very subtle bilateral ground glass perihilar opacities.  Etiology remains unclear but I do not believe this is manifesting itself clinically.  There is been no interval change going back to 2022.  At this point I do not think we need to keep doing serial imaging.  We should certainly repeat a CT chest if she develops either respiratory or constitutional symptoms that would be consistent with an inflammatory process, infectious process, etc.  She will follow-up with me as needed     Lamar Chris, MD, PhD 10/21/2024, 2:07 PM  [facility] [phone] or if no answer before 7:00PM call [phone] For any issues after 7:00PM please call eLink [phone]  "

## 2024-11-11 ENCOUNTER — Inpatient Hospital Stay (HOSPITAL_COMMUNITY): Admit: 2024-11-11 | Admitting: Orthopedic Surgery

## 2024-11-11 SURGERY — TOTAL KNEE REVISION
Anesthesia: Spinal | Site: Knee | Laterality: Left
